# Patient Record
Sex: Male | Born: 1965 | Race: Black or African American | Hispanic: No | Marital: Single | Smoking: Former smoker
Health system: Southern US, Community
[De-identification: ages and names within clinical notes are randomized; demographics above are authoritative.]

## PROBLEM LIST (undated history)

## (undated) DIAGNOSIS — E119 Type 2 diabetes mellitus without complications: Secondary | ICD-10-CM

## (undated) DIAGNOSIS — E785 Hyperlipidemia, unspecified: Secondary | ICD-10-CM

## (undated) DIAGNOSIS — I10 Essential (primary) hypertension: Secondary | ICD-10-CM

## (undated) DIAGNOSIS — F32A Depression, unspecified: Secondary | ICD-10-CM

## (undated) HISTORY — DX: Type 2 diabetes mellitus without complications: E11.9

## (undated) HISTORY — PX: CIRCUMCISION: SUR203

## (undated) HISTORY — DX: Hyperlipidemia, unspecified: E78.5

## (undated) HISTORY — DX: Essential (primary) hypertension: I10

## (undated) HISTORY — DX: Depression, unspecified: F32.A

---

## 2015-01-18 ENCOUNTER — Encounter: Payer: Self-pay | Admitting: Gastroenterology

## 2015-03-08 ENCOUNTER — Ambulatory Visit: Payer: Self-pay | Admitting: *Deleted

## 2015-03-10 ENCOUNTER — Encounter: Payer: Self-pay | Admitting: Emergency Medicine

## 2015-03-22 ENCOUNTER — Encounter: Payer: Self-pay | Admitting: Gastroenterology

## 2016-11-05 ENCOUNTER — Ambulatory Visit (INDEPENDENT_AMBULATORY_CARE_PROVIDER_SITE_OTHER): Payer: BLUE CROSS/BLUE SHIELD | Admitting: Internal Medicine

## 2016-11-05 ENCOUNTER — Encounter: Payer: Self-pay | Admitting: Internal Medicine

## 2016-11-05 VITALS — BP 138/90 | HR 63 | Temp 97.4°F

## 2016-11-05 DIAGNOSIS — E785 Hyperlipidemia, unspecified: Secondary | ICD-10-CM

## 2016-11-05 DIAGNOSIS — Z125 Encounter for screening for malignant neoplasm of prostate: Secondary | ICD-10-CM | POA: Diagnosis not present

## 2016-11-05 DIAGNOSIS — I10 Essential (primary) hypertension: Secondary | ICD-10-CM | POA: Diagnosis not present

## 2016-11-05 DIAGNOSIS — E119 Type 2 diabetes mellitus without complications: Secondary | ICD-10-CM | POA: Diagnosis not present

## 2016-11-05 DIAGNOSIS — Z0000 Encounter for general adult medical examination without abnormal findings: Secondary | ICD-10-CM

## 2016-11-05 LAB — CBC WITH DIFFERENTIAL/PLATELET
BASOS ABS: 0 {cells}/uL (ref 0–200)
Basophils Relative: 0 %
EOS PCT: 2 %
Eosinophils Absolute: 136 cells/uL (ref 15–500)
HCT: 43.4 % (ref 38.5–50.0)
HEMOGLOBIN: 14.3 g/dL (ref 13.2–17.1)
Lymphocytes Relative: 50 %
Lymphs Abs: 3400 cells/uL (ref 850–3900)
MCH: 27.3 pg (ref 27.0–33.0)
MCHC: 32.9 g/dL (ref 32.0–36.0)
MCV: 83 fL (ref 80.0–100.0)
MONOS PCT: 6 %
MPV: 11.5 fL (ref 7.5–12.5)
Monocytes Absolute: 408 cells/uL (ref 200–950)
NEUTROS ABS: 2856 {cells}/uL (ref 1500–7800)
Neutrophils Relative %: 42 %
PLATELETS: 170 10*3/uL (ref 140–400)
RBC: 5.23 MIL/uL (ref 4.20–5.80)
RDW: 14 % (ref 11.0–15.0)
WBC: 6.8 10*3/uL (ref 3.8–10.8)

## 2016-11-05 LAB — HEMOCCULT GUIAC POC 1CARD (OFFICE): Fecal Occult Blood, POC: POSITIVE — AB

## 2016-11-05 LAB — POCT URINALYSIS DIPSTICK
BILIRUBIN UA: NEGATIVE
Blood, UA: NEGATIVE
GLUCOSE UA: NEGATIVE
Ketones, UA: NEGATIVE
Leukocytes, UA: NEGATIVE
NITRITE UA: NEGATIVE
Protein, UA: NEGATIVE
Spec Grav, UA: 1.015 (ref 1.010–1.025)
Urobilinogen, UA: 0.2 E.U./dL
pH, UA: 6 (ref 5.0–8.0)

## 2016-11-05 MED ORDER — TRESIBA FLEXTOUCH 100 UNIT/ML ~~LOC~~ SOPN: 30.0000 [IU] | PEN_INJECTOR | Freq: Every day | SUBCUTANEOUS | 11 refills | Status: DC

## 2016-11-05 MED ORDER — ASPIRIN EC 81 MG PO TBEC: 81.0000 mg | DELAYED_RELEASE_TABLET | Freq: Every day | ORAL | 3 refills | Status: DC

## 2016-11-05 MED ORDER — ATORVASTATIN CALCIUM 80 MG PO TABS: 80.0000 mg | ORAL_TABLET | Freq: Every day | ORAL | 3 refills | Status: DC

## 2016-11-05 MED ORDER — LISINOPRIL-HYDROCHLOROTHIAZIDE 20-25 MG PO TABS
1.0000 | ORAL_TABLET | Freq: Every day | ORAL | 3 refills | Status: DC
Start: 2016-11-05 — End: 2017-06-07

## 2016-11-05 NOTE — Patient Instructions (Signed)
Please keep a record of  home blood pressure readings and bring with you at next visit. Return in 2 weeks. Labs drawn and pending. Please have diabetic eye exam.

## 2016-11-05 NOTE — Progress Notes (Signed)
Subjective:    Patient ID: Troy Gordon, male    DOB: 1965/08/29, 51 y.o.   MRN: [id number]  HPI  First visit for this 51 year old Black Male who moved here about 2 years ago from [place].  He has a history of hyperlipidemia, hypertension and  type 2 diabetes mellitus. Doesn't check Accu-Cheks on a regular basis. Does it about every other day. Talked with him about this. Written prescription for new home glucose monitor and lancets.  Says the last time he had hemoglobin A1c drawn, he thought it was in the 9% range. Doesn't bring any old records with him today. Says blood pressure is much better at home and he takes it several times a week. I've asked him to keep up with this on a daily basis and return in 2 weeks with blood pressure readings.  Medications include Zestoretic 20/25 daily, Lipitor 80 mg daily, Tresiba Flex touch 30 units daily at 10 PM. Also takes 81 mg of aspirin daily.  Social history: He is divorced. Has a high school education. Resides alone. 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 He works 12 hour days 5 days a week from 3 PM to 3 AM. He tries to get exercise with weight training and the [employer]. He does not smoke or consume alcohol nor use illicit drugs. One son who is an adult 61.  Family history: He was raised by someone other than his parents. He doesn't know much about his parents' family history is said they are both living and have history of hypertension. He does not think that either one has diabetes. Maternal grandmother did have diabetes. Has one brother apparently in good health and 6 sisters. One sister may have had cancer but he doesn't know what type. She also has arthritis. Another sister is overweight. Not much else known about his sisters. Son is in good health.  Was diagnosed with diabetes around 2009. No history of hospitalizations or operations. No known drug  allergies.    Review of Systems  Constitutional: Negative.   Eyes: Negative.   Respiratory: Negative.   Cardiovascular: Negative.   Gastrointestinal: Negative.   Genitourinary: Negative.   Musculoskeletal: Positive for back pain.  Neurological: Negative.   Psychiatric/Behavioral: Negative.        Objective:   Physical Exam  Constitutional: He is oriented to person, place, and time. He appears well-developed and well-nourished. No distress.  HENT:  Head: Normocephalic and atraumatic.  Right Ear: External ear normal.  Left Ear: External ear normal.  Mouth/Throat: Oropharynx is clear and moist. No oropharyngeal exudate.  Eyes: Pupils are equal, round, and reactive to light. Conjunctivae and EOM are normal. Right eye exhibits no discharge. Left eye exhibits no discharge. No scleral icterus.  Neck: Neck supple. No JVD present. No thyromegaly present.  Cardiovascular: Normal rate, regular rhythm, normal heart sounds and intact distal pulses.   No murmur heard. Pulmonary/Chest: Breath sounds normal. No respiratory distress. He has no wheezes. He has no rales. He exhibits no tenderness.  Abdominal: Soft. Bowel sounds are normal. He exhibits no distension and no mass. There is no tenderness. There is no rebound and no guarding.  Genitourinary:  Genitourinary Comments: Prostate normal without nodules  Musculoskeletal: He exhibits no edema.  Lymphadenopathy:    He has no cervical adenopathy.  Neurological: He is alert and oriented to person, place, and time. He has normal reflexes. No  cranial nerve deficit. Coordination normal.  Skin: Skin is warm and dry. No rash noted. He is not diaphoretic.  Psychiatric: He has a normal mood and affect. His behavior is normal. Judgment and thought content normal.  Vitals reviewed.         Assessment & Plan:  Type 2 diabetes mellitus on Tresiba- Hgb AIC pending  Essential hypertension-monitor over the next 2 weeks at home and return for  follow-up and bring home readings  Hyperlipidemia-fasting labs drawn and pending along with hemoglobin A1c and urine for microalbuminuria  Plan: He declines flu vaccine. Return in 2 weeks with office visit and bring blood pressure readings from home. Also bring Accu-Chek readings. He may benefit from endocrinology consult. Recommend annual diabetic eye exam.May need adjustment in BP med. Reminded about diabetic eye exam. Ask about prevnar 13 at next visit. Declines flu vaccine.

## 2016-11-06 LAB — COMPLETE METABOLIC PANEL WITH GFR
ALT: 15 U/L (ref 9–46)
AST: 15 U/L (ref 10–35)
Albumin: 3.9 g/dL (ref 3.6–5.1)
Alkaline Phosphatase: 69 U/L (ref 40–115)
BILIRUBIN TOTAL: 0.4 mg/dL (ref 0.2–1.2)
BUN: 24 mg/dL (ref 7–25)
CO2: 27 mmol/L (ref 20–32)
Calcium: 9.2 mg/dL (ref 8.6–10.3)
Chloride: 102 mmol/L (ref 98–110)
Creat: 1.32 mg/dL (ref 0.70–1.33)
GFR, EST NON AFRICAN AMERICAN: 62 mL/min (ref >=60)
GFR, Est African American: 71 mL/min (ref >=60)
GLUCOSE: 120 mg/dL — AB (ref 65–99)
POTASSIUM: 4 mmol/L (ref 3.5–5.3)
SODIUM: 141 mmol/L (ref 135–146)
TOTAL PROTEIN: 6.7 g/dL (ref 6.1–8.1)

## 2016-11-06 LAB — LIPID PANEL
Cholesterol: 237 mg/dL — ABNORMAL HIGH (ref <200)
HDL: 42 mg/dL (ref >40)
LDL CALC: 153 mg/dL — AB (ref <100)
Total CHOL/HDL Ratio: 5.6 Ratio — ABNORMAL HIGH (ref <5.0)
Triglycerides: 210 mg/dL — ABNORMAL HIGH (ref <150)
VLDL: 42 mg/dL — ABNORMAL HIGH (ref <30)

## 2016-11-06 LAB — MICROALBUMIN / CREATININE URINE RATIO
Creatinine, Urine: 169 mg/dL (ref 20–370)
Microalb Creat Ratio: 8 mcg/mg creat (ref <30)
Microalb, Ur: 1.4 mg/dL

## 2016-11-06 LAB — PSA: PSA: 0.4 ng/mL (ref <=4.0)

## 2016-11-06 LAB — HEMOGLOBIN A1C
HEMOGLOBIN A1C: 9.1 % — AB (ref <5.7)
Mean Plasma Glucose: 214 mg/dL

## 2016-11-09 ENCOUNTER — Encounter: Payer: Self-pay | Admitting: Internal Medicine

## 2016-11-20 ENCOUNTER — Ambulatory Visit (INDEPENDENT_AMBULATORY_CARE_PROVIDER_SITE_OTHER): Payer: BLUE CROSS/BLUE SHIELD | Admitting: Internal Medicine

## 2016-11-20 ENCOUNTER — Encounter: Payer: Self-pay | Admitting: Internal Medicine

## 2016-11-20 VITALS — BP 138/96 | HR 68 | Temp 97.5°F | Wt 201.0 lb

## 2016-11-20 DIAGNOSIS — Z794 Long term (current) use of insulin: Secondary | ICD-10-CM

## 2016-11-20 DIAGNOSIS — I10 Essential (primary) hypertension: Secondary | ICD-10-CM

## 2016-11-20 DIAGNOSIS — E119 Type 2 diabetes mellitus without complications: Secondary | ICD-10-CM

## 2016-11-20 DIAGNOSIS — E785 Hyperlipidemia, unspecified: Secondary | ICD-10-CM | POA: Diagnosis not present

## 2016-11-20 DIAGNOSIS — E1165 Type 2 diabetes mellitus with hyperglycemia: Secondary | ICD-10-CM | POA: Insufficient documentation

## 2016-11-20 MED ORDER — AMLODIPINE BESYLATE 5 MG PO TABS: 5.0000 mg | ORAL_TABLET | Freq: Every day | ORAL | 3 refills | Status: DC

## 2016-11-20 NOTE — Patient Instructions (Signed)
Add Norvasc 5 mg daily to Prinzide. Return in 3-4 weeks. Referral made to endocrinologist. Flu vaccine declined.

## 2016-11-20 NOTE — Progress Notes (Signed)
   Subjective:    Patient ID: Troy Gordon, male    DOB: 08/13/1965, 51 y.o.   MRN: [id number]  HPI  51  year old Male seen here as a new patient for the first time on August 27. Hemoglobin A1c was 9.1% at that time. He's been drinking too smoothies a day that he purchases from [employer] and prepare's himself. He does not know how many calories are in the smoothies. We went over his dietary history. He tries to eat fairly well with meat and vegetables. Knows to avoid bread. I'm not sure he realized that too much fruit could worsen his diabetes. He will check on the calories contained in the smoothies. He needs a new home glucose monitor. We gave him a written prescription at last visit but drugstore would not fill that prescription. We are calling to check on that.  He declines flu vaccine  His blood pressure control could be a bit better. It is 138/96 today. He is on Prinzide 20/25 daily.  Review of Systemsseeing above-no new complaints      Objective:   Physical Exam Skin warm and dry. Neck is supple without JVD thyromegaly or bruits. Chest clear. Cardiac exam regular rate and rhythm normal S1 and S2. Extremities without edema        Assessment & Plan:    Type 2 diabetes mellitus-hemoglobin A1c 9.1% could be improved  Essential hypertension-add Norvasc 5 mg daily and follow-up in 3-4 weeks.  Hyperlipidemia-he is on maximum Lipitor therapy  Plan: Referral to endocrinologist. Follow-up on blood pressure here in 3-4 weeks. Declines flu vaccine. Reminded about diabetic eye exam.

## 2016-11-30 LAB — HM DIABETES EYE EXAM

## 2016-12-20 ENCOUNTER — Ambulatory Visit: Payer: BLUE CROSS/BLUE SHIELD | Admitting: Internal Medicine

## 2016-12-28 ENCOUNTER — Encounter: Payer: Self-pay | Admitting: Internal Medicine

## 2016-12-28 ENCOUNTER — Ambulatory Visit (INDEPENDENT_AMBULATORY_CARE_PROVIDER_SITE_OTHER): Payer: BLUE CROSS/BLUE SHIELD | Admitting: Internal Medicine

## 2016-12-28 VITALS — BP 124/80 | HR 70 | Temp 97.4°F | Wt 200.0 lb

## 2016-12-28 DIAGNOSIS — E119 Type 2 diabetes mellitus without complications: Secondary | ICD-10-CM

## 2016-12-28 DIAGNOSIS — I10 Essential (primary) hypertension: Secondary | ICD-10-CM | POA: Diagnosis not present

## 2016-12-28 DIAGNOSIS — E785 Hyperlipidemia, unspecified: Secondary | ICD-10-CM

## 2016-12-28 NOTE — Progress Notes (Signed)
   Subjective:    Patient ID: Troy Gordon, male    DOB: 03/16/1965, 51 y.o.   MRN: [id number]  HPI For follow up on on hyperlipidemia and essential hypertension. Last visit, amlodipine was added to his hypertensive regimen. His blood pressure is much improved.  With regard to his diet, he stopped preparing smoothies. Thinks that they had a lot of carbohydrates. He's changed the way he is eating a bit and trying to get plenty of proteins and less calories.  On his initial lab work total cholesterol was 237 and triglycerides 210 despite maximum dose of statin medication. LDL cholesterol was 153. He has appointment soon to see Dr. Lucianne MussKumar.His Hemoglobin AIC was 9.1% in August.  He had recent eye exam that showed some mild hypertensive retinopathy but no diabetic retinopathy.  He declines flu vaccine    Review of Systems see above     Objective:   Physical Exam Blood pressure is excellent at 124/80 pulse is 70. Chest clear. Cardiac exam regular rate and rhythm. Extremities without edema.       Assessment & Plan:  Essential hypertension  Diabetes mellitus  Hyperlipidemia  Plan: Continue current medications. See Dr. Lucianne MussKumar. Return late February 2019. Asked patient to complete 3 Hemoccult cards at home off aspirin therapy.

## 2016-12-28 NOTE — Patient Instructions (Addendum)
I am pleased with blood pressure results. Continue same medications. See Dr. Lucianne MussKumar for diabetic evaluation and return here in February for six-month recheck. Patient declines flu vaccine. Asked patient to complete 3 Hemoccult cards at home off aspirin therapy.

## 2017-01-10 ENCOUNTER — Ambulatory Visit (INDEPENDENT_AMBULATORY_CARE_PROVIDER_SITE_OTHER): Payer: BLUE CROSS/BLUE SHIELD | Admitting: Internal Medicine

## 2017-01-10 DIAGNOSIS — Z1211 Encounter for screening for malignant neoplasm of colon: Secondary | ICD-10-CM

## 2017-01-10 LAB — HEMOCCULT GUIAC POC 1CARD (OFFICE)
FECAL OCCULT BLD: NEGATIVE
FECAL OCCULT BLD: NEGATIVE
Fecal Occult Blood, POC: NEGATIVE

## 2017-01-10 NOTE — Patient Instructions (Signed)
Negative stool cards

## 2017-01-10 NOTE — Progress Notes (Signed)
Stool cards received in mail today and all were negative.

## 2017-01-15 ENCOUNTER — Ambulatory Visit: Payer: Self-pay | Admitting: Endocrinology

## 2017-05-03 ENCOUNTER — Other Ambulatory Visit: Payer: BLUE CROSS/BLUE SHIELD | Admitting: Internal Medicine

## 2017-05-06 ENCOUNTER — Ambulatory Visit: Payer: BLUE CROSS/BLUE SHIELD | Admitting: Internal Medicine

## 2017-05-28 ENCOUNTER — Other Ambulatory Visit: Payer: Self-pay | Admitting: Internal Medicine

## 2017-05-28 DIAGNOSIS — I10 Essential (primary) hypertension: Secondary | ICD-10-CM

## 2017-05-28 DIAGNOSIS — E119 Type 2 diabetes mellitus without complications: Secondary | ICD-10-CM

## 2017-06-04 ENCOUNTER — Other Ambulatory Visit: Payer: BLUE CROSS/BLUE SHIELD | Admitting: Internal Medicine

## 2017-06-04 DIAGNOSIS — E119 Type 2 diabetes mellitus without complications: Secondary | ICD-10-CM | POA: Diagnosis not present

## 2017-06-04 DIAGNOSIS — I10 Essential (primary) hypertension: Secondary | ICD-10-CM | POA: Diagnosis not present

## 2017-06-05 LAB — MICROALBUMIN / CREATININE URINE RATIO
CREATININE, URINE: 136 mg/dL (ref 20–320)
MICROALB UR: 1.9 mg/dL
MICROALB/CREAT RATIO: 14 ug/mg{creat} (ref <30)

## 2017-06-05 LAB — LIPID PANEL
CHOLESTEROL: 281 mg/dL — AB (ref <200)
HDL: 51 mg/dL (ref >40)
LDL Cholesterol (Calc): 194 mg/dL (calc) — ABNORMAL HIGH
Non-HDL Cholesterol (Calc): 230 mg/dL (calc) — ABNORMAL HIGH (ref <130)
Total CHOL/HDL Ratio: 5.5 (calc) — ABNORMAL HIGH (ref <5.0)
Triglycerides: 186 mg/dL — ABNORMAL HIGH (ref <150)

## 2017-06-05 LAB — HEPATIC FUNCTION PANEL
AG Ratio: 1.4 (calc) (ref 1.0–2.5)
ALKALINE PHOSPHATASE (APISO): 65 U/L (ref 40–115)
ALT: 23 U/L (ref 9–46)
AST: 20 U/L (ref 10–35)
Albumin: 4.2 g/dL (ref 3.6–5.1)
BILIRUBIN INDIRECT: 0.4 mg/dL (ref 0.2–1.2)
Bilirubin, Direct: 0.1 mg/dL (ref 0.0–0.2)
Globulin: 3 g/dL (calc) (ref 1.9–3.7)
TOTAL PROTEIN: 7.2 g/dL (ref 6.1–8.1)
Total Bilirubin: 0.5 mg/dL (ref 0.2–1.2)

## 2017-06-05 LAB — HEMOGLOBIN A1C
HEMOGLOBIN A1C: 7.4 %{Hb} — AB (ref <5.7)
MEAN PLASMA GLUCOSE: 166 (calc)
eAG (mmol/L): 9.2 (calc)

## 2017-06-07 ENCOUNTER — Ambulatory Visit (INDEPENDENT_AMBULATORY_CARE_PROVIDER_SITE_OTHER): Payer: BLUE CROSS/BLUE SHIELD | Admitting: Internal Medicine

## 2017-06-07 ENCOUNTER — Encounter: Payer: Self-pay | Admitting: Internal Medicine

## 2017-06-07 VITALS — BP 110/80 | HR 58 | Ht 68.0 in | Wt 198.0 lb

## 2017-06-07 DIAGNOSIS — E782 Mixed hyperlipidemia: Secondary | ICD-10-CM | POA: Diagnosis not present

## 2017-06-07 DIAGNOSIS — E785 Hyperlipidemia, unspecified: Secondary | ICD-10-CM

## 2017-06-07 DIAGNOSIS — I10 Essential (primary) hypertension: Secondary | ICD-10-CM

## 2017-06-07 DIAGNOSIS — E1169 Type 2 diabetes mellitus with other specified complication: Secondary | ICD-10-CM | POA: Insufficient documentation

## 2017-06-07 DIAGNOSIS — E119 Type 2 diabetes mellitus without complications: Secondary | ICD-10-CM

## 2017-06-07 MED ORDER — LISINOPRIL-HYDROCHLOROTHIAZIDE 20-25 MG PO TABS: 1.0000 | ORAL_TABLET | Freq: Every day | ORAL | 0 refills | Status: DC

## 2017-06-07 MED ORDER — ATORVASTATIN CALCIUM 80 MG PO TABS: 80.0000 mg | ORAL_TABLET | Freq: Every day | ORAL | 1 refills | Status: DC

## 2017-06-07 NOTE — Patient Instructions (Signed)
I am pleased with her hemoglobin A1c results.  Please work on diet and exercise and follow-up with lipid panel in 3 months.  No change in medications.

## 2017-06-07 NOTE — Progress Notes (Signed)
   Subjective:    Patient ID: Troy Gordon, male    DOB: 07/12/1965, 52 y.o.   MRN: [id number]  HPI 52 year old Black Male for hyperlipidemia and in today to follow-up diabetes mellitus.  He is now cooking more for himself rather than eating out.  His hemoglobin A1c has improved from 9.1% to 7.4% which is excellent.  However with regard to hyperlipidemia, he is on Lipitor 80 mg daily.  Total cholesterol has increased from 237-281.  Triglycerides have decreased from 210-186 and LDL cholesterol has increased from 153-194.  We went over his diet and I am not sure other than using coconut oil and eating some fried eggs what is causing this problem.  It was certainly better several months ago but not normal.  He spent furloughed at work and that may continue some throughout the summer.  He is joined a gym and is trying to get more exercise.  For now, we are going to leave him on same medications and he will follow-up in 3 months with lipid panel only.  His physical exam is due in 6 months.  His blood pressure is excellent at 110/80.    Review of Systems see above no new complaints     Objective:   Physical Exam Skin warm and dry.  Nodes none.  Neck is supple.  No JVD thyromegaly or carotid bruits.  Chest clear.  Cardiac exam regular rate and rhythm normal S1 and S2.  Extremities without edema.       Assessment & Plan:  Type 2 diabetes mellitus without complication-improved considerably  Hyperlipidemia on maximum dose of Lipitor is actually a bit worse and it is not clear to me other than some dietary indiscretion what is causing this.  He needs to get more exercise.  His weight is pretty good right now.  BMI is 30.11.  Recheck in 3 months.  Health maintenance-he declines pneumococcal immunizations  Plan: He will return in 3 months for lipid panel only and return in 6 months for his annual physical exam.

## 2017-08-26 ENCOUNTER — Other Ambulatory Visit: Payer: Self-pay | Admitting: Internal Medicine

## 2017-08-26 DIAGNOSIS — E782 Mixed hyperlipidemia: Secondary | ICD-10-CM

## 2017-09-02 ENCOUNTER — Ambulatory Visit (INDEPENDENT_AMBULATORY_CARE_PROVIDER_SITE_OTHER): Payer: BLUE CROSS/BLUE SHIELD | Admitting: Internal Medicine

## 2017-09-02 ENCOUNTER — Encounter: Payer: Self-pay | Admitting: Internal Medicine

## 2017-09-02 VITALS — BP 130/90 | HR 70 | Temp 98.1°F | Ht 68.0 in | Wt 198.0 lb

## 2017-09-02 DIAGNOSIS — I10 Essential (primary) hypertension: Secondary | ICD-10-CM | POA: Diagnosis not present

## 2017-09-02 DIAGNOSIS — E782 Mixed hyperlipidemia: Secondary | ICD-10-CM

## 2017-09-02 DIAGNOSIS — E119 Type 2 diabetes mellitus without complications: Secondary | ICD-10-CM

## 2017-09-02 DIAGNOSIS — E1169 Type 2 diabetes mellitus with other specified complication: Secondary | ICD-10-CM

## 2017-09-02 DIAGNOSIS — E785 Hyperlipidemia, unspecified: Secondary | ICD-10-CM

## 2017-09-02 LAB — LIPID PANEL
Cholesterol: 218 mg/dL — ABNORMAL HIGH (ref <200)
HDL: 44 mg/dL (ref >40)
LDL Cholesterol (Calc): 140 mg/dL (calc) — ABNORMAL HIGH
NON-HDL CHOLESTEROL (CALC): 174 mg/dL — AB (ref <130)
Total CHOL/HDL Ratio: 5 (calc) — ABNORMAL HIGH (ref <5.0)
Triglycerides: 198 mg/dL — ABNORMAL HIGH (ref <150)

## 2017-09-02 MED ORDER — TRESIBA FLEXTOUCH 100 UNIT/ML ~~LOC~~ SOPN
30.0000 [IU] | PEN_INJECTOR | Freq: Every day | SUBCUTANEOUS | 11 refills | Status: DC
Start: 2017-09-02 — End: 2017-10-03

## 2017-09-02 MED ORDER — AMLODIPINE BESYLATE 5 MG PO TABS: 5.0000 mg | ORAL_TABLET | Freq: Every day | ORAL | 0 refills | Status: DC

## 2017-09-02 MED ORDER — ATORVASTATIN CALCIUM 80 MG PO TABS: 80.0000 mg | ORAL_TABLET | Freq: Every day | ORAL | 1 refills | Status: DC

## 2017-09-02 MED ORDER — LISINOPRIL-HYDROCHLOROTHIAZIDE 20-25 MG PO TABS: 1.0000 | ORAL_TABLET | Freq: Every day | ORAL | 0 refills | Status: DC

## 2017-09-02 NOTE — Progress Notes (Signed)
   Subjective:    Patient ID: Troy Gordon, male    DOB: 02/12/1966, 52 y.o.   MRN: [id number]  HPI in today to follow-up on hyperlipidemia.  At last visit in March total cholesterol was 281 with triglycerides of 186 and an LDL cholesterol of 194.  He is on atorvastatin 80 mg daily.  He may have missed some doses around the time his labs were checked in March.  Says is been trying to eat better and taking his medication on a daily basis.  His blood pressure is elevated at 130/ on arrival90.  I have not changed his antihypertensive medication today.  Says his Accu-Cheks are running about 130.  He has follow-up appointment to address all of these issues in September.  He was mainly here today for follow-up on hyperlipidemia.    Review of Systems see above     Objective:   Physical Exam Skin warm and dry.  Chest clear to auscultation.  Cardiac exam regular rate and rhythm.Ext without edema. Blood pressure repeated was 130/88 in each arm.        Assessment & Plan:  Mixed hyperlipidemia  Plan: Lab work drawn and pending with further instructions to follow.  Keep appointment for follow-up in early September for CPE.

## 2017-09-02 NOTE — Patient Instructions (Signed)
Fasting lipid panel drawn with results pending.  Continue same antihypertensive medication.  Follow-up in September.

## 2017-09-05 ENCOUNTER — Telehealth: Payer: Self-pay | Admitting: Internal Medicine

## 2017-09-05 NOTE — Telephone Encounter (Signed)
Suggest he have fasting lipid panel and Hgb AIC week of July 8

## 2017-09-05 NOTE — Telephone Encounter (Signed)
Patient will come for fasting labs for Lipid, A1C on Tuesday, 7/9 @ 9:15 a.m.  Patient confirmed.

## 2017-09-05 NOTE — Telephone Encounter (Signed)
Patient called and states that he got confused on his labs.  He forgot that he had to fast on his lipids instead of his A1C.  He was eating up to 2 or 3 a.m.  He thinks this may have been the reason his labs were really messed up.  He says that he had really been dieting and exercising and feels his labs should've been much better.  Wants to know if you want him to come and repeat his labs?  He really would feel better if this was repeated with him fasting as he should've been.  He's sorry as he just got confused on this.    He will be on OT until after the week of July 4th.  The week of July 8th, he will be off all of that week and could come back in for repeat labs any time that week.    Phone #:  [phone]  Thank you.

## 2017-09-06 ENCOUNTER — Other Ambulatory Visit: Payer: Self-pay | Admitting: Internal Medicine

## 2017-09-06 DIAGNOSIS — E782 Mixed hyperlipidemia: Secondary | ICD-10-CM

## 2017-09-06 DIAGNOSIS — E785 Hyperlipidemia, unspecified: Secondary | ICD-10-CM

## 2017-09-06 DIAGNOSIS — E1169 Type 2 diabetes mellitus with other specified complication: Secondary | ICD-10-CM

## 2017-09-17 ENCOUNTER — Other Ambulatory Visit: Payer: BLUE CROSS/BLUE SHIELD | Admitting: Internal Medicine

## 2017-09-17 DIAGNOSIS — E782 Mixed hyperlipidemia: Secondary | ICD-10-CM | POA: Diagnosis not present

## 2017-09-17 DIAGNOSIS — E785 Hyperlipidemia, unspecified: Secondary | ICD-10-CM | POA: Diagnosis not present

## 2017-09-17 DIAGNOSIS — E1169 Type 2 diabetes mellitus with other specified complication: Secondary | ICD-10-CM

## 2017-09-18 LAB — LIPID PANEL
Cholesterol: 191 mg/dL (ref <200)
HDL: 45 mg/dL (ref >40)
LDL Cholesterol (Calc): 118 mg/dL (calc) — ABNORMAL HIGH
NON-HDL CHOLESTEROL (CALC): 146 mg/dL — AB (ref <130)
Total CHOL/HDL Ratio: 4.2 (calc) (ref <5.0)
Triglycerides: 167 mg/dL — ABNORMAL HIGH (ref <150)

## 2017-09-18 LAB — HEMOGLOBIN A1C
EAG (MMOL/L): 9 (calc)
HEMOGLOBIN A1C: 7.3 %{Hb} — AB (ref <5.7)
MEAN PLASMA GLUCOSE: 163 (calc)

## 2017-09-25 ENCOUNTER — Encounter: Payer: Self-pay | Admitting: Internal Medicine

## 2017-09-25 DIAGNOSIS — E119 Type 2 diabetes mellitus without complications: Secondary | ICD-10-CM | POA: Diagnosis not present

## 2017-09-25 LAB — HM DIABETES EYE EXAM

## 2017-10-03 ENCOUNTER — Other Ambulatory Visit: Payer: Self-pay

## 2017-10-03 MED ORDER — TRESIBA FLEXTOUCH 100 UNIT/ML ~~LOC~~ SOPN: 30.0000 [IU] | PEN_INJECTOR | Freq: Every day | SUBCUTANEOUS | 11 refills | Status: DC

## 2017-10-03 MED ORDER — LISINOPRIL-HYDROCHLOROTHIAZIDE 20-25 MG PO TABS: 1.0000 | ORAL_TABLET | Freq: Every day | ORAL | 3 refills | Status: DC

## 2017-10-03 MED ORDER — ATORVASTATIN CALCIUM 80 MG PO TABS: 80.0000 mg | ORAL_TABLET | Freq: Every day | ORAL | 3 refills | Status: DC

## 2017-11-15 ENCOUNTER — Other Ambulatory Visit: Payer: Self-pay | Admitting: Internal Medicine

## 2017-11-15 DIAGNOSIS — E782 Mixed hyperlipidemia: Secondary | ICD-10-CM

## 2017-11-15 DIAGNOSIS — Z125 Encounter for screening for malignant neoplasm of prostate: Secondary | ICD-10-CM

## 2017-11-15 DIAGNOSIS — E119 Type 2 diabetes mellitus without complications: Secondary | ICD-10-CM

## 2017-11-15 DIAGNOSIS — Z0000 Encounter for general adult medical examination without abnormal findings: Secondary | ICD-10-CM

## 2017-11-15 DIAGNOSIS — I10 Essential (primary) hypertension: Secondary | ICD-10-CM

## 2017-11-18 ENCOUNTER — Other Ambulatory Visit: Payer: BLUE CROSS/BLUE SHIELD | Admitting: Internal Medicine

## 2017-11-18 DIAGNOSIS — I10 Essential (primary) hypertension: Secondary | ICD-10-CM | POA: Diagnosis not present

## 2017-11-18 DIAGNOSIS — E782 Mixed hyperlipidemia: Secondary | ICD-10-CM | POA: Diagnosis not present

## 2017-11-18 DIAGNOSIS — E119 Type 2 diabetes mellitus without complications: Secondary | ICD-10-CM | POA: Diagnosis not present

## 2017-11-18 DIAGNOSIS — Z125 Encounter for screening for malignant neoplasm of prostate: Secondary | ICD-10-CM

## 2017-11-18 DIAGNOSIS — Z0000 Encounter for general adult medical examination without abnormal findings: Secondary | ICD-10-CM

## 2017-11-19 LAB — MICROALBUMIN / CREATININE URINE RATIO
Creatinine, Urine: 224 mg/dL (ref 20–320)
MICROALB/CREAT RATIO: 18 ug/mg{creat} (ref <30)
Microalb, Ur: 4.1 mg/dL

## 2017-11-19 LAB — CBC WITH DIFFERENTIAL/PLATELET
BASOS ABS: 20 {cells}/uL (ref 0–200)
Basophils Relative: 0.3 %
Eosinophils Absolute: 150 cells/uL (ref 15–500)
Eosinophils Relative: 2.2 %
HEMATOCRIT: 43 % (ref 38.5–50.0)
Hemoglobin: 14.1 g/dL (ref 13.2–17.1)
Lymphs Abs: 2618 cells/uL (ref 850–3900)
MCH: 26.6 pg — ABNORMAL LOW (ref 27.0–33.0)
MCHC: 32.8 g/dL (ref 32.0–36.0)
MCV: 81 fL (ref 80.0–100.0)
MPV: 13 fL — ABNORMAL HIGH (ref 7.5–12.5)
Monocytes Relative: 5 %
NEUTROS PCT: 54 %
Neutro Abs: 3672 cells/uL (ref 1500–7800)
PLATELETS: 157 10*3/uL (ref 140–400)
RBC: 5.31 10*6/uL (ref 4.20–5.80)
RDW: 13.2 % (ref 11.0–15.0)
TOTAL LYMPHOCYTE: 38.5 %
WBC mixed population: 340 cells/uL (ref 200–950)
WBC: 6.8 10*3/uL (ref 3.8–10.8)

## 2017-11-19 LAB — COMPLETE METABOLIC PANEL WITH GFR
AG RATIO: 1.4 (calc) (ref 1.0–2.5)
ALBUMIN MSPROF: 4 g/dL (ref 3.6–5.1)
ALKALINE PHOSPHATASE (APISO): 75 U/L (ref 40–115)
ALT: 19 U/L (ref 9–46)
AST: 16 U/L (ref 10–35)
BILIRUBIN TOTAL: 0.4 mg/dL (ref 0.2–1.2)
BUN / CREAT RATIO: 16 (calc) (ref 6–22)
BUN: 22 mg/dL (ref 7–25)
CHLORIDE: 101 mmol/L (ref 98–110)
CO2: 29 mmol/L (ref 20–32)
Calcium: 9.6 mg/dL (ref 8.6–10.3)
Creat: 1.38 mg/dL — ABNORMAL HIGH (ref 0.70–1.33)
GFR, EST AFRICAN AMERICAN: 67 mL/min/{1.73_m2} (ref >=60)
GFR, Est Non African American: 58 mL/min/{1.73_m2} — ABNORMAL LOW (ref >=60)
GLOBULIN: 2.9 g/dL (ref 1.9–3.7)
GLUCOSE: 190 mg/dL — AB (ref 65–99)
POTASSIUM: 4.1 mmol/L (ref 3.5–5.3)
SODIUM: 140 mmol/L (ref 135–146)
TOTAL PROTEIN: 6.9 g/dL (ref 6.1–8.1)

## 2017-11-19 LAB — HEMOGLOBIN A1C
Hgb A1c MFr Bld: 8.8 % of total Hgb — ABNORMAL HIGH (ref <5.7)
Mean Plasma Glucose: 206 (calc)
eAG (mmol/L): 11.4 (calc)

## 2017-11-19 LAB — PSA: PSA: 0.5 ng/mL (ref <=4.0)

## 2017-11-19 LAB — LIPID PANEL
Cholesterol: 186 mg/dL (ref <200)
HDL: 43 mg/dL (ref >40)
LDL CHOLESTEROL (CALC): 112 mg/dL — AB
Non-HDL Cholesterol (Calc): 143 mg/dL (calc) — ABNORMAL HIGH (ref <130)
TRIGLYCERIDES: 194 mg/dL — AB (ref <150)
Total CHOL/HDL Ratio: 4.3 (calc) (ref <5.0)

## 2017-11-22 ENCOUNTER — Encounter: Payer: Self-pay | Admitting: Internal Medicine

## 2017-11-22 ENCOUNTER — Ambulatory Visit (INDEPENDENT_AMBULATORY_CARE_PROVIDER_SITE_OTHER): Payer: BLUE CROSS/BLUE SHIELD | Admitting: Internal Medicine

## 2017-11-22 VITALS — BP 110/82 | HR 84 | Ht 68.0 in | Wt 197.0 lb

## 2017-11-22 DIAGNOSIS — E119 Type 2 diabetes mellitus without complications: Secondary | ICD-10-CM

## 2017-11-22 DIAGNOSIS — I10 Essential (primary) hypertension: Secondary | ICD-10-CM | POA: Diagnosis not present

## 2017-11-22 DIAGNOSIS — Z23 Encounter for immunization: Secondary | ICD-10-CM

## 2017-11-22 DIAGNOSIS — E782 Mixed hyperlipidemia: Secondary | ICD-10-CM

## 2017-11-22 DIAGNOSIS — Z0000 Encounter for general adult medical examination without abnormal findings: Secondary | ICD-10-CM | POA: Diagnosis not present

## 2017-11-22 LAB — POCT URINALYSIS DIPSTICK
APPEARANCE: NORMAL
BILIRUBIN UA: NEGATIVE
Blood, UA: NEGATIVE
Glucose, UA: NEGATIVE
Ketones, UA: NEGATIVE
Leukocytes, UA: NEGATIVE
Nitrite, UA: NEGATIVE
ODOR: NORMAL
Protein, UA: NEGATIVE
Spec Grav, UA: 1.015 (ref 1.010–1.025)
Urobilinogen, UA: 0.2 E.U./dL
pH, UA: 6.5 (ref 5.0–8.0)

## 2017-11-22 MED ORDER — FENOFIBRATE 160 MG PO TABS: 160.0000 mg | ORAL_TABLET | Freq: Every day | ORAL | 3 refills | Status: DC

## 2017-11-22 NOTE — Patient Instructions (Addendum)
Add fenofibrate to statin medication and follow-up in 3 months.  Continue to work on diabetic control.  Prevnar 13 given today.

## 2017-11-22 NOTE — Progress Notes (Signed)
Subjective:    Patient ID: Troy Gordon, male    DOB: 03/23/1965, 52 y.o.   MRN: [id number]  HPI 52 year old Black Male for health maintenance exam and evaluation of medical issues.  First presented to the office August 2018.  First presented to office August 2018.  He moved here about 3 years ago from [place].  History of hyperlipidemia, hypertension and type 2 diabetes mellitus.  Has tried to take better care of diabetes over the past year.  However 2 months ago hemoglobin A1c was 7.3% and is now 8.8%.  Triglycerides are 194 and 2 months ago were 167.  LDL cholesterol is 112 and 2 months ago was 114 with normal total cholesterol each time.  HDL cholesterol in the low 40s.  PSA is normal.  Fasting glucose 190 with creatinine 1.38.  Creatinine 1 year ago was 1.32.  No glucose or protein in urine.  PSA is within normal limits.  CBC and liver function stable.  Social history: He is divorced.  Has a high school education.  Resides alone.  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  Generally works 12-hour days 5 days a week from 3 PM to 3 AM.  Has had some layoffs recently.  Tries to get exercise with weight training and using [employer].  Does not smoke or consume alcohol nor use illicit drugs.  Has an adult son in his early 4830s.  Family history: He was raised by someone other than his parents and does not know much about his parents family history but says they are both living with history of hypertension.  He does not think that either one his diabetes.  Maternal grandmother did have diabetes.  Has 1 brother apparently in good health and 6 sisters.  One sister may have a cancer but he does not know what type.  She also has arthritis.  Another sister is overweight.  Not much else is known about sisters.  Son is in good health.  Was diagnosed with diabetes around 2009.  No history of hospitalizations or  operations.  No known drug allergies    Review of Systems     Objective:   Physical Exam  Constitutional: He is oriented to person, place, and time.  HENT:  Head: Normocephalic and atraumatic.  Right Ear: External ear normal.  Left Ear: External ear normal.  Nose: Nose normal.  Mouth/Throat: Oropharynx is clear and moist. No oropharyngeal exudate.  Eyes: Pupils are equal, round, and reactive to light. Conjunctivae and EOM are normal. Right eye exhibits no discharge. Left eye exhibits no discharge. No scleral icterus.  Neck: Neck supple. No JVD present. No thyromegaly present.  Cardiovascular: Normal heart sounds and intact distal pulses.  No murmur heard. Pulmonary/Chest: Effort normal and breath sounds normal. No stridor. No respiratory distress. He has no wheezes. He has no rales.  Abdominal: Soft. Bowel sounds are normal. He exhibits no distension and no mass. There is no tenderness. There is no rebound and no guarding. No hernia.  Genitourinary: Prostate normal.  Musculoskeletal: He exhibits no edema.  Lymphadenopathy:    He has no cervical adenopathy.  Neurological: He is alert and oriented to person, place, and time. He displays normal reflexes. No cranial nerve deficit or sensory deficit. Coordination normal.  Skin: Skin is warm and dry. No rash noted.  Psychiatric: He has a normal mood and affect. His behavior is normal. Judgment and  thought content normal.  Vitals reviewed.         Assessment & Plan:  Type 2 diabetes mellitus treated with Troy Gordon flex touch 30 units daily at 10 PM  Hyperlipidemia treated with Lipitor-fenofibrate added to Lipitor and follow-up in 3 months   Hypertension treated with Zestoretic 20/25 daily.  We talked about endocrinology referral but he declines at this time.  He is going to continue to work on diet exercise and weight loss and follow-up with me in 3 months.  Reminded about annual diabetic eye exam  Prevnar 13 given today.  We  have no old records from previous providers in [place] or elsewhere.  He will return in 3 months.

## 2018-02-20 ENCOUNTER — Other Ambulatory Visit: Payer: BLUE CROSS/BLUE SHIELD | Admitting: Internal Medicine

## 2018-02-20 ENCOUNTER — Encounter: Payer: Self-pay | Admitting: Internal Medicine

## 2018-02-20 ENCOUNTER — Telehealth: Payer: Self-pay | Admitting: Internal Medicine

## 2018-02-20 NOTE — Telephone Encounter (Signed)
Pt called yesterday to cancel his ppt for labs and did not wish to reschedule

## 2018-02-24 ENCOUNTER — Ambulatory Visit: Payer: BLUE CROSS/BLUE SHIELD | Admitting: Internal Medicine

## 2018-05-12 ENCOUNTER — Other Ambulatory Visit: Payer: BLUE CROSS/BLUE SHIELD | Admitting: Internal Medicine

## 2018-05-12 DIAGNOSIS — E782 Mixed hyperlipidemia: Secondary | ICD-10-CM | POA: Diagnosis not present

## 2018-05-12 DIAGNOSIS — E119 Type 2 diabetes mellitus without complications: Secondary | ICD-10-CM | POA: Diagnosis not present

## 2018-05-13 LAB — LIPID PANEL
CHOL/HDL RATIO: 5.6 (calc) — AB (ref <5.0)
Cholesterol: 213 mg/dL — ABNORMAL HIGH (ref <200)
HDL: 38 mg/dL — ABNORMAL LOW (ref >=40)
LDL Cholesterol (Calc): 140 mg/dL (calc) — ABNORMAL HIGH
NON-HDL CHOLESTEROL (CALC): 175 mg/dL — AB (ref <130)
TRIGLYCERIDES: 208 mg/dL — AB (ref <150)

## 2018-05-13 LAB — HEPATIC FUNCTION PANEL
AG RATIO: 1.5 (calc) (ref 1.0–2.5)
ALT: 27 U/L (ref 9–46)
AST: 18 U/L (ref 10–35)
Albumin: 4.2 g/dL (ref 3.6–5.1)
Alkaline phosphatase (APISO): 51 U/L (ref 35–144)
BILIRUBIN TOTAL: 0.4 mg/dL (ref 0.2–1.2)
Bilirubin, Direct: 0.1 mg/dL (ref 0.0–0.2)
GLOBULIN: 2.8 g/dL (ref 1.9–3.7)
Indirect Bilirubin: 0.3 mg/dL (calc) (ref 0.2–1.2)
Total Protein: 7 g/dL (ref 6.1–8.1)

## 2018-05-13 LAB — HEMOGLOBIN A1C: Hgb A1c MFr Bld: >14 % of total Hgb — ABNORMAL HIGH (ref <5.7)

## 2018-05-16 ENCOUNTER — Ambulatory Visit (INDEPENDENT_AMBULATORY_CARE_PROVIDER_SITE_OTHER): Payer: BLUE CROSS/BLUE SHIELD | Admitting: Internal Medicine

## 2018-05-16 ENCOUNTER — Encounter: Payer: Self-pay | Admitting: Internal Medicine

## 2018-05-16 VITALS — BP 120/80 | HR 84 | Ht 68.0 in | Wt 195.0 lb

## 2018-05-16 DIAGNOSIS — I10 Essential (primary) hypertension: Secondary | ICD-10-CM

## 2018-05-16 DIAGNOSIS — E782 Mixed hyperlipidemia: Secondary | ICD-10-CM

## 2018-05-16 DIAGNOSIS — R7309 Other abnormal glucose: Secondary | ICD-10-CM

## 2018-05-16 DIAGNOSIS — E119 Type 2 diabetes mellitus without complications: Secondary | ICD-10-CM | POA: Diagnosis not present

## 2018-05-17 LAB — BASIC METABOLIC PANEL
BUN / CREAT RATIO: 13 (calc) (ref 6–22)
BUN: 21 mg/dL (ref 7–25)
CHLORIDE: 99 mmol/L (ref 98–110)
CO2: 30 mmol/L (ref 20–32)
CREATININE: 1.59 mg/dL — AB (ref 0.70–1.33)
Calcium: 9.8 mg/dL (ref 8.6–10.3)
Glucose, Bld: 288 mg/dL — ABNORMAL HIGH (ref 65–99)
Potassium: 4.1 mmol/L (ref 3.5–5.3)
SODIUM: 137 mmol/L (ref 135–146)

## 2018-05-20 ENCOUNTER — Ambulatory Visit (INDEPENDENT_AMBULATORY_CARE_PROVIDER_SITE_OTHER): Payer: BLUE CROSS/BLUE SHIELD | Admitting: Internal Medicine

## 2018-05-20 ENCOUNTER — Encounter: Payer: Self-pay | Admitting: Internal Medicine

## 2018-05-20 VITALS — BP 110/80 | HR 78 | Ht 68.0 in | Wt 193.0 lb

## 2018-05-20 DIAGNOSIS — R7301 Impaired fasting glucose: Secondary | ICD-10-CM | POA: Diagnosis not present

## 2018-05-20 DIAGNOSIS — E1165 Type 2 diabetes mellitus with hyperglycemia: Secondary | ICD-10-CM | POA: Diagnosis not present

## 2018-05-20 LAB — POCT GLUCOSE (DEVICE FOR HOME USE): POC GLUCOSE: 284 mg/dL — AB (ref 70–99)

## 2018-05-28 ENCOUNTER — Ambulatory Visit: Payer: Self-pay | Admitting: Internal Medicine

## 2018-05-28 NOTE — Progress Notes (Deleted)
Name: Troy Gordon  MRN/ DOB: [id number], 1965/04/06   Age/ Sex: 53 y.o., male    PCP: Margaree Mackintosh, MD   Reason for Endocrinology Evaluation: Type {NUMBERS 1 OR 2:522190} Diabetes Mellitus     Date of Initial Endocrinology Visit: 05/28/2018     PATIENT IDENTIFIER: Troy Gordon is a 53 y.o. male with a past medical history of ***. The patient presented for initial endocrinology clinic visit on 05/28/2018 for consultative assistance with his diabetes management.    HPI: Troy Gordon was    Diagnosed with T2DM in 2009 Prior Medications tried/Intolerance: *** Currently checking blood sugars *** x / day,  before breakfast and ***.  Hypoglycemia episodes : ***               Symptoms: ***                 Frequency: ***/  Hemoglobin A1c has ranged from *** in ***, peaking at *** in***. Patient required assistance for hypoglycemia:  Patient has required hospitalization within the last 1 year from hyper or hypoglycemia:   In terms of diet, the patient ***   HOME DIABETES REGIMEN: Evaristo [facility]    Statin: yes ACE-I/ARB: yes Prior Diabetic Education: {Yes/No:11203}   METER DOWNLOAD SUMMARY: Date range evaluated: *** Fingerstick Blood Glucose Tests = *** Average Number Tests/Day = *** Overall Mean FS Glucose = *** Standard Deviation = ***  BG Ranges: Low = *** High = ***   Hypoglycemic Events/30 Days: BG < 50 = *** Episodes of symptomatic severe hypoglycemia = ***   DIABETIC COMPLICATIONS: Microvascular complications:   ***  Denies: ***  Last eye exam: Completed   Macrovascular complications:   ***  Denies: CAD, PVD, CVA   PAST HISTORY: Past Medical History:  Past Medical History:  Diagnosis Date  . Hypertension      Past Surgical History: No past surgical history on file.   Social History:  reports that he has never smoked. He has never used smokeless tobacco. No history on file for alcohol and drug.  Family History: No family history on file.    HOME MEDICATIONS: Allergies as of 05/28/2018   No Known Allergies     Medication List       Accurate as of May 28, 2018  8:44 AM. Always use your most recent med list.        atorvastatin 80 MG tablet Commonly known as:  LIPITOR Take 1 tablet (80 mg total) by mouth daily at 6 PM.   fenofibrate 160 MG tablet Take 1 tablet (160 mg total) by mouth daily.   lisinopril-hydrochlorothiazide 20-25 MG tablet Commonly known as:  PRINZIDE,ZESTORETIC Take 1 tablet by mouth daily.   Evaristo Bury FlexTouch 100 UNIT/ML Sopn FlexTouch Pen Generic drug:  insulin degludec Inject 0.3 mLs (30 Units total) into the skin daily at 10 pm.        ALLERGIES: No Known Allergies   REVIEW OF SYSTEMS: A comprehensive ROS was conducted with the patient and is negative except as per HPI and below:  ROS    OBJECTIVE:   VITAL SIGNS: There were no vitals taken for this visit.   PHYSICAL EXAM:  General: Pt appears well and is in NAD  Hydration: Well-hydrated with moist mucous membranes and good skin turgor  HEENT: Head: Unremarkable with good dentition. Oropharynx clear without exudate.  Eyes: External eye exam normal without stare, lid lag or exophthalmos.  EOM intact.  PERRL.  Neck: General: Supple without  adenopathy or carotid bruits. Thyroid: Thyroid size normal.  No goiter or nodules appreciated. No thyroid bruit.  Lungs: Clear with good BS bilat with no rales, rhonchi, or wheezes  Heart: RRR with normal S1 and S2 and no gallops; no murmurs; no rub  Abdomen: Normoactive bowel sounds, soft, nontender, without masses or organomegaly palpable  Extremities:  Lower extremities - No pretibial edema. No lesions.  Skin: Normal texture and temperature to palpation. No rash noted. No Acanthosis nigricans/skin tags. No lipohypertrophy.  Neuro: MS is good with appropriate affect, pt is alert and Ox3    DM foot exam:    DATA REVIEWED:  Lab Results  Component Value Date   HGBA1C >14.0 (H)  05/12/2018   HGBA1C 8.8 (H) 11/18/2017   HGBA1C 7.3 (H) 09/17/2017   Lab Results  Component Value Date   MICROALBUR 4.1 11/18/2017   LDLCALC 140 (H) 05/12/2018   CREATININE 1.59 (H) 05/16/2018   Lab Results  Component Value Date   MICRALBCREAT 18 11/18/2017    Lab Results  Component Value Date   CHOL 213 (H) 05/12/2018   HDL 38 (L) 05/12/2018   LDLCALC 140 (H) 05/12/2018   TRIG 208 (H) 05/12/2018   CHOLHDL 5.6 (H) 05/12/2018        ASSESSMENT / PLAN / RECOMMENDATIONS:   1) Type 2 Diabetes Mellitus, ***controlled, With*** complications - Most recent A1c of *** %. Goal A1c < *** %.  ***  Plan: GENERAL:  ***  MEDICATIONS:  ***  EDUCATION / INSTRUCTIONS:  BG monitoring instructions: Patient is instructed to check his blood sugars *** times a day, ***.  Call [facility] if: BG persistently < 70 or > 300. . I reviewed the Rule of 15 for the treatment of hypoglycemia in detail with the patient. Literature supplied.   2) Diabetic complications:   Eye: Does *** have known diabetic retinopathy.   Neuro/ Feet: Does *** have known diabetic peripheral neuropathy.  Renal: Patient does *** have known baseline CKD. He is *** on an ACEI/ARB at present.   3) Lipids: Patient is *** on a statin.    4) Hypertension: ***  at goal of < 140/90 mmHg.       Signed electronically by: Lyndle Herrlich, MD  [facility]  [facility] [address] [place], [place] [address] Phone: [phone] FAX: [phone]   CC: Margaree Mackintosh, MD [address] [place] [address] Phone: [phone]  Fax: [phone]    Return to Endocrinology clinic as below: Future Appointments  Date Time Provider Department Center  05/28/2018  9:30 AM Deanna Wiater, Konrad Dolores, MD [id number] None

## 2018-06-03 ENCOUNTER — Telehealth: Payer: Self-pay

## 2018-06-03 NOTE — Telephone Encounter (Signed)
Patient called states he is concerned about being infected with COVID-19 "since he is a diabetic" bc his job is requiring him to keep working they will not close and he if he is at high risk he is requesting a letter to be off. Per Dr. Lenord Fellers, patient is to discuss risks with Endo and they can write a letter for him depending on examination. Patient was notified.

## 2018-06-06 ENCOUNTER — Other Ambulatory Visit: Payer: Self-pay

## 2018-06-08 NOTE — Progress Notes (Signed)
   Subjective:    Patient ID: Troy Gordon, male    DOB: [birth year], 53 y.o.   MRN: [id number]  HPI 53 year old Black Male first presented to the office in August 2018.  At that time he had moved to [place] for about 2 years previously from [place].  History of hyperlipidemia, hypertension and type 2 diabetes mellitus.  Was not checking Accu-Cheks on a regular basis.  At the time he was on Zestoretic 20/25 daily, Lipitor 80 mg daily, Tresiba flex touch 30 units daily at 10 PM.  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  He was working 12-hour days 5 days a week from 3 PM to 3 AM.  He does not smoke consume alcohol or use illicit drugs.  He resides alone.  Has 1 adult son.  Maternal grandmother had diabetes.  One brother in good health and 6 sisters.  Not much known about his sisters' health.  Son is in good health.  Was diagnosed with diabetes around 2009.  No history of hospitalizations or operations and no known drug allergies.  He is here today for 7-month recheck.  Hemoglobin A1c is greater than 14%. Liver functions are normal.  Total cholesterol is 213, triglycerides 208, HDL 38 and LDL 140.  Serum glucose was 288 when labs were drawn and is 284 today on Accu-Chek.  Creatinine is elevated at 1.59.  Sodium is 137, potassium 4.1, bicarbonate 30.   Review of Systems urinary frequency and nocturia     Objective:   Physical Exam Neck is supple.  Chest clear.  No carotid bruits.  Cardiac exam regular rate and rhythm normal S1 and S2.  He is in no acute distress.  Extremities without edema.       Assessment & Plan:  Poorly controlled diabetes mellitus-hemoglobin A1c is greater than 14% 5 months ago was 8.8%.  8 months ago was 7.3%  Hyperlipidemia-mixed-- treated with statin  Elevated serum creatinine-likely due to volume depletion  Plan: This is concerning.  Had discussion with him  about this today.  He denies any significant change in his diet.  I am increasing Tresiba 3 units and he will follow-up in a few days.  He will need to be seen by endocrinologist.  I think he is concerned about finances and his work schedule.  However his health needs to take precedence at this time.  Stay well-hydrated.  Call if any concerns or develop other symptoms.  Take Accu-Cheks regularly.

## 2018-06-08 NOTE — Patient Instructions (Signed)
He is to monitor his Accu-Cheks daily 2-3 times daily and keep a record.  I want him to increase Tresiba by another 3 units.

## 2018-06-08 NOTE — Progress Notes (Signed)
   Subjective:    Patient ID: Troy Gordon, male    DOB: [birth year], 53 y.o.   MRN: [id number]  HPI 53 year old Male with poorly controlled diabetes mellitus in today for follow-up.  Last visit I had increased his dose of Tresiba 3 units.  However he still is having significant hyperglycemia.  His recent A1c was greater than 14%.  I have talked with him about a home glucose monitor but I am not sure if he is truly using it.  Accu-Chek today is 284.  I would like for him to increase Troy Gordon again in but he is going to need to see endocrinologist for evaluation and management of his diabetes.  He needs a subspecialist to help manage his diabetes which has not been this poor control since I been seeing him.    Review of Systems see above-he has no nausea vomiting headache or evidence of infection.     Objective:   Physical Exam  Not examined but spent 10 minutes speaking with him today about these issues and he is agreeable to seeing endocrinologist.      Assessment & Plan:  Poorly controlled diabetes mellitus  Plan: Monitor Accu-Cheks carefully.  Call if symptoms develop such as infection, fever, nausea vomiting headache.  Increase Tresiba 3 additional units.  Appointment will be made to see Endocrinologist.

## 2018-06-08 NOTE — Patient Instructions (Signed)
Increase Tresiba 3 units and follow-up.  Watch diet and take Accu-Cheks regularly.  Call if develop nausea vomiting headache fever or chills.

## 2018-06-09 ENCOUNTER — Other Ambulatory Visit: Payer: Self-pay

## 2018-06-09 ENCOUNTER — Telehealth: Payer: Self-pay | Admitting: Internal Medicine

## 2018-06-09 ENCOUNTER — Ambulatory Visit (INDEPENDENT_AMBULATORY_CARE_PROVIDER_SITE_OTHER): Payer: BLUE CROSS/BLUE SHIELD | Admitting: Internal Medicine

## 2018-06-09 ENCOUNTER — Encounter: Payer: Self-pay | Admitting: Internal Medicine

## 2018-06-09 VITALS — BP 138/80 | HR 77 | Temp 98.5°F | Ht 68.0 in | Wt 197.0 lb

## 2018-06-09 DIAGNOSIS — E1169 Type 2 diabetes mellitus with other specified complication: Secondary | ICD-10-CM | POA: Diagnosis not present

## 2018-06-09 DIAGNOSIS — E1165 Type 2 diabetes mellitus with hyperglycemia: Secondary | ICD-10-CM

## 2018-06-09 DIAGNOSIS — Z794 Long term (current) use of insulin: Secondary | ICD-10-CM

## 2018-06-09 DIAGNOSIS — E785 Hyperlipidemia, unspecified: Secondary | ICD-10-CM

## 2018-06-09 DIAGNOSIS — I10 Essential (primary) hypertension: Secondary | ICD-10-CM

## 2018-06-09 MED ORDER — INSULIN PEN NEEDLE 32G X 4 MM MISC: 11 refills | Status: DC

## 2018-06-09 MED ORDER — TRESIBA FLEXTOUCH 100 UNIT/ML ~~LOC~~ SOPN: 30.0000 [IU] | PEN_INJECTOR | Freq: Every day | SUBCUTANEOUS | 3 refills | Status: DC

## 2018-06-09 MED ORDER — METFORMIN HCL ER 500 MG PO TB24: 1000.0000 mg | ORAL_TABLET | Freq: Two times a day (BID) | ORAL | 3 refills | Status: DC

## 2018-06-09 NOTE — Telephone Encounter (Signed)
Patient called PCP and they stated his enocrinologist will need to do the letter stating he is high risk and he shouldn't be working.  Please Advise, Thanks

## 2018-06-09 NOTE — Progress Notes (Signed)
Name: Troy Gordon  MRN/ DOB: [id number], 10-10-1965   Age/ Sex: 53 y.o., male    PCP: Margaree Mackintosh, MD   Reason for Endocrinology Evaluation: Type 2 Diabetes Mellitus     Date of Initial Endocrinology Visit: 06/09/2018     PATIENT IDENTIFIER: [name] is a 53 y.o. male with a past medical history of T2DM, HTN and Dyslipidemia. The patient presented for initial endocrinology clinic visit on 06/09/2018 for consultative assistance with his diabetes management.    HPI: [name] was    Diagnosed with DM in 2009 Prior Medications tried/Intolerance: Metformin, glipizide, januvia, no side effects. Has been on insulin since his diagnosis. Currently checking blood sugars 1 x / day,  Postprandial - has not checks it in a week  Hypoglycemia episodes : No Hemoglobin A1c has ranged from 7.3% in 2019, peaking at >14.0% in 2020. Patient required assistance for hypoglycemia:  Patient has required hospitalization within the last 1 year from hyper or hypoglycemia: no  In terms of diet, the patient drinks occasional sugar-sweetened beverages. Eats 3 meals a day, does not snack.     HOME DIABETES REGIMEN: Tresiba 38 units daily    Statin: yes ACE-I/ARB: yes Prior Diabetic Education:yes   METER DOWNLOAD SUMMARY: Did not bring    DIABETIC COMPLICATIONS: Microvascular complications:    Denies: retinopathy, CKD, neuropathy   Last eye exam: Completed 09/2017  Macrovascular complications:    Denies: CAD, PVD, CVA   PAST HISTORY: Past Medical History:  Past Medical History:  Diagnosis Date   Diabetes mellitus (HCC)    Dyslipidemia    Hypertension     Past Surgical History: History reviewed. No pertinent surgical history.   Social History:  reports that he has never smoked. He has never used smokeless tobacco. No history on file for alcohol and drug.  Family History: History reviewed. No pertinent family history.   HOME MEDICATIONS: Allergies as of  06/09/2018   No Known Allergies     Medication List       Accurate as of June 09, 2018  9:35 AM. Always use your most recent med list.        atorvastatin 80 MG tablet Commonly known as:  LIPITOR Take 1 tablet (80 mg total) by mouth daily at 6 PM.   fenofibrate 160 MG tablet Take 1 tablet (160 mg total) by mouth daily.   lisinopril-hydrochlorothiazide 20-25 MG tablet Commonly known as:  PRINZIDE,ZESTORETIC Take 1 tablet by mouth daily.   Evaristo Bury FlexTouch 100 UNIT/ML Sopn FlexTouch Pen Generic drug:  insulin degludec Inject 0.3 mLs (30 Units total) into the skin daily at 10 pm.        ALLERGIES: No Known Allergies   REVIEW OF SYSTEMS: A comprehensive ROS was conducted with the patient and is negative except as per HPI and below:  Review of Systems  Constitutional: Positive for weight loss. Negative for fever.  HENT: Negative for congestion and sore throat.   Eyes: Negative for blurred vision and photophobia.  Respiratory: Negative for cough and shortness of breath.   Cardiovascular: Negative for chest pain and palpitations.  Gastrointestinal: Negative for diarrhea and nausea.  Genitourinary: Negative for frequency.  Endo/Heme/Allergies: Negative for polydipsia.      OBJECTIVE:   VITAL SIGNS: BP 138/80    Pulse 77    Temp 98.5 F (36.9 C)    Ht 5\' 8"  (1.727 m)    Wt 197 lb (89.4 kg)    SpO2 97%  BMI 29.95 kg/m    PHYSICAL EXAM:  General: Pt appears well and is in NAD  Hydration: Well-hydrated with moist mucous membranes and good skin turgor  HEENT: Head: Unremarkable with good dentition. Oropharynx clear without exudate.  Eyes: External eye exam normal without stare, lid lag or exophthalmos.  EOM intact.  PERRL.  Neck: General: Supple without adenopathy or carotid bruits. Thyroid: Thyroid size normal.  No goiter or nodules appreciated. No thyroid bruit.  Lungs: Clear with good BS bilat with no rales, rhonchi, or wheezes  Heart: RRR with normal S1 and S2  and no gallops; no murmurs; no rub  Abdomen: Normoactive bowel sounds, soft, nontender, without masses or organomegaly palpable  Extremities:  Lower extremities - No pretibial edema. No lesions.  Skin: Normal texture and temperature to palpation. No rash noted. No Acanthosis nigricans/skin tags. No lipohypertrophy.  Neuro: MS is good with appropriate affect, pt is alert and Ox3    DM foot exam:  The skin of the feet is intact without sores or ulcerations. Plantar callous formation noted b/l. The pedal pulses are 1+ on right and 1+ on left. The sensation is intact to a screening 5.07, 10 gram monofilament bilaterally   DATA REVIEWED:  Lab Results  Component Value Date   HGBA1C >14.0 (H) 05/12/2018   HGBA1C 8.8 (H) 11/18/2017   HGBA1C 7.3 (H) 09/17/2017   Lab Results  Component Value Date   MICROALBUR 4.1 11/18/2017   LDLCALC 140 (H) 05/12/2018   CREATININE 1.59 (H) 05/16/2018   Lab Results  Component Value Date   MICRALBCREAT 18 11/18/2017    Lab Results  Component Value Date   CHOL 213 (H) 05/12/2018   HDL 38 (L) 05/12/2018   LDLCALC 140 (H) 05/12/2018   TRIG 208 (H) 05/12/2018   CHOLHDL 5.6 (H) 05/12/2018        ASSESSMENT / PLAN / RECOMMENDATIONS:   1) Type 2 Diabetes Mellitus, Poorly controlled, Without complications - Most recent A1c of > 14.0 %. Goal A1c < 7.0 %.   Plan: GENERAL:  Pt is in the high risk category for COVID-19 given his diagnosis of chronic medical conditions.  I have discussed with the patient the pathophysiology of diabetes. We went over the natural progression of the disease. We talked about both insulin resistance and insulin deficiency. We stressed the importance of lifestyle changes including diet and exercise. I explained the complications associated with diabetes including retinopathy, nephropathy, neuropathy as well as increased risk of cardiovascular disease. We went over the benefit seen with glycemic control.   I explained to the  patient that diabetic patients are at higher than normal risk for amputations. The patient was informed that diabetes is the number one cause of non-traumatic amputations in [place].   Advised pt to avoid sugar-sweetened beverages, he was offered a referral to our CDE if needed in the future.   He has been dong well with CHO diet.   Discussed the importance of glucose checks and the importance of the availability of this data to making informed decisions about diabetes management.     MEDICATIONS:  Decrease Tresiba to 30 units daily  Start Metformin 500 mg XR with titration to a goal of 2 tabs BID with meals  EDUCATION / INSTRUCTIONS:  BG monitoring instructions: Patient is instructed to check his blood sugars 2 times a day, fasting and bedtime.  Call [facility] if: BG persistently < 70 or > 300.  I reviewed the Rule of 15 for  the treatment of hypoglycemia in detail with the patient. Literature supplied.   2) Diabetic complications:   Eye: Does not have known diabetic retinopathy.   Neuro/ Feet: Does not have known diabetic peripheral neuropathy.  Renal: Patient does not have known baseline CKD. He is on an ACEI/ARB at present.   3) Lipids: Patient is on a statin. LDL above goal .Pt confirms compliance, if LDL continues to be > 100 mg/dL would consider adding zetia.    4) Hypertension: He is  at goal of < 140/90 mmHg.    F/u in 8 weeks    Signed electronically by: Lyndle Herrlich, MD  [facility]  [facility] [address] [place], [place] [address] Phone: [phone] FAX: [phone]   CC: Margaree Mackintosh, MD [address] [place] [address] Phone: [phone]  Fax: [phone]    Return to Endocrinology clinic as below: No future appointments.

## 2018-06-09 NOTE — Patient Instructions (Signed)
-   Start Metformin 1 pill with Breakfast  x 1 week, then increase to 1 pill twice a day (breakfast and supper) x 1 week, then increase to 2 pills with Breakfast  and 1 pill with supper, finally 2 pills twice a day with meals.   - Decrease Tresiba to 30 units daily  - Check sugar twice a day (fasting and bedtime )  - Please bring your meter on next visit     - HOW TO TREAT LOW BLOOD SUGARS (Blood sugar LESS THAN 70 MG/DL)  Please follow the RULE OF 15 for the treatment of hypoglycemia treatment (when your (blood sugars are less than 70 mg/dL)    STEP 1: Take 15 grams of carbohydrates when your blood sugar is low, which includes:   3-4 GLUCOSE TABS  OR  3-4 OZ OF JUICE OR REGULAR SODA OR  ONE TUBE OF GLUCOSE GEL     STEP 2: RECHECK blood sugar in 15 MINUTES STEP 3: If your blood sugar is still low at the 15 minute recheck --> then, go back to STEP 1 and treat AGAIN with another 15 grams of carbohydrates.

## 2018-06-10 NOTE — Telephone Encounter (Signed)
Please advise 

## 2018-06-10 NOTE — Telephone Encounter (Signed)
Pt made aware

## 2018-06-17 ENCOUNTER — Other Ambulatory Visit: Payer: Self-pay

## 2018-06-17 ENCOUNTER — Telehealth: Payer: Self-pay | Admitting: Internal Medicine

## 2018-06-17 ENCOUNTER — Ambulatory Visit (INDEPENDENT_AMBULATORY_CARE_PROVIDER_SITE_OTHER): Payer: BLUE CROSS/BLUE SHIELD | Admitting: Internal Medicine

## 2018-06-17 DIAGNOSIS — E1165 Type 2 diabetes mellitus with hyperglycemia: Secondary | ICD-10-CM

## 2018-06-17 DIAGNOSIS — I10 Essential (primary) hypertension: Secondary | ICD-10-CM | POA: Diagnosis not present

## 2018-06-17 DIAGNOSIS — E782 Mixed hyperlipidemia: Secondary | ICD-10-CM

## 2018-06-17 NOTE — Progress Notes (Signed)
   Subjective:    Patient ID: Troy Gordon, male    DOB: 10/19/1965, 53 y.o.   MRN: [id number]  HPI 53 year old Black Male recently referred to Endocrinologist for poorly controlled diabetes mellitus.  He has hypertension and hyperlipidemia.  A form came from his employer regarding request for short-term disability during the COVID-19 outbreak.  Patient said that Endocrinologist told him he was at high risk for coronavirus.  He has option to be out of work a few weeks and he would like to exercise that option.  Hemoglobin A1c early March was greater than 14 and had been 8.8% in September.  Dr. Lonzo Cloud for saw him March 30.  He was told to take 30 units of Tresiba flex touch daily at 10 PM.  He was started on Metformin XR 500 mg daily with goal of 2 tabs twice daily with meals.  Patient was to check blood sugar twice daily fasting and at bedtime.      Review of Systems see above     Objective:   Physical Exam Interactive audio and video telecommunications were attempted however failed due to patient having technical difficulties.  We continued with audio only.  He agreed to visit in this format.  He needs form completed to be out of work due to his risk of COVID-19 with history of diabetes, hypertension and hyperlipidemia.  Short-term disability form will be completed for him.  Identified as Troy Gordon holding the patient in this practice by 2 identifiers.     Assessment & Plan:  Poorly controlled diabetes mellitus  Hypertension  Hyperlipidemia  Plan: He will be following up with endocrinologist in the near future having had metformin added to his regimen.  He is to watch diet and get some exercise while he is at home.

## 2018-06-17 NOTE — Telephone Encounter (Signed)
Called Troy Gordon to let him know form is ready for pick up. He said he would be by this afternoon between 4-5 to pick up.

## 2018-07-06 ENCOUNTER — Encounter: Payer: Self-pay | Admitting: Internal Medicine

## 2018-07-06 NOTE — Patient Instructions (Signed)
Recently started on Metformin per endocrinologist and will be following up with endocrinologist.  Form will be completed for him to be out of work/short-term disability as requested.  Patient is to watch diet and try to get some exercise while he is out of work.  He is to check Accu-Cheks at least twice daily.

## 2018-07-07 ENCOUNTER — Telehealth: Payer: Self-pay | Admitting: Internal Medicine

## 2018-07-07 NOTE — Telephone Encounter (Signed)
Yes but I need more info on what is needed from company. It is best if company calls and speaks with Burna Mortimer- and how long does he want to be out.

## 2018-07-07 NOTE — Telephone Encounter (Signed)
Scheduled appt.

## 2018-07-07 NOTE — Telephone Encounter (Signed)
Troy Gordon called to say he had spoken with the company handling him being out of work due to COVID-19 and they need an update on his condition. I ask him to find out if they need letter or the form updated. He is going to check with them and let us know. Would you like to do virtual visit to update form or letter?

## 2018-07-08 ENCOUNTER — Encounter: Payer: Self-pay | Admitting: Internal Medicine

## 2018-07-08 ENCOUNTER — Ambulatory Visit (INDEPENDENT_AMBULATORY_CARE_PROVIDER_SITE_OTHER): Payer: BLUE CROSS/BLUE SHIELD | Admitting: Internal Medicine

## 2018-07-08 ENCOUNTER — Other Ambulatory Visit: Payer: Self-pay

## 2018-07-08 VITALS — BP 90/80 | Ht 68.0 in | Wt 197.0 lb

## 2018-07-08 DIAGNOSIS — E782 Mixed hyperlipidemia: Secondary | ICD-10-CM | POA: Diagnosis not present

## 2018-07-08 DIAGNOSIS — I10 Essential (primary) hypertension: Secondary | ICD-10-CM

## 2018-07-08 DIAGNOSIS — E1165 Type 2 diabetes mellitus with hyperglycemia: Secondary | ICD-10-CM

## 2018-07-08 DIAGNOSIS — K521 Toxic gastroenteritis and colitis: Secondary | ICD-10-CM

## 2018-07-08 NOTE — Progress Notes (Signed)
   Subjective:    Patient ID: Troy Gordon, male    DOB: 17-May-1965, 53 y.o.   MRN: [id number]  HPI 53 year old Black Male with history of diabetes mellitus works for [employer] has been out of work recently as his Actor indicated to him he was high risk for acquiring COVID-19 due to his diabetes.  In early March his hemoglobin A1c was greater than 14%.  He was sent to [facility].  He is now on Tresiba 30 units daily and metformin 500 mg XR 2 tabs twice daily with meals.  Feels that Accu-Cheks have improved with addition of metformin.  He has follow-up appoint with endocrinology in May.  Is not snacking.  Feels he is eating fairly healthy.  Could perhaps get a bit more exercise.  He would prefer not to return to work for a few more weeks given his risk factors.  We have filled out a form for him previously to be excused from work.  He now has more paperwork to be completed for apparent short-term disability benefits He says his work is been cooperative about his leave from work.  He has a follow-up appointment with endocrinologist in late May.  He has been having some issues with diarrhea from metformin but Accu-Cheks have been under very good control in the 144 range.  Interactive audio and video telecommunications were attempted between this provider and patient however failed due to patient having technical difficulties.  We continued and completed visit with audio only.  He could not seem to get his camera turned on.  Patient gives consent for visit in this format.  He is identified as Troy Gordon: A 53 year old black male by 2 identifiers who is a patient in this practice.      Review of Systems no new complaints other than diarrhea from metformin.  Advised patient to take metformin with meals     Objective:   Physical Exam  Appears to be in no acute distress.  Alert cooperative and able to give a clear concise history.      Assessment & Plan:   Poorly controlled diabetes now on Tresiba and metformin with follow-up scheduled with endocrinologist in May.  Diarrhea from metformin  Plan: Short-term disability paperwork will be completed for him.  He needs to get some input from Endocrinology regarding his return to work date.  Needs to make sure his diabetes is under adequate control and that he is getting adequate exercise.  Anticipate he would likely return to work in approximately 4 weeks.  He is comfortable with that assessment.

## 2018-07-08 NOTE — Patient Instructions (Signed)
He will continue to monitor his Accu-Cheks which have improved with the addition of metformin with [place].  He has follow-up with endocrinologist in May.  Short-term disability application will be completed for him.  However he was told we cannot guarantee him that this paperwork would be accepted for this reason.  He is comfortable being out of work an additional 4 weeks and says the company has been flexible with him regarding his work status.  Take metformin with meals.

## 2018-07-11 ENCOUNTER — Telehealth: Payer: Self-pay | Admitting: Internal Medicine

## 2018-07-11 DIAGNOSIS — E1165 Type 2 diabetes mellitus with hyperglycemia: Secondary | ICD-10-CM

## 2018-07-11 NOTE — Telephone Encounter (Signed)
Faxed FMLA Forms and 28 pages of Medical Records to [employer]

## 2018-07-14 ENCOUNTER — Telehealth: Payer: Self-pay | Admitting: Internal Medicine

## 2018-07-14 ENCOUNTER — Encounter: Payer: Self-pay | Admitting: Internal Medicine

## 2018-07-14 DIAGNOSIS — E1165 Type 2 diabetes mellitus with hyperglycemia: Secondary | ICD-10-CM

## 2018-07-14 DIAGNOSIS — I10 Essential (primary) hypertension: Secondary | ICD-10-CM

## 2018-07-14 DIAGNOSIS — E1169 Type 2 diabetes mellitus with other specified complication: Secondary | ICD-10-CM

## 2018-07-14 NOTE — Telephone Encounter (Signed)
Disability specialist Megan,  Calling to verify clinical information for Troy Gordon

## 2018-07-14 NOTE — Telephone Encounter (Signed)
Spoke to [place] and informed her that pt was instructed to follow up with PCP and HR to have disability paperwork completed.

## 2018-07-14 NOTE — Telephone Encounter (Signed)
Phone call from [facility] who has also called Endocrinology earlier today.  Coordinator is requesting more information on patient and the statement that he is high risk for COVID-19 with poor diabetic control.  He has follow-up appointment with Endocrinologist May 26.  Explained to her that it was documented in Dr. Ferman Hamming  note that he was at high risk for COVID-19.  Coordinator does not have access to that note.  She is asking about my documentation.  A lengthy form was completed last week for the patient to [facility]. Explained to her that this gentleman has poorly controlled diabetes with recent hemoglobin A1c greater than 14% and it would not be a good idea for him to enter a work situation with multiple coworkers with poorly controlled diabetes and a COVID-19 outbreak.  She agrees with this.  Patient is comfortable staying at home even if his disability benefits are not approved.  He does not feel like he should be working at this point in time.  Endocrinologist referred patient and coordinator to [place].  Therefore, the patient should not be at work at this time until his diabetes is under better control.  Patient has been told to get his diabetes under better control.

## 2018-07-22 ENCOUNTER — Telehealth: Payer: Self-pay | Admitting: Internal Medicine

## 2018-07-22 NOTE — Telephone Encounter (Signed)
LVM that letter was ready for pick up

## 2018-07-22 NOTE — Telephone Encounter (Signed)
Troy Gordon [phone]  [place] called to see if he could get letter to go back to work, his claim for disability was denied.

## 2018-07-22 NOTE — Telephone Encounter (Signed)
OK to prepare letter.

## 2018-07-22 NOTE — Telephone Encounter (Signed)
Pt CB will pick up this afternoon.

## 2018-07-24 NOTE — Telephone Encounter (Signed)
Sandler came by and pick up note on 07/22/18,, we received more paperwork from [place] for him to continue to be out on FMLA. I called and spoke with Kazumi and he let me know that he had returned to work on 07/23/18 and to not fill out more paperwork. So I faxed to [employer] the return to work note.

## 2018-07-28 ENCOUNTER — Telehealth: Payer: Self-pay | Admitting: Internal Medicine

## 2018-07-28 ENCOUNTER — Encounter: Payer: Self-pay | Admitting: Internal Medicine

## 2018-07-28 NOTE — Telephone Encounter (Signed)
Troy Gordon [phone]  Troy Gordon called to say that Troy Gordon is going to call and they need verification of dates he was out of work. First day out was 06/10/18 and last day out was 07/22/18. So the first day he went back to work was 07/23/18

## 2018-07-28 NOTE — Telephone Encounter (Signed)
Faxed updated work note to [place] with dates out of work.

## 2018-08-05 ENCOUNTER — Ambulatory Visit: Payer: BLUE CROSS/BLUE SHIELD | Admitting: Internal Medicine

## 2018-08-05 NOTE — Progress Notes (Deleted)
Name: Troy Deutscherimothy Bouchie  Age/ Sex: 53 y.o., male   MRN/ DOB: [id number], 03/26/1965     PCP: Margaree MackintoshBaxley, Mary J, MD   Reason for Endocrinology Evaluation: Type 2 Diabetes Mellitus  Initial Endocrine Consultative Visit: 06/09/2018    PATIENT IDENTIFIER: Troy Gordon is a 53 y.o. male with a past medical history of T2DM, HTN and Dyslipidemia. The patient has followed with Endocrinology clinic since 06/09/2018 for consultative assistance with management of his diabetes.  DIABETIC HISTORY:  Troy Gordon was diagnosed with T2DM in 2009. On his initial visit to our clinic his A1c was > 14.0%. He was only on tresiba. H ehas been on Metformin, Glipizide, Januvia in the past with no reported side effects. His hemoglobin A1c has ranged from 7.3% in 2019, peaking at >14.0% in 2020.  SUBJECTIVE:   During the last visit (06/09/2018): A1c > 14.0 %. Started Metformin and reduced tresiba dose.   Today (08/05/2018): Troy Gordon  He checks his blood sugars *** times daily, preprandial to breakfast and ***. The patient has *** had hypoglycemic episodes since the last clinic visit, which typically occur *** x / - most often occuring ***. The patient is *** symptomatic with these episodes, with symptoms of {symptoms; hypoglycemia:9084048}. Otherwise, the patient {HAS/HAS NOT:522402} required any recent emergency interventions for hypoglycemia and {HAS/HAS NOT:522402} had recent hospitalizations secondary to hyper or hypoglycemic episodes.    ROS: As per HPI and as detailed below: ROS    HOME DIABETES REGIMEN:       METER DOWNLOAD SUMMARY: Date range evaluated: *** Fingerstick Blood Glucose Tests = *** Average Number Tests/Day = *** Overall Mean FS Glucose = *** Standard Deviation = ***  BG Ranges: Low = *** High = ***   Hypoglycemic Events/30 Days: BG < 50 = *** Episodes of symptomatic severe hypoglycemia = ***     DIABETIC COMPLICATIONS: Microvascular complications:    Denies: retinopathy, CKD, neuropathy  Last Eye Exam: Completed   Macrovascular complications:    Denies: CAD, CVA, PVD   HISTORY:  Past Medical History:  Past Medical History:  Diagnosis Date  . Diabetes mellitus (HCC)   . Dyslipidemia   . Hypertension      Past Surgical History: No past surgical history on file.  Social History:  reports that he has never smoked. He has never used smokeless tobacco. No history on file for alcohol and drug. Family History:  Family History  Problem Relation Age of Onset  . Diabetes Maternal Grandmother       HOME MEDICATIONS: Allergies as of 08/05/2018   No Known Allergies     Medication List       Accurate as of Aug 05, 2018  8:21 AM. If you have any questions, ask your nurse or doctor.        atorvastatin 80 MG tablet Commonly known as:  LIPITOR Take 1 tablet (80 mg total) by mouth daily at 6 PM.   fenofibrate 160 MG tablet Take 1 tablet (160 mg total) by mouth daily.   Insulin Pen Needle 32G X 4 MM Misc Commonly known as:  BD Pen Needle Nano 2nd Gen Once daily   lisinopril-hydrochlorothiazide 20-25 MG tablet Commonly known as:  ZESTORETIC Take 1 tablet by mouth daily.   metFORMIN 500 MG 24 hr tablet Commonly known as:  Glucophage XR Take 2 tablets (1,000 mg total) by mouth 2 (two) times daily with a meal.   Evaristo Buryresiba FlexTouch 100 UNIT/ML Sopn FlexTouch Pen Generic drug:  insulin  degludec Inject 0.3 mLs (30 Units total) into the skin daily at 10 pm.        OBJECTIVE:   Vital Signs: There were no vitals taken for this visit.  Wt Readings from Last 3 Encounters:  07/08/18 197 lb (89.4 kg)  06/09/18 197 lb (89.4 kg)  05/20/18 193 lb (87.5 kg)     Exam: General: Pt appears well and is in NAD  Hydration: Well-hydrated with moist mucous membranes and good skin turgor  HEENT: Head: Unremarkable with good dentition. Oropharynx clear without exudate.   Eyes: External eye exam normal without stare, lid lag or exophthalmos.  EOM intact.  PERRL.  Neck: General: Supple without adenopathy. Thyroid: Thyroid size normal.  No goiter or nodules appreciated. No thyroid bruit.  Lungs: Clear with good BS bilat with no rales, rhonchi, or wheezes  Heart: RRR with normal S1 and S2 and no gallops; no murmurs; no rub  Abdomen: Normoactive bowel sounds, soft, nontender, without masses or organomegaly palpable  Extremities: No pretibial edema. No tremor. Normal strength and motion throughout. See detailed diabetic foot exam below.  Skin: Normal texture and temperature to palpation. No rash noted.   Neuro: MS is good with appropriate affect, pt is alert and Ox3       DM foot exam: 06/09/2018 The skin of the feet is intact without sores or ulcerations. Plantar callous formation noted b/l. The pedal pulses are 1+ on right and 1+ on left. The sensation is intact to a screening 5.07, 10 gram monofilament bilaterally          DATA REVIEWED:  Lab Results  Component Value Date   HGBA1C >14.0 (H) 05/12/2018   HGBA1C 8.8 (H) 11/18/2017   HGBA1C 7.3 (H) 09/17/2017   Lab Results  Component Value Date   MICROALBUR 4.1 11/18/2017   LDLCALC 140 (H) 05/12/2018   CREATININE 1.59 (H) 05/16/2018   Lab Results  Component Value Date   MICRALBCREAT 18 11/18/2017     Lab Results  Component Value Date   CHOL 213 (H) 05/12/2018   HDL 38 (L) 05/12/2018   LDLCALC 140 (H) 05/12/2018   TRIG 208 (H) 05/12/2018   CHOLHDL 5.6 (H) 05/12/2018         ASSESSMENT / PLAN / RECOMMENDATIONS:   1) Type {NUMBERS 1 OR 2:522190} Diabetes Mellitus, ***controlled, With *** complications - Most recent A1c of *** %. Goal A1c < *** %.  ***  Plan: MEDICATIONS:  ***  EDUCATION / INSTRUCTIONS:  BG monitoring instructions: Patient is instructed to check his blood sugars *** times a day, ***.  Call [facility] if: BG persistently < 70 or > 300. . I  reviewed the Rule of 15 for the treatment of hypoglycemia in detail with the patient. Literature supplied.  REFERRALS:  ***.   2) Diabetic complications:   Eye: Does *** have known diabetic retinopathy.   Neuro/ Feet: Does *** have known diabetic peripheral neuropathy .   Renal: Patient does *** have known baseline CKD. He   is *** on an ACEI/ARB at present. Check urine albumin/creatinine ratio yearly starting at time of diagnosis. If albuminuria is positive, treatment is geared toward better glucose, blood pressure control and use of ACE inhibitors or ARBs. Monitor electrolytes and creatinine once to twice yearly.   3) Lipids: Patient is *** on a statin.  4) Hypertension: *** at goal of < 140/90 mmHg.    F/U in ***    Signed electronically by: Lyndle Herrlich,  MD  [facility]  [facility] [address] [place], [place] [address] Phone: [phone] FAX: [phone]   CC: Margaree Mackintosh, MD [address] [place] [address] Phone: [phone]  Fax: [phone]  Return to Endocrinology clinic as below: Future Appointments  Date Time Provider Department Center  08/05/2018  9:30 AM Aman Bonet, Konrad Dolores, MD [facility] None

## 2018-10-30 ENCOUNTER — Other Ambulatory Visit: Payer: Self-pay | Admitting: Internal Medicine

## 2018-12-10 ENCOUNTER — Other Ambulatory Visit: Payer: Self-pay | Admitting: Internal Medicine

## 2018-12-10 NOTE — Telephone Encounter (Signed)
Refill x 90 days. Has appt in December

## 2018-12-15 DIAGNOSIS — H6122 Impacted cerumen, left ear: Secondary | ICD-10-CM | POA: Diagnosis not present

## 2018-12-21 ENCOUNTER — Emergency Department (HOSPITAL_COMMUNITY)
Admission: EM | Admit: 2018-12-21 | Discharge: 2018-12-21 | Disposition: A | Discharge status: Home / Self Care | Payer: BLUE CROSS/BLUE SHIELD | Attending: Emergency Medicine | Admitting: Emergency Medicine

## 2018-12-21 ENCOUNTER — Other Ambulatory Visit: Payer: Self-pay

## 2018-12-21 ENCOUNTER — Encounter (HOSPITAL_COMMUNITY): Payer: Self-pay

## 2018-12-21 DIAGNOSIS — X58XXXA Exposure to other specified factors, initial encounter: Secondary | ICD-10-CM | POA: Diagnosis not present

## 2018-12-21 DIAGNOSIS — Y929 Unspecified place or not applicable: Secondary | ICD-10-CM | POA: Insufficient documentation

## 2018-12-21 DIAGNOSIS — T162XXA Foreign body in left ear, initial encounter: Secondary | ICD-10-CM

## 2018-12-21 DIAGNOSIS — Y999 Unspecified external cause status: Secondary | ICD-10-CM | POA: Diagnosis not present

## 2018-12-21 DIAGNOSIS — Y939 Activity, unspecified: Secondary | ICD-10-CM | POA: Insufficient documentation

## 2018-12-21 DIAGNOSIS — H9202 Otalgia, left ear: Secondary | ICD-10-CM | POA: Diagnosis not present

## 2018-12-21 MED ORDER — OFLOXACIN 0.3 % OT SOLN: 10.0000 [drp] | Freq: Two times a day (BID) | OTIC | 0 refills | Status: DC

## 2018-12-21 NOTE — Discharge Instructions (Addendum)
Use the drops in your left ear as directed.  Follow-up with your primary doctor for recheck.

## 2018-12-21 NOTE — ED Provider Notes (Signed)
[facility] EMERGENCY DEPARTMENT Provider Note   CSN: [id number] Arrival date & time: 12/21/18  8469     History   Chief Complaint Chief Complaint  Patient presents with  . Otalgia    HPI Troy Gordon is a 53 y.o. male.     HPI   Troy Gordon is a 53 y.o. male who presents to the Emergency Department complaining of pain and foreign body sensation of the left ear.  He states that a portion of a Q-tip came off in his ear 1 week ago.  He was seen at a urgent care facility 3 days ago and states that they removed a portion of the Q-tip, but was unable to remove the remaining part.  He comes emergency department today with complaints of fullness of his left ear and pain.  He denies headache, fever, chills, dizziness, neck pain or drainage from his ear.  Nothing makes his symptoms better or worse.   Past Medical History:  Diagnosis Date  . Diabetes mellitus ([place])   . Dyslipidemia   . Hypertension     Patient Active Problem List   Diagnosis Date Noted  . Hyperlipidemia associated with type 2 diabetes mellitus ([place]) 06/07/2017  . Benign essential HTN 11/20/2016  . Type 2 diabetes mellitus with hyperglycemia, with long-term current use of insulin ([place]) 11/20/2016    History reviewed. No pertinent surgical history.      Home Medications    Prior to Admission medications   Medication Sig Start Date End Date Taking? Authorizing Provider  atorvastatin (LIPITOR) 80 MG tablet TAKE 1 TABLET DAILY 12/10/18   Elby Showers, MD  fenofibrate 160 MG tablet TAKE 1 TABLET DAILY 10/30/18   Elby Showers, MD  Insulin Pen Needle (BD PEN NEEDLE NANO 2ND GEN) 32G X 4 MM MISC Once daily 06/09/18   Shamleffer, Melanie Crazier, MD  lisinopril-hydrochlorothiazide (ZESTORETIC) 20-25 MG tablet TAKE 1 TABLET DAILY 12/10/18   Elby Showers, MD  metFORMIN (GLUCOPHAGE XR) 500 MG 24 hr tablet Take 2 tablets (1,000 mg total) by mouth 2 (two) times daily with a meal. 06/09/18   Shamleffer, Melanie Crazier, MD  ofloxacin (FLOXIN) 0.3 % OTIC solution Place 10 drops into the left ear 2 (two) times daily. 12/21/18   Tykera Skates, PA-C  TRESIBA FLEXTOUCH 100 UNIT/ML SOPN FlexTouch Pen Inject 0.3 mLs (30 Units total) into the skin daily at 10 pm. 06/09/18   Shamleffer, Melanie Crazier, MD    Family History Family History  Problem Relation Age of Onset  . Diabetes Maternal Grandmother     Social History Social History   Tobacco Use  . Smoking status: Never Smoker  . Smokeless tobacco: Never Used  Substance Use Topics  . Alcohol use: Never    Frequency: Never  . Drug use: Never     Allergies   Patient has no known allergies.   Review of Systems Review of Systems  Constitutional: Negative for chills, fatigue and fever.  HENT: Positive for ear pain. Negative for congestion, ear discharge, facial swelling, hearing loss, sore throat and trouble swallowing.   Eyes: Negative for visual disturbance.  Respiratory: Negative for cough and shortness of breath.   Cardiovascular: Negative for chest pain.  Musculoskeletal: Negative for arthralgias, neck pain and neck stiffness.  Skin: Negative for rash.  Neurological: Negative for dizziness, facial asymmetry, weakness, numbness and headaches.     Physical Exam Updated Vital Signs BP (!) 137/103 (BP Location: Right Arm)   Pulse 66  Temp 98.5 F (36.9 C) (Oral)   Resp 18   Ht 5\' 8"  (1.727 m)   Wt 87.5 kg   SpO2 100%   BMI 29.35 kg/m   Physical Exam Vitals signs and nursing note reviewed.  Constitutional:      Appearance: Normal appearance. He is not ill-appearing or toxic-appearing.  HENT:     Head:     Comments: Foreign body to the left ear canal.  Unable to visualize TM.  No edema or drainage of the canal. No mastoid tenderness.      Right Ear: Hearing and external ear normal.     Left Ear: Hearing and external ear normal. A foreign body is present.     Mouth/Throat:     Mouth: Mucous membranes are moist.  Neck:      Musculoskeletal: Normal range of motion.  Cardiovascular:     Rate and Rhythm: Normal rate and regular rhythm.     Pulses: Normal pulses.  Pulmonary:     Effort: Pulmonary effort is normal. No respiratory distress.  Lymphadenopathy:     Cervical: No cervical adenopathy.  Neurological:     General: No focal deficit present.     Mental Status: He is alert.     Sensory: Sensation is intact. No sensory deficit.     Motor: No weakness.     Coordination: Coordination is intact.     Comments: CN II-XII grossly intact      ED Treatments / Results  Labs (all labs ordered are listed, but only abnormal results are displayed) Labs Reviewed - No data to display  EKG None  Radiology No results found.  Procedures Procedures (including critical care time)  Medications Ordered in ED Medications - No data to display   Initial Impression / Assessment and Plan / ED Course  I have reviewed the triage vital signs and the nursing notes.  Pertinent labs & imaging results that were available during my care of the patient were reviewed by me and considered in my medical decision making (see chart for details).        Patient with foreign body to the left ear canal that has been present for 1 week.  No neurological deficits on exam.  Left ear canal was irrigated by nursing staff using saline.  Foreign body was successfully removed without complication on repeat exam of the left ear, TM now visualized and appears intact.  TM appears erythematous.  Will start patient on ofloxacin otic drops.  He agrees to arrange follow-up with his PCP.  Return precautions were discussed.  Final Clinical Impressions(s) / ED Diagnoses   Final diagnoses:  Foreign body of left ear, initial encounter    ED Discharge Orders         Ordered    ofloxacin (FLOXIN) 0.3 % OTIC solution  2 times daily     12/21/18 0754           02/20/19, PA-C 12/21/18 02/20/19    6384, MD 12/25/18 1531

## 2018-12-21 NOTE — ED Triage Notes (Signed)
Pt reports had a q tip break off in ear last week.  Reports went to an Urgent Care Thursday and had a piece of it removed but says he was told something was still in ear.  Pt says he still feels something in ear.

## 2018-12-29 ENCOUNTER — Telehealth: Payer: Self-pay | Admitting: Internal Medicine

## 2018-12-29 NOTE — Telephone Encounter (Signed)
Scheduled appointment, still has puss coming from ear

## 2018-12-29 NOTE — Telephone Encounter (Signed)
Troy Gordon [phone]  [name] called to say he went to the ED  At [facility] back on 12/21/18 for ear pain and felt like something was in his ear. There was a piece of a Qtip in there and it was infected, they gave him antibiotic solution and said to follow up with PCP.

## 2018-12-29 NOTE — Telephone Encounter (Signed)
Does he want me to recheck it? If asymptomatic may be OK now since foreign body was removed. I saw note from ED

## 2018-12-30 ENCOUNTER — Other Ambulatory Visit: Payer: Self-pay

## 2018-12-30 ENCOUNTER — Encounter: Payer: Self-pay | Admitting: Internal Medicine

## 2018-12-30 ENCOUNTER — Ambulatory Visit (INDEPENDENT_AMBULATORY_CARE_PROVIDER_SITE_OTHER): Payer: BC Managed Care – PPO | Admitting: Internal Medicine

## 2018-12-30 VITALS — BP 140/80 | HR 93 | Temp 97.1°F | Ht 68.0 in | Wt 195.0 lb

## 2018-12-30 DIAGNOSIS — E1169 Type 2 diabetes mellitus with other specified complication: Secondary | ICD-10-CM

## 2018-12-30 DIAGNOSIS — E785 Hyperlipidemia, unspecified: Secondary | ICD-10-CM

## 2018-12-30 DIAGNOSIS — H60502 Unspecified acute noninfective otitis externa, left ear: Secondary | ICD-10-CM | POA: Diagnosis not present

## 2018-12-30 MED ORDER — DOXYCYCLINE HYCLATE 100 MG PO TABS: 100.0000 mg | ORAL_TABLET | Freq: Two times a day (BID) | ORAL | 0 refills | Status: DC

## 2018-12-30 NOTE — Patient Instructions (Addendum)
Doxycycline 100 mg twice daily for 10 days.  Generic Cortisporin otic suspension 4 drops in left external ear canal 4 times a day.  Follow-up later this week for recheck.  May need to see ENT physician.  Ask employer to let him use earmuffs instead of earplugs and left external ear canal.  Note given to be out of work if he cannot find such a device for ear protection.

## 2018-12-30 NOTE — Progress Notes (Signed)
   Subjective:    Patient ID: Troy Gordon, male    DOB: 1965/11/24, 53 y.o.   MRN: [id number]  HPI He has recently moved to [place]. He was seen in ED at [facility]  October 11. Apparently was using an ear wax removal tool that broke off in his ear about a week prior to ED visit. He has to wear earplugs at work due to noise concerns.  In ED he was afebrile. TM was not visualized. Foreign body seen in ear. Left ear was irrigated by Nursing staff and note says foreign body removed. TM reported as erythematous but intact. Patient was prescribed ofloxacin drops but pharmacy told him these were not on his plan and he was able to another preparation cheaper but does not know name of that otic solution.  Review of Systems still has left ear pain. No drainage. Hx of DM     Objective:   Physical Exam Blood pressure 140/80, pulse 93, he is afebrile, weight 195 pounds.  He is in no acute distress.  Using a curette, debris was cleaned out of his left external ear canal including some dark material which he thinks is grease from his work.  Sometimes he sticks his finger in his ear which he says finger may have some grease on it.  There is no apparent foreign body in the external ear canal.  The TM is not well seen but canal is erythematous.       Assessment & Plan:  Otitis externa and perhaps otitis media left ear  Plan: Doxycycline 100 mg twice daily for 10 days.  He says he may not be able to work on Wednesday can wear a left ear plug which I do not think is a good idea at this time due to his inflamed external ear canal.  I have given him Cortisporin otic suspension generic drops to use 4 drops in left external ear canal 4 times a day.  He will follow-up later this week.  He may need to see ENT physician.  He was given a note to excuse him from work if he cannot find some earlmuff type of protective hearing device to wear instead of using earplugs.

## 2019-01-02 ENCOUNTER — Other Ambulatory Visit: Payer: Self-pay

## 2019-01-02 ENCOUNTER — Ambulatory Visit (INDEPENDENT_AMBULATORY_CARE_PROVIDER_SITE_OTHER): Payer: BC Managed Care – PPO | Admitting: Internal Medicine

## 2019-01-02 ENCOUNTER — Encounter: Payer: Self-pay | Admitting: Internal Medicine

## 2019-01-02 VITALS — BP 120/80 | HR 80 | Temp 98.3°F | Ht 68.0 in | Wt 195.0 lb

## 2019-01-02 DIAGNOSIS — H60312 Diffuse otitis externa, left ear: Secondary | ICD-10-CM

## 2019-01-02 NOTE — Patient Instructions (Signed)
Continue doxycycline as previously prescribed twice daily orally.  Continue Cortisporin otic drops 4 times a day.  Follow-up in 1 week.

## 2019-01-02 NOTE — Progress Notes (Signed)
   Subjective:    Patient ID: Troy Gordon, male    DOB: 07-02-1965, 53 y.o.   MRN: [id number]  HPI In today to follow-up on left otitis externa.  Has been taking doxycycline orally and using Cortisporin otic drops 4 times a day.  He was able to purchase some earmuffs for work to protect his hearing in noisy environment rather than use earplugs as he was previously doing.  Ear is slowly getting better.  He says he can hear.    Review of Systems no fever and no new symptoms.     Objective:   Physical Exam  There is debris in the left external ear canal which was removed today.  The drum is slightly red and scarred but appears to be intact      Assessment & Plan:  Severe left otitis externa-mildly improving  Plan: Continue ear drops and finish course of antibiotics as previously prescribed.  Follow-up in 1 week.  Patient not charged for visit

## 2019-01-09 ENCOUNTER — Encounter: Payer: Self-pay | Admitting: Internal Medicine

## 2019-01-09 ENCOUNTER — Other Ambulatory Visit: Payer: Self-pay

## 2019-01-09 ENCOUNTER — Ambulatory Visit (INDEPENDENT_AMBULATORY_CARE_PROVIDER_SITE_OTHER): Payer: BC Managed Care – PPO | Admitting: Internal Medicine

## 2019-01-09 VITALS — BP 102/60 | HR 81 | Ht 68.0 in | Wt 192.0 lb

## 2019-01-09 DIAGNOSIS — H60312 Diffuse otitis externa, left ear: Secondary | ICD-10-CM

## 2019-01-09 NOTE — Progress Notes (Addendum)
   Subjective:    Patient ID: Troy Gordon, male    DOB: 1965-04-12, 53 y.o.   MRN: [id number]  HPI For brief recheck left otitis externa.  He has just run out of ear drops and has 1 more capsule of doxycycline to take.  He is now wearing approved earmuffs instead of earplugs for noise protection at work.  Ear is feeling better.    Review of Systems reports no drainage or pain from left ear.     Objective:   Physical Exam  Left TM appears to be chronically scarred but external canal is clean and free of debris.  Hearing is normal in left ear at 20 dB.    Assessment & Plan:  Resolving left left otitis externa.  Finish 1 remaining capsule with doxycycline.  He has physical exam appointment here in December.  No charge for visit today.

## 2019-01-09 NOTE — Patient Instructions (Addendum)
Hearing is normal at 20 dB.  Finish remaining capsule of doxycycline.  Keep CPE appointment for December.

## 2019-02-16 ENCOUNTER — Other Ambulatory Visit: Payer: BC Managed Care – PPO | Admitting: Internal Medicine

## 2019-02-16 ENCOUNTER — Other Ambulatory Visit: Payer: Self-pay

## 2019-02-16 DIAGNOSIS — E119 Type 2 diabetes mellitus without complications: Secondary | ICD-10-CM

## 2019-02-16 DIAGNOSIS — Z0000 Encounter for general adult medical examination without abnormal findings: Secondary | ICD-10-CM

## 2019-02-16 DIAGNOSIS — E782 Mixed hyperlipidemia: Secondary | ICD-10-CM

## 2019-02-16 DIAGNOSIS — Z125 Encounter for screening for malignant neoplasm of prostate: Secondary | ICD-10-CM

## 2019-02-16 DIAGNOSIS — E781 Pure hyperglyceridemia: Secondary | ICD-10-CM | POA: Diagnosis not present

## 2019-02-16 DIAGNOSIS — E785 Hyperlipidemia, unspecified: Secondary | ICD-10-CM | POA: Diagnosis not present

## 2019-02-16 DIAGNOSIS — E1169 Type 2 diabetes mellitus with other specified complication: Secondary | ICD-10-CM | POA: Diagnosis not present

## 2019-02-17 LAB — COMPLETE METABOLIC PANEL WITH GFR
AG Ratio: 1.5 (calc) (ref 1.0–2.5)
ALT: 21 U/L (ref 9–46)
AST: 21 U/L (ref 10–35)
Albumin: 4.3 g/dL (ref 3.6–5.1)
Alkaline phosphatase (APISO): 46 U/L (ref 35–144)
BUN/Creatinine Ratio: 17 (calc) (ref 6–22)
BUN: 24 mg/dL (ref 7–25)
CO2: 32 mmol/L (ref 20–32)
Calcium: 9.8 mg/dL (ref 8.6–10.3)
Chloride: 105 mmol/L (ref 98–110)
Creat: 1.45 mg/dL — ABNORMAL HIGH (ref 0.70–1.33)
GFR, Est African American: 63 mL/min/{1.73_m2} (ref >=60)
GFR, Est Non African American: 54 mL/min/{1.73_m2} — ABNORMAL LOW (ref >=60)
Globulin: 2.9 g/dL (calc) (ref 1.9–3.7)
Glucose, Bld: 119 mg/dL — ABNORMAL HIGH (ref 65–99)
Potassium: 4 mmol/L (ref 3.5–5.3)
Sodium: 143 mmol/L (ref 135–146)
Total Bilirubin: 0.6 mg/dL (ref 0.2–1.2)
Total Protein: 7.2 g/dL (ref 6.1–8.1)

## 2019-02-17 LAB — CBC WITH DIFFERENTIAL/PLATELET
Absolute Monocytes: 396 cells/uL (ref 200–950)
Basophils Absolute: 30 cells/uL (ref 0–200)
Basophils Relative: 0.5 %
Eosinophils Absolute: 102 cells/uL (ref 15–500)
Eosinophils Relative: 1.7 %
HCT: 47.2 % (ref 38.5–50.0)
Hemoglobin: 15.4 g/dL (ref 13.2–17.1)
Lymphs Abs: 2580 cells/uL (ref 850–3900)
MCH: 27.2 pg (ref 27.0–33.0)
MCHC: 32.6 g/dL (ref 32.0–36.0)
MCV: 83.2 fL (ref 80.0–100.0)
MPV: 12.8 fL — ABNORMAL HIGH (ref 7.5–12.5)
Monocytes Relative: 6.6 %
Neutro Abs: 2892 cells/uL (ref 1500–7800)
Neutrophils Relative %: 48.2 %
Platelets: 169 10*3/uL (ref 140–400)
RBC: 5.67 10*6/uL (ref 4.20–5.80)
RDW: 13.5 % (ref 11.0–15.0)
Total Lymphocyte: 43 %
WBC: 6 10*3/uL (ref 3.8–10.8)

## 2019-02-17 LAB — LIPID PANEL
Cholesterol: 161 mg/dL (ref <200)
HDL: 47 mg/dL (ref >=40)
LDL Cholesterol (Calc): 95 mg/dL (calc)
Non-HDL Cholesterol (Calc): 114 mg/dL (calc) (ref <130)
Total CHOL/HDL Ratio: 3.4 (calc) (ref <5.0)
Triglycerides: 98 mg/dL (ref <150)

## 2019-02-17 LAB — HEMOGLOBIN A1C
Hgb A1c MFr Bld: 9.4 % of total Hgb — ABNORMAL HIGH (ref <5.7)
Mean Plasma Glucose: 223 (calc)
eAG (mmol/L): 12.4 (calc)

## 2019-02-17 LAB — PSA: PSA: 0.4 ng/mL (ref <=4.0)

## 2019-02-19 ENCOUNTER — Ambulatory Visit (INDEPENDENT_AMBULATORY_CARE_PROVIDER_SITE_OTHER): Payer: BC Managed Care – PPO | Admitting: Internal Medicine

## 2019-02-19 ENCOUNTER — Encounter: Payer: Self-pay | Admitting: Internal Medicine

## 2019-02-19 ENCOUNTER — Other Ambulatory Visit: Payer: Self-pay

## 2019-02-19 VITALS — BP 110/80 | HR 78 | Temp 98.0°F | Ht 68.0 in | Wt 190.0 lb

## 2019-02-19 DIAGNOSIS — E1169 Type 2 diabetes mellitus with other specified complication: Secondary | ICD-10-CM

## 2019-02-19 DIAGNOSIS — N1831 Chronic kidney disease, stage 3a: Secondary | ICD-10-CM

## 2019-02-19 DIAGNOSIS — I10 Essential (primary) hypertension: Secondary | ICD-10-CM | POA: Diagnosis not present

## 2019-02-19 DIAGNOSIS — E785 Hyperlipidemia, unspecified: Secondary | ICD-10-CM

## 2019-02-19 DIAGNOSIS — Z125 Encounter for screening for malignant neoplasm of prostate: Secondary | ICD-10-CM

## 2019-02-19 DIAGNOSIS — Z0000 Encounter for general adult medical examination without abnormal findings: Secondary | ICD-10-CM | POA: Diagnosis not present

## 2019-02-19 DIAGNOSIS — E782 Mixed hyperlipidemia: Secondary | ICD-10-CM | POA: Diagnosis not present

## 2019-02-19 LAB — POCT URINALYSIS DIPSTICK
Appearance: NEGATIVE
Bilirubin, UA: NEGATIVE
Blood, UA: NEGATIVE
Glucose, UA: NEGATIVE
Ketones, UA: NEGATIVE
Leukocytes, UA: NEGATIVE
Nitrite, UA: NEGATIVE
Odor: NEGATIVE
Protein, UA: NEGATIVE
Spec Grav, UA: 1.01 (ref 1.010–1.025)
Urobilinogen, UA: 0.2 E.U./dL
pH, UA: 6 (ref 5.0–8.0)

## 2019-02-19 MED ORDER — SITAGLIPTIN PHOSPHATE 50 MG PO TABS: 50.0000 mg | ORAL_TABLET | Freq: Every day | ORAL | 0 refills | Status: DC

## 2019-02-19 MED ORDER — SILDENAFIL CITRATE 100 MG PO TABS: ORAL_TABLET | ORAL | 3 refills | Status: DC

## 2019-02-19 NOTE — Patient Instructions (Addendum)
Add Januvia to [place]. Refill Viagra. Follow up early February. Flu vaccine declined.

## 2019-02-19 NOTE — Progress Notes (Signed)
   Subjective:    Patient ID: Troy Gordon, male    DOB: [birth year], 53 y.o.   MRN: [id number]  HPI Pleasant 53 year old Male presents for health maintenance exam and evaluation of medical issues.  He first presented to the office in August 2018 having moved here from [place].  History of hyperlipidemia, hypertension and type 2 diabetes mellitus.  History of mixed hypertriglyceridemia.  History of elevated serum creatinine which is being followed.  Social history: He is divorced.  Has a high school education.  Resides alone.  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  Family works 12-hour days 5 days a week.  Tries to get exercise with weight training and other light exercise.  Does not smoke or consume alcohol or use illicit drugs.  Has an adult son in his 53s.  Family history: He was raised from someone other than his parents and does not know much about his parents family history says that they are both living with history of hypertension.  He does not think any of one of them has diabetes.  Maternal grandmother did have diabetes.  1 brother apparently in good health and 6 sisters.  One sister might have cancer but he does not know what type.  She also has arthritis.  Another sister is overweight.  Not much else is that his sisters.  His son is in good health.  No known drug allergies  No hospitalizations or operations  Was diagnosed with diabetes around 2009.  Review of his labs shows fasting glucose of 119, lipid panel normal, PSA normal, dipstick UA normal.  His creatinine is 1.45 and improved from last check at 1.59 months ago.  Takes aspirin for hypertension.  We will continue to monitor creatinine every 6 months.  Currently taking 36 units Tresiba daily  His hemoglobin A1c unfortunately is 9.4% but was greater than 14% in March.  He does not want to go back to endocrinologist at this point in time.  I have researched this and  have decided to add Januvia to [place].  He requested Viagra be refilled.  He will follow-up in early February.  He declined flu vaccine.  Review of Systems     Objective:   Physical Exam Blood pressure 110/80 pulse 78 temperature 98 degrees weight 190 pounds height 5 feet 8 inches BMI 28.89 skin warm and dry.  Nodes none.  TMs and pharynx are clear.  Neck is supple without JVD thyromegaly or carotid bruits.  Abdomen is benign without hepatosplenomegaly masses or tenderness.  Chest is clear to auscultation without rales or wheezing.  No edema of the lower extremities and diabetic foot exam shows no evidence of ulcers and pulses are normal.       Assessment & Plan:  Type 2 diabetes mellitus treated with Tyler Aas.  Level is 9.4% and could be improved I am adding Januvia.  I would like for him to return to see endocrinologist but he does not want to go at this point in time so we will try this and follow-up in February.  Erectile dysfunction refill Viagra  Hypertension stable on current regimen  Hyperlipidemia-stable on statin medication.  Liver functions are normal.  Plan: Has appointment for follow-up early February.

## 2019-03-02 ENCOUNTER — Telehealth: Payer: Self-pay | Admitting: Internal Medicine

## 2019-03-02 MED ORDER — TRESIBA FLEXTOUCH 100 UNIT/ML ~~LOC~~ SOPN: 30.0000 [IU] | PEN_INJECTOR | Freq: Every day | SUBCUTANEOUS | 3 refills | Status: DC

## 2019-03-02 MED ORDER — TRESIBA FLEXTOUCH 100 UNIT/ML ~~LOC~~ SOPN: 30.0000 [IU] | PEN_INJECTOR | Freq: Every day | SUBCUTANEOUS | 5 refills | Status: DC

## 2019-03-02 NOTE — Telephone Encounter (Signed)
Troy Gordon [phone]  [name] called to say he is down to one pen, and needs refills, he normally gets it from [name], but not sure if it will get here before he runs out. Can we send refill to [facility] to get him thru until mail order gets here, however he will need RX sent to [facility] also.  [name], [name] Phone:  [phone]  Fax:  [name] [address] [place] [address] Phone:  [phone]  Fax:  [phone]      TRESIBA FLEXTOUCH 100 UNIT/ML SOPN FlexTouch Pen

## 2019-03-02 NOTE — Telephone Encounter (Signed)
[facility]ill to both [facility] and [facility]

## 2019-03-10 ENCOUNTER — Other Ambulatory Visit: Payer: Self-pay | Admitting: Internal Medicine

## 2019-04-13 ENCOUNTER — Other Ambulatory Visit: Payer: BC Managed Care – PPO | Admitting: Internal Medicine

## 2019-04-17 ENCOUNTER — Ambulatory Visit: Payer: BC Managed Care – PPO | Admitting: Internal Medicine

## 2019-04-20 ENCOUNTER — Encounter: Payer: Self-pay | Admitting: Internal Medicine

## 2019-04-20 ENCOUNTER — Telehealth: Payer: Self-pay | Admitting: Internal Medicine

## 2019-04-20 NOTE — Telephone Encounter (Signed)
Attempted to call patient to see why he missed his lab and 2 month recheck appointments, unable to reach phone is no longer available.

## 2019-04-20 NOTE — Telephone Encounter (Signed)
Mailed letter about No show and sent in MyChart.

## 2019-05-13 ENCOUNTER — Other Ambulatory Visit: Payer: Self-pay | Admitting: Internal Medicine

## 2019-05-18 ENCOUNTER — Telehealth: Payer: Self-pay

## 2019-05-18 NOTE — Telephone Encounter (Signed)
Patient called he is feeling anxious and depressed he said he is at home alone and has no one talk to. He called to get a referral somewhere were he can get help. I advised patient that psychiatric practices here require that patient calls and books his own appointment. I gave him the number to [facility], [facility] and Dr. Dellia Cloud. I told him if at any time he feels like hurting himself or someone else he needs to call 911 to get help.

## 2019-05-18 NOTE — Telephone Encounter (Signed)
OK agree with that disposition

## 2019-05-21 NOTE — Telephone Encounter (Signed)
I have contacted patient by phone just now. He says he is currently not suicidal.Says job stress and finances have gotten to him. Having trouble concentrating at work. Denies being suicidal at this point in time. I have called [facility] and made him aware of this conversation. Patient was offered Behavioral Health as an option, [facility] or even [facility] ED since he is living in [place]. He was offered [employer] [facility] of the [place] for counseling and phone number provided. He says as of Tuesday he is on FMLA from work.

## 2019-05-21 NOTE — Telephone Encounter (Signed)
[place] [employer] at [employer]   [phone] - cell [phone] - work  Banker called to say he had been on phone with patient and that he was having suicidal ideations, and that he had contacted the [facility] unit and that they were going out. [place] would like to talk with you, part of their protocol is to contact PCP in these situations.

## 2019-05-22 ENCOUNTER — Encounter: Payer: Self-pay | Admitting: Internal Medicine

## 2019-05-22 ENCOUNTER — Telehealth: Payer: Self-pay | Admitting: Internal Medicine

## 2019-05-22 DIAGNOSIS — F321 Major depressive disorder, single episode, moderate: Secondary | ICD-10-CM | POA: Insufficient documentation

## 2019-05-22 MED ORDER — BUPROPION HCL ER (XL) 150 MG PO TB24: 150.0000 mg | ORAL_TABLET | Freq: Every day | ORAL | 0 refills | Status: DC

## 2019-05-22 NOTE — Telephone Encounter (Signed)
LVM again to call office as soon as possible.

## 2019-05-22 NOTE — Telephone Encounter (Signed)
Troy Gordon called back to say he was not able to get in touch with patient either, so he has contacted mobile crisis unit.

## 2019-05-22 NOTE — Telephone Encounter (Signed)
We have called patient a couple of times today to touch base. Got voice mail and we left messages for him to call us and check-in. He called about 3:15pm and said he had been with a friend in the ministry today. Feeling better but still depressed. Says he is tired more than worried. Will call in Wellbutrin XL 150 mg daily and ask patient to call us in 2 weeks or sooner if necessary. Patient thanked me for calling him. Says he will be receiving papers in the mail from work that need completion.

## 2019-05-22 NOTE — Telephone Encounter (Signed)
LVM for patient to CB.

## 2019-05-22 NOTE — Telephone Encounter (Signed)
LVM for patient to Call office

## 2019-05-26 NOTE — Telephone Encounter (Signed)
Patient did call back and speak with Dr Lenord Fellers

## 2019-06-02 DIAGNOSIS — F329 Major depressive disorder, single episode, unspecified: Secondary | ICD-10-CM | POA: Diagnosis not present

## 2019-06-04 ENCOUNTER — Telehealth: Payer: Self-pay | Admitting: Internal Medicine

## 2019-06-04 NOTE — Telephone Encounter (Signed)
Faxed 1 year of medical records to [facility] in [place] phone [phone], fax [phone]

## 2019-06-09 ENCOUNTER — Telehealth: Payer: Self-pay | Admitting: Internal Medicine

## 2019-06-09 NOTE — Telephone Encounter (Signed)
Received FMLA paperwork from [place] to be filled out. 06/02/2019. We faxed back to [facility] with Dr Lenord Fellers completing what she could along with a letter of what happened on 06/04/2019.We have not seen patient since 02/19/2019. We received phone call from patient on 05/18/2019 wanting some information and places to contact for depression. Then received phone call from [place] on 05/21/2019, that we needed to talk with patient, we tried for hours and finally Dr Lenord Fellers spoke with patient, no plan care, no office visit. Also faxed to [facility] that patient was going to be seen at [facility] along with their information on 06/05/2019. Today we have received another request from [place] wanting notes from 05/19/2019. I have once again faxed them a note stating we have not seen patient, unable to send them anything.

## 2019-06-11 ENCOUNTER — Telehealth: Payer: Self-pay | Admitting: Internal Medicine

## 2019-06-11 NOTE — Telephone Encounter (Signed)
LVM for patient to call office, need to talk about prescription and schedule office visit for next week to discuss paperwork and see how he is doing.

## 2019-06-11 NOTE — Telephone Encounter (Signed)
Troy Gordon [phone]  A[place] called to say he had just left [facility] and they advised him to get in touch with you about medication. He stated he did not pick up what you had called in for him, because could not afford it then and that pharmacy said it would have to be sent in again. [place] also ask about FMLA paperwork, I let him know we could not fill it out because we have not seen him and that you did not put him out of work. I let him know he would need office visit to be treated before any paperwork could be filled out.

## 2019-06-11 NOTE — Telephone Encounter (Signed)
LVM for patient to call office to discuss medication and schedule office visit for next week.

## 2019-06-17 ENCOUNTER — Other Ambulatory Visit: Payer: Self-pay

## 2019-06-17 ENCOUNTER — Telehealth: Payer: Self-pay | Admitting: Internal Medicine

## 2019-06-17 ENCOUNTER — Ambulatory Visit (INDEPENDENT_AMBULATORY_CARE_PROVIDER_SITE_OTHER): Payer: BC Managed Care – PPO | Admitting: Internal Medicine

## 2019-06-17 ENCOUNTER — Encounter: Payer: Self-pay | Admitting: Internal Medicine

## 2019-06-17 VITALS — BP 160/100 | HR 68 | Temp 98.0°F | Ht 68.0 in | Wt 184.0 lb

## 2019-06-17 DIAGNOSIS — F419 Anxiety disorder, unspecified: Secondary | ICD-10-CM | POA: Diagnosis not present

## 2019-06-17 DIAGNOSIS — Z794 Long term (current) use of insulin: Secondary | ICD-10-CM | POA: Diagnosis not present

## 2019-06-17 DIAGNOSIS — F329 Major depressive disorder, single episode, unspecified: Secondary | ICD-10-CM | POA: Diagnosis not present

## 2019-06-17 DIAGNOSIS — E1165 Type 2 diabetes mellitus with hyperglycemia: Secondary | ICD-10-CM

## 2019-06-17 DIAGNOSIS — F32A Depression, unspecified: Secondary | ICD-10-CM

## 2019-06-17 LAB — POCT URINALYSIS DIPSTICK
Appearance: NEGATIVE
Bilirubin, UA: NEGATIVE
Blood, UA: NEGATIVE
Glucose, UA: POSITIVE — AB
Ketones, UA: NEGATIVE
Leukocytes, UA: NEGATIVE
Nitrite, UA: NEGATIVE
Odor: NEGATIVE
Protein, UA: POSITIVE — AB
Spec Grav, UA: 1.01 (ref 1.010–1.025)
Urobilinogen, UA: 0.2 E.U./dL
pH, UA: 6 (ref 5.0–8.0)

## 2019-06-17 LAB — POCT GLUCOSE (DEVICE FOR HOME USE): POC Glucose: 266 mg/dl — AB (ref 70–99)

## 2019-06-17 NOTE — Progress Notes (Addendum)
Subjective:    Patient ID: Troy Gordon, male    DOB: April 12, 1965, 54 y.o.   MRN: [id number]  HPI 54 year old Male with insulin dependent diabetes seen today for follow up of anxiety and depression. Says he took insulin this am and has reordered his insulin. Having some financial difficulties as he has been out of work with anxiety and depression for a few weeks now.  We last saw him for CPE December 2020 and no mention of anxiety depression at that visit.Hgb AIC was 9.4%. he has HTN and mixed hyperlipidemia. He has been prescribed generic Lipitor 80 mg daily. He is on Zestoretic 20/25 for HTN. He is on Triseba Flextouch 30 units to be taken at 10 pm but did not take until this am.  He is accompanied by Troy Gordon, friend, phone # [phone] who is also on his DPR.   Went to [facility] in [place] yesterday and saw Troy Gordon there-phone #  [phone], [place] [phone]  who was concerned about him. I spoke with her today. She says there are no inpatient facilities in the [place] area.  We received paperwork from [place] regarding disability leave from work . We completed paperwork as best we could but had not evaluated patient except by phone.   Today is the first OV we have had with him for this problem. There have been several phone calls and even prescribed Wellbutrin generic XL 150 mg daily  on March 12 which he says he could not afford. I called crisis line at [facility] today and they are willing to see him today but he does not want to go.  There may have been some issues with crack and alcohol use recently. A urine drug screen will be sent today.  Patient says another shipment of insulin from mail order is due in this week and he needs time to sort out his affairs. He is trying to get some assistance with utilities and mortgage payment. He needs to provide them with pay stub he says and some other info. Denies being suicidal at the  present time. Advised patient to go to [facility] tomorrow for evaluation and possible in-patient care vs out-patient care. I am writing a note to employer taking him out of work for the present time. I have made it clear to patient he is to follow  through with this before the end of the week.    Review of Systems see above     Objective:   Physical Exam VS reviewed. BP elevated.  His affect is somewhat flat but is agreeable to assistance. Says things have been rough. Troy Gordon was able to get him to admit crack and alcohol use to me which I was not aware of until today. Has thought about jumping off bridge a feww weeks ago but could not go through with it. Denies suicidal thoughts today. Alert and oriented and appropriately dressed with normal thought process.  [place] is 266    Assessment & Plan:  Anxiety and depression  Insulin-dependent diabetes mellitus  Essential hypertension  Hyperlipidemia  Plan: Patient referred to behavioral [place].  He says he will go tomorrow but not today as he has to take care of some personal affairs.  I expect him to comply with this plan.  Some 40 minutes spent with patient and his friend, Troy Gordon.  Also time spent contacting behavioral health to see what their policies are [place] urgent  admission.  Addendum: As of today April 15,2021 there is no record of patient being seen for evaluation at [facility] as we discussed at office visit April 7th. [email or web address]

## 2019-06-17 NOTE — Telephone Encounter (Signed)
Patient called back on 06/16/2019 and scheduled appointment.

## 2019-06-17 NOTE — Telephone Encounter (Signed)
Patient left information   [facility] [employer] [facility] [address] [place], [place] [address] Phone [phone] Fax [phone]  After hours numbers  [phone] -- Floydene Flock [phone] ([employer])  [phone] ext [phone] Romelle Starcher

## 2019-06-17 NOTE — Patient Instructions (Addendum)
Patient will be written out of work pending evaluation at [facility].  He must follow through with this plan or we will not be able to continue to be his primary care physician.

## 2019-06-17 NOTE — Telephone Encounter (Signed)
Patient came in for office visit today, so I have faxed to [employer] office notes, work note to be out of work, and phone notes. Fax [phone], office number [phone].  Claim # [id number]

## 2019-06-19 ENCOUNTER — Telehealth: Payer: Self-pay | Admitting: Internal Medicine

## 2019-06-19 NOTE — Telephone Encounter (Signed)
LVM for patient to call and let us know if he had gone to [facility] on Wednesday and how it went. Make sure that they fax office visit notes to Dr Lenord Fellers.

## 2019-06-20 LAB — PAIN MGMT, PROFILE 8 W/CONF, U
6 Acetylmorphine: NEGATIVE ng/mL
Alcohol Metabolites: NEGATIVE ng/mL (ref <500)
Amphetamines: NEGATIVE ng/mL
Benzodiazepines: NEGATIVE ng/mL
Benzoylecgonine: 98048 ng/mL
Buprenorphine, Urine: NEGATIVE ng/mL
Cocaine Metabolite: POSITIVE ng/mL
Creatinine: 111.9 mg/dL
MDMA: NEGATIVE ng/mL
Marijuana Metabolite: 65 ng/mL
Marijuana Metabolite: POSITIVE ng/mL
Opiates: NEGATIVE ng/mL
Oxidant: NEGATIVE ug/mL
Oxycodone: NEGATIVE ng/mL
pH: 5.3 (ref 4.5–9.0)

## 2019-10-14 ENCOUNTER — Telehealth: Payer: Self-pay | Admitting: Internal Medicine

## 2019-10-14 NOTE — Telephone Encounter (Signed)
Faxed Medical Records request to [facility]  Fax [phone] Fax [phone]  Now goes to  [address] [place], [place] [address]  [address]  05 pages --03/12/18 to present

## 2019-10-22 ENCOUNTER — Telehealth: Payer: Self-pay | Admitting: Internal Medicine

## 2019-10-22 NOTE — Telephone Encounter (Signed)
Received Fax RX request from  Pharmacy -  [facility] - [place], [place] - [address] Phone:  [phone]  Fax:  [phone]       Medication - TRESIBA FLEXTOUCH 100 UNIT/ML SOPN FlexTouch Pen   Last Refill -   Last OV - 06/17/2019  Last CPE - 02/19/2019  Next Appointment -

## 2019-10-22 NOTE — Telephone Encounter (Signed)
Refuse- patient needs appt

## 2019-10-23 NOTE — Telephone Encounter (Signed)
Unable to leave message

## 2019-10-31 ENCOUNTER — Other Ambulatory Visit: Payer: Self-pay | Admitting: Internal Medicine

## 2019-11-07 ENCOUNTER — Telehealth: Payer: Self-pay | Admitting: Internal Medicine

## 2019-11-07 NOTE — Telephone Encounter (Signed)
Received request for refill on Tresiba. Unable to fill because patient needs appt. Tried phone number on file and seems to belong to another individual who is not the patient. Rx Refused to [facility] sent via fax.

## 2019-11-29 ENCOUNTER — Emergency Department (HOSPITAL_COMMUNITY)
Admission: EM | Admit: 2019-11-29 | Discharge: 2019-11-29 | Disposition: A | Discharge status: Home / Self Care | Payer: BLUE CROSS/BLUE SHIELD | Attending: Emergency Medicine | Admitting: Emergency Medicine

## 2019-11-29 ENCOUNTER — Other Ambulatory Visit: Payer: Self-pay

## 2019-11-29 ENCOUNTER — Encounter (HOSPITAL_COMMUNITY): Payer: Self-pay | Admitting: *Deleted

## 2019-11-29 DIAGNOSIS — Z79899 Other long term (current) drug therapy: Secondary | ICD-10-CM | POA: Insufficient documentation

## 2019-11-29 DIAGNOSIS — F17200 Nicotine dependence, unspecified, uncomplicated: Secondary | ICD-10-CM | POA: Insufficient documentation

## 2019-11-29 DIAGNOSIS — I10 Essential (primary) hypertension: Secondary | ICD-10-CM | POA: Insufficient documentation

## 2019-11-29 DIAGNOSIS — E782 Mixed hyperlipidemia: Secondary | ICD-10-CM | POA: Insufficient documentation

## 2019-11-29 DIAGNOSIS — Z794 Long term (current) use of insulin: Secondary | ICD-10-CM | POA: Insufficient documentation

## 2019-11-29 DIAGNOSIS — R739 Hyperglycemia, unspecified: Secondary | ICD-10-CM

## 2019-11-29 DIAGNOSIS — Z760 Encounter for issue of repeat prescription: Secondary | ICD-10-CM | POA: Insufficient documentation

## 2019-11-29 DIAGNOSIS — E1169 Type 2 diabetes mellitus with other specified complication: Secondary | ICD-10-CM | POA: Insufficient documentation

## 2019-11-29 DIAGNOSIS — E1165 Type 2 diabetes mellitus with hyperglycemia: Secondary | ICD-10-CM | POA: Insufficient documentation

## 2019-11-29 LAB — CBC WITH DIFFERENTIAL/PLATELET
Abs Immature Granulocytes: 0.02 10*3/uL (ref 0.00–0.07)
Basophils Absolute: 0 10*3/uL (ref 0.0–0.1)
Basophils Relative: 0 %
Eosinophils Absolute: 0.1 10*3/uL (ref 0.0–0.5)
Eosinophils Relative: 2 %
HCT: 46.2 % (ref 39.0–52.0)
Hemoglobin: 15.2 g/dL (ref 13.0–17.0)
Immature Granulocytes: 0 %
Lymphocytes Relative: 35 %
Lymphs Abs: 2.3 10*3/uL (ref 0.7–4.0)
MCH: 27.7 pg (ref 26.0–34.0)
MCHC: 32.9 g/dL (ref 30.0–36.0)
MCV: 84.2 fL (ref 80.0–100.0)
Monocytes Absolute: 0.4 10*3/uL (ref 0.1–1.0)
Monocytes Relative: 6 %
Neutro Abs: 3.7 10*3/uL (ref 1.7–7.7)
Neutrophils Relative %: 57 %
Platelets: 140 10*3/uL — ABNORMAL LOW (ref 150–400)
RBC: 5.49 MIL/uL (ref 4.22–5.81)
RDW: 12.7 % (ref 11.5–15.5)
WBC: 6.5 10*3/uL (ref 4.0–10.5)
nRBC: 0 % (ref 0.0–0.2)

## 2019-11-29 LAB — COMPREHENSIVE METABOLIC PANEL
ALT: 19 U/L (ref 0–44)
AST: 18 U/L (ref 15–41)
Albumin: 3.8 g/dL (ref 3.5–5.0)
Alkaline Phosphatase: 59 U/L (ref 38–126)
Anion gap: 10 (ref 5–15)
BUN: 16 mg/dL (ref 6–20)
CO2: 23 mmol/L (ref 22–32)
Calcium: 9.1 mg/dL (ref 8.9–10.3)
Chloride: 101 mmol/L (ref 98–111)
Creatinine, Ser: 1.67 mg/dL — ABNORMAL HIGH (ref 0.61–1.24)
GFR calc Af Amer: 53 mL/min — ABNORMAL LOW (ref >60)
GFR calc non Af Amer: 45 mL/min — ABNORMAL LOW (ref >60)
Glucose, Bld: 382 mg/dL — ABNORMAL HIGH (ref 70–99)
Potassium: 3.9 mmol/L (ref 3.5–5.1)
Sodium: 134 mmol/L — ABNORMAL LOW (ref 135–145)
Total Bilirubin: 0.5 mg/dL (ref 0.3–1.2)
Total Protein: 7.2 g/dL (ref 6.5–8.1)

## 2019-11-29 LAB — URINALYSIS, ROUTINE W REFLEX MICROSCOPIC
Bacteria, UA: NONE SEEN
Bilirubin Urine: NEGATIVE
Glucose, UA: >=500 mg/dL — AB
Hgb urine dipstick: NEGATIVE
Ketones, ur: NEGATIVE mg/dL
Leukocytes,Ua: NEGATIVE
Nitrite: NEGATIVE
Protein, ur: 30 mg/dL — AB
Specific Gravity, Urine: 1.027 (ref 1.005–1.030)
pH: 6 (ref 5.0–8.0)

## 2019-11-29 LAB — CBG MONITORING, ED
Glucose-Capillary: 394 mg/dL — ABNORMAL HIGH (ref 70–99)
Glucose-Capillary: 182 mg/dL — ABNORMAL HIGH (ref 70–99)

## 2019-11-29 MED ORDER — TRESIBA FLEXTOUCH 100 UNIT/ML ~~LOC~~ SOPN: 30.0000 [IU] | PEN_INJECTOR | Freq: Every day | SUBCUTANEOUS | 0 refills | Status: DC

## 2019-11-29 MED ORDER — BD PEN NEEDLE NANO 2ND GEN 32G X 4 MM MISC: 0 refills | Status: DC

## 2019-11-29 MED ORDER — INSULIN ASPART 100 UNIT/ML ~~LOC~~ SOLN
5.0000 [IU] | Freq: Once | SUBCUTANEOUS | Status: AC
  Administered 2019-11-29: 5 [IU] via INTRAVENOUS
  Filled 2019-11-29: qty 1

## 2019-11-29 MED ORDER — SODIUM CHLORIDE 0.9 % IV BOLUS
1000.0000 mL | Freq: Once | INTRAVENOUS | Status: AC
  Administered 2019-11-29: 1000 mL via INTRAVENOUS

## 2019-11-29 NOTE — ED Provider Notes (Signed)
[facility] EMERGENCY DEPARTMENT Provider Note   CSN: [id number] Arrival date & time: 11/29/19  1940     History Chief Complaint  Patient presents with  . Hyperglycemia    Troy Gordon is a 54 y.o. male presenting for evaluation of generalized weakness and elevated blood sugars.  Patient states he ran out of insulin 3 days ago.  Since then, he has not been feeling well.  He reports generalized weakness.  He has been checking his blood sugars, they have been found to be elevated.  He is not out of any other medications.  Patient states he recently started a new job, does not have insurance.  He is looking to establish with a new PCP, but dynamic unable to do so.  He reports a history of high blood pressure, takes his medications daily but did not take it today.  He denies confusion, chest pain, shortness of breath, cough, nausea, vomiting or abdominal pain, dysuria, hematuria, abnormal bowel movements.  Additional history taken chart review.  Patient with a history of diabetes, hypertension, dyslipidemia, depression.  HPI     Past Medical History:  Diagnosis Date  . Diabetes mellitus (HCC)   . Dyslipidemia   . Hypertension     Patient Active Problem List   Diagnosis Date Noted  . Depression, major, single episode, moderate (HCC) 05/22/2019  . Hyperlipidemia associated with type 2 diabetes mellitus (HCC) 06/07/2017  . Benign essential HTN 11/20/2016  . Type 2 diabetes mellitus with hyperglycemia, with long-term current use of insulin (HCC) 11/20/2016    History reviewed. No pertinent surgical history.     Family History  Problem Relation Age of Onset  . Diabetes Maternal Grandmother     Social History   Tobacco Use  . Smoking status: Light Tobacco Smoker  . Smokeless tobacco: Never Used  Vaping Use  . Vaping Use: Never used  Substance Use Topics  . Alcohol use: Yes    Comment: occasionally  . Drug use: Never    Home Medications Prior to Admission  medications   Medication Sig Start Date End Date Taking? Authorizing Provider  atorvastatin (LIPITOR) 80 MG tablet TAKE 1 TABLET DAILY 03/10/19   Margaree Mackintosh, MD  buPROPion (WELLBUTRIN XL) 150 MG 24 hr tablet Take 1 tablet (150 mg total) by mouth daily. Patient not taking: Reported on 06/17/2019 05/22/19   Margaree Mackintosh, MD  Insulin Pen Needle (BD PEN NEEDLE NANO 2ND GEN) 32G X 4 MM MISC USE ONCE DAILY 11/29/19   Krystianna Soth, PA-C  lisinopril-hydrochlorothiazide (ZESTORETIC) 20-25 MG tablet TAKE 1 TABLET DAILY 03/10/19   Margaree Mackintosh, MD  sildenafil (VIAGRA) 100 MG tablet TAKE 1 TABLET BY MOUTH 1 HOUR BEFORE INTERCOURSE 02/19/19   Margaree Mackintosh, MD  TRESIBA FLEXTOUCH 100 UNIT/ML FlexTouch Pen Inject 30 Units into the skin daily at 10 pm. 11/29/19   Mykal Batiz, PA-C    Allergies    Patient has no known allergies.  Review of Systems   Review of Systems  Neurological: Positive for weakness.  All other systems reviewed and are negative.   Physical Exam Updated Vital Signs BP (!) 178/100 (BP Location: Right Arm)   Pulse 67   Temp 98.7 F (37.1 C) (Oral)   Resp 18   Ht 5\' 8"  (1.727 m)   Wt 81.6 kg   SpO2 99%   BMI 27.37 kg/m   Physical Exam Vitals and nursing note reviewed.  Constitutional:      General: He is  not in acute distress.    Appearance: He is well-developed.     Comments: Resting in the bed in no acute distress  HENT:     Head: Normocephalic and atraumatic.  Eyes:     Conjunctiva/sclera: Conjunctivae normal.     Pupils: Pupils are equal, round, and reactive to light.  Cardiovascular:     Rate and Rhythm: Normal rate and regular rhythm.     Pulses: Normal pulses.  Pulmonary:     Effort: Pulmonary effort is normal. No respiratory distress.     Breath sounds: Normal breath sounds. No wheezing.     Comments: Clear lung sounds in all fields Abdominal:     General: There is no distension.     Palpations: Abdomen is soft. There is no mass.      Tenderness: There is no abdominal tenderness. There is no guarding or rebound.  Musculoskeletal:        General: Normal range of motion.     Cervical back: Normal range of motion and neck supple.  Skin:    General: Skin is warm and dry.     Capillary Refill: Capillary refill takes less than 2 seconds.  Neurological:     Mental Status: He is alert and oriented to person, place, and time.     ED Results / Procedures / Treatments   Labs (all labs ordered are listed, but only abnormal results are displayed) Labs Reviewed  CBC WITH DIFFERENTIAL/PLATELET - Abnormal; Notable for the following components:      Result Value   Platelets 140 (*)    All other components within normal limits  URINALYSIS, ROUTINE W REFLEX MICROSCOPIC - Abnormal; Notable for the following components:   Glucose, UA >=500 (*)    Protein, ur 30 (*)    All other components within normal limits  COMPREHENSIVE METABOLIC PANEL - Abnormal; Notable for the following components:   Sodium 134 (*)    Glucose, Bld 382 (*)    Creatinine, Ser 1.67 (*)    GFR calc non Af Amer 45 (*)    GFR calc Af Amer 53 (*)    All other components within normal limits  CBG MONITORING, ED - Abnormal; Notable for the following components:   Glucose-Capillary 394 (*)    All other components within normal limits    EKG None  Radiology No results found.  Procedures Procedures (including critical care time)  Medications Ordered in ED Medications  sodium chloride 0.9 % bolus 1,000 mL (1,000 mLs Intravenous New Bag/Given 11/29/19 2158)  insulin aspart (novoLOG) injection 5 Units (5 Units Intravenous Given 11/29/19 2158)    ED Course  I have reviewed the triage vital signs and the nursing notes.  Pertinent labs & imaging results that were available during my care of the patient were reviewed by me and considered in my medical decision making (see chart for details).    MDM Rules/Calculators/A&P                          Patient  presenting for evaluation of generalized weakness and requesting refill of his insulin.  On exam, patient peers nontoxic.  Labs obtained from triage show hyperglycemia without signs of DKA.  Urine is pending.  Discussed with patient generalized weakness is likely due to elevated blood sugars.  Will refill and consult with transition of care/social work team to help patient establish with PCP.  Patient's blood pressure is mildly elevated today, this is  likely due to not taking his blood pressure medicines this morning.  Remaining labs are reassuring, no leukocytosis.  Hemoglobin stable.  Electrolytes stable.   AU negative for infection. I do not believe he needs to be admitted at this time or have further emergent work-up, as I feel his weakness is likely due to hypoglycemia.   On reassessment, patient reports he is feeling much better after insulin.  Repeat CBG 182.  At this time, patient appears safe for discharge.  Return precautions given.  Patient states he understands and agrees to plan.  Final Clinical Impression(s) / ED Diagnoses Final diagnoses:  Hyperglycemia  Medication refill    Rx / DC Orders ED Discharge Orders         Ordered    TRESIBA FLEXTOUCH 100 UNIT/ML FlexTouch Pen  Daily at 10 pm        11/29/19 2225    Insulin Pen Needle (BD PEN NEEDLE NANO 2ND GEN) 32G X 4 MM MISC        11/29/19 2225           Alveria Apley, PA-C 11/29/19 2251    Vanetta Mulders, MD 12/18/19 1617

## 2019-11-29 NOTE — Discharge Instructions (Addendum)
I have asked our social work team to reach out to to help set up primary care.  If you do not hear from them, you may also call the 800-number in the paperwork to help set up primary care. You had been given a prescription for your insulin needles, take as prescribed. Make sure stay well-hydrated water. Return to the emergency room with any new, worsening, concerning symptoms.

## 2019-11-29 NOTE — ED Triage Notes (Signed)
Pt out of insulin for the past 3 days.  States blood sugar has been high.  Also c/o generalized weakness.

## 2019-12-04 ENCOUNTER — Encounter: Payer: Self-pay | Admitting: Internal Medicine

## 2019-12-17 ENCOUNTER — Other Ambulatory Visit: Payer: Self-pay | Admitting: Physician Assistant

## 2019-12-17 ENCOUNTER — Other Ambulatory Visit: Payer: Self-pay

## 2019-12-17 ENCOUNTER — Ambulatory Visit: Payer: Self-pay | Admitting: Physician Assistant

## 2019-12-17 ENCOUNTER — Encounter: Payer: Self-pay | Admitting: Physician Assistant

## 2019-12-17 VITALS — BP 130/90 | HR 69 | Temp 97.3°F | Ht 67.75 in | Wt 181.0 lb

## 2019-12-17 DIAGNOSIS — I10 Essential (primary) hypertension: Secondary | ICD-10-CM

## 2019-12-17 DIAGNOSIS — Z7689 Persons encountering health services in other specified circumstances: Secondary | ICD-10-CM

## 2019-12-17 DIAGNOSIS — Z125 Encounter for screening for malignant neoplasm of prostate: Secondary | ICD-10-CM

## 2019-12-17 DIAGNOSIS — Z1211 Encounter for screening for malignant neoplasm of colon: Secondary | ICD-10-CM

## 2019-12-17 DIAGNOSIS — E1165 Type 2 diabetes mellitus with hyperglycemia: Secondary | ICD-10-CM

## 2019-12-17 DIAGNOSIS — E785 Hyperlipidemia, unspecified: Secondary | ICD-10-CM

## 2019-12-17 MED ORDER — SITAGLIPTIN PHOSPHATE 100 MG PO TABS: 100.0000 mg | ORAL_TABLET | Freq: Every day | ORAL | 0 refills | Status: DC

## 2019-12-17 MED ORDER — LANTUS SOLOSTAR 100 UNIT/ML ~~LOC~~ SOPN: 30.0000 [IU] | PEN_INJECTOR | Freq: Every day | SUBCUTANEOUS | 99 refills | Status: DC

## 2019-12-17 MED ORDER — LISINOPRIL-HYDROCHLOROTHIAZIDE 20-25 MG PO TABS
1.0000 | ORAL_TABLET | Freq: Every day | ORAL | 0 refills | Status: DC
Start: 2019-12-17 — End: 2020-10-28

## 2019-12-17 NOTE — Progress Notes (Signed)
BP 130/90   Pulse 69   Temp (!) 97.3 F (36.3 C)   Ht 5' 7.75" (1.721 m)   Wt 181 lb (82.1 kg)   SpO2 95%   BMI 27.72 kg/m    Subjective:    Patient ID: Troy Gordon, male    DOB: 1965-04-04, 54 y.o.   MRN: [id number]  HPI: Troy Gordon is a 54 y.o. male presenting on 12/17/2019 for New Patient (Initial Visit) (pt is here  to establsih care. Previous pt with Dr. Marlan Palau. pt moved from [place] to [place] and lost his medical insurance as to reason he is changing PCP. pt has been out of his [place] for 3 days)   HPI   Pt had a negative covid 19 screening questionnaire.   Pt is 54yoM who presents to establish care.  He was last seen by his previous PCP in April.  He was diagnosed with DM in 2009 at age 32.   He says his fbs were running around 195 before he ran out of his insulin.  He was intolerant of metformin due to GI side effects.  He was on [place] in the past as well.   He doesn't recall having any problems with that medication.   He was seen by endocrinologist , last seen march 2020.    Last a1c was dec 2020 - 9.4.  Pt says he was on lantus in the past and liked it.  He says he was only changed to tresiba due to insurance issues.  His last DM foot exam was in december 2020.    Epic shows Colonoscopy done 2018.  Further investigation reveals improper documentation; he did not have a colonoscopy but instead did stool cards.  Pt confirms that he has never had a colonoscopy.    He says his Last eye exam was about a year ago at [facility] in [place], he thinks the friendly location, but isn't certain.  Pt has Not gotten vaccinated for covid.  Pt does report depression.  He says he tried counseling but say he didn't follow through/continue going.  He went to [facility].   He says that the co-pays were too high due to the insurance he had.  He says the welbutrin is not working too well for him.  He Never tried anything else.  He does feel like he needs something.  He  denies SI, HI.   He thinks he is still in process for [facility].  He has submitted paperwork but has not received a letter of approval.  Pt's appointment was interrupted multiple times due to pt telephone receiving calls and texts.      Relevant past medical, surgical, family and social history reviewed and updated as indicated. Interim medical history since our last visit reviewed. Allergies and medications reviewed and updated.   Current Outpatient Medications:  .  buPROPion (WELLBUTRIN XL) 150 MG 24 hr tablet, Take 1 tablet (150 mg total) by mouth daily., Disp: 30 tablet, Rfl: 0 .  Insulin Pen Needle (BD PEN NEEDLE NANO 2ND GEN) 32G X 4 MM MISC, USE ONCE DAILY, Disp: 100 each, Rfl: 0 .  lisinopril-hydrochlorothiazide (ZESTORETIC) 20-25 MG tablet, TAKE 1 TABLET DAILY, Disp: 90 tablet, Rfl: 3 .  atorvastatin (LIPITOR) 80 MG tablet, TAKE 1 TABLET DAILY (Patient not taking: Reported on 12/17/2019), Disp: 90 tablet, Rfl: 3 .  sildenafil (VIAGRA) 100 MG tablet, TAKE 1 TABLET BY MOUTH 1 HOUR BEFORE INTERCOURSE (Patient not taking: Reported on 12/17/2019), Disp: 30  tablet, Rfl: 3 .  TRESIBA FLEXTOUCH 100 UNIT/ML FlexTouch Pen, Inject 30 Units into the skin daily at 10 pm. (Patient not taking: Reported on 12/17/2019), Disp: 15 mL, Rfl: 0    Review of Systems  Per HPI unless specifically indicated above     Objective:    BP 130/90   Pulse 69   Temp (!) 97.3 F (36.3 C)   Ht 5' 7.75" (1.721 m)   Wt 181 lb (82.1 kg)   SpO2 95%   BMI 27.72 kg/m   Wt Readings from Last 3 Encounters:  12/17/19 181 lb (82.1 kg)  11/29/19 180 lb (81.6 kg)  06/17/19 184 lb (83.5 kg)    Physical Exam Vitals reviewed.  Constitutional:      General: He is not in acute distress.    Appearance: He is well-developed. He is not ill-appearing.  HENT:     Head: Normocephalic and atraumatic.     Right Ear: Tympanic membrane, ear canal and external ear normal.     Left Ear: Tympanic membrane, ear canal and  external ear normal.  Eyes:     Conjunctiva/sclera: Conjunctivae normal.     Pupils: Pupils are equal, round, and reactive to light.  Neck:     Thyroid: No thyromegaly.  Cardiovascular:     Rate and Rhythm: Normal rate and regular rhythm.  Pulmonary:     Effort: Pulmonary effort is normal.     Breath sounds: Normal breath sounds. No wheezing or rales.  Abdominal:     General: Bowel sounds are normal.     Palpations: Abdomen is soft. There is no mass.     Tenderness: There is no abdominal tenderness.  Musculoskeletal:     Cervical back: Neck supple.     Right lower leg: No edema.     Left lower leg: No edema.  Lymphadenopathy:     Cervical: No cervical adenopathy.  Skin:    General: Skin is warm and dry.     Findings: No rash.  Neurological:     Mental Status: He is alert and oriented to person, place, and time.  Psychiatric:        Attention and Perception: Attention normal.        Speech: Speech normal.        Behavior: Behavior normal. Behavior is cooperative.     Comments: Pleasant.  Converses easily.           Assessment & Plan:   Encounter Diagnoses  Name Primary?  . Encounter to establish care Yes  . Uncontrolled type 2 diabetes mellitus with hyperglycemia (HCC)   . Primary hypertension   . Hyperlipidemia, unspecified hyperlipidemia type   . Screening for prostate cancer   . Screening for colon cancer       -will get baseline Labs -pt is given sample lantus Insulin.  He is to continue 30units qd.  He is given sample [place].  He is to monitor fbs and bring bs log with him to his next appointment -pt is given ifobt for colon cancer screening -Record request sent for his most recent eye exam. -pt is educated and encouraged to Get covid vaccination -when pt get his labs, will send rx to [facility]- januvia, atorvastatin, lisinpril/hctz -he is signed up for PAP for his lantus.   -he is encouraged to return to [facility] for counseling and to try to find an  antidepressant that works for him -pt to follow up 3 wk to review labs and bs log.  He is to contact office sooner prn

## 2019-12-23 ENCOUNTER — Other Ambulatory Visit (HOSPITAL_COMMUNITY)
Admission: RE | Admit: 2019-12-23 | Discharge: 2019-12-23 | Disposition: A | Discharge status: Home / Self Care | Payer: Self-pay | Source: Ambulatory Visit | Attending: Physician Assistant | Admitting: Physician Assistant

## 2019-12-23 ENCOUNTER — Telehealth: Payer: Self-pay | Admitting: Physician Assistant

## 2019-12-23 DIAGNOSIS — Z125 Encounter for screening for malignant neoplasm of prostate: Secondary | ICD-10-CM | POA: Insufficient documentation

## 2019-12-23 DIAGNOSIS — E1165 Type 2 diabetes mellitus with hyperglycemia: Secondary | ICD-10-CM | POA: Insufficient documentation

## 2019-12-23 LAB — LIPID PANEL
Cholesterol: 180 mg/dL (ref 0–200)
HDL: 48 mg/dL (ref >40)
LDL Cholesterol: 116 mg/dL — ABNORMAL HIGH (ref 0–99)
Total CHOL/HDL Ratio: 3.8 RATIO
Triglycerides: 78 mg/dL (ref <150)
VLDL: 16 mg/dL (ref 0–40)

## 2019-12-23 LAB — PSA: Prostatic Specific Antigen: 0.57 ng/mL (ref 0.00–4.00)

## 2019-12-23 NOTE — Telephone Encounter (Signed)
Faxed Medical Records to [facility] [phone] Pages)

## 2019-12-24 LAB — HEMOGLOBIN A1C
Hgb A1c MFr Bld: 11.7 % — ABNORMAL HIGH (ref 4.8–5.6)
Mean Plasma Glucose: 289 mg/dL

## 2019-12-24 LAB — MICROALBUMIN, URINE: Microalb, Ur: 25.1 ug/mL — ABNORMAL HIGH

## 2019-12-28 LAB — IFOBT (OCCULT BLOOD): IFOBT: NEGATIVE

## 2020-01-07 ENCOUNTER — Ambulatory Visit: Payer: Self-pay | Admitting: Physician Assistant

## 2020-01-11 NOTE — Telephone Encounter (Signed)
[facility] received records, we received pmt °

## 2020-01-18 ENCOUNTER — Ambulatory Visit: Payer: Medicaid Other | Admitting: Physician Assistant

## 2020-01-18 ENCOUNTER — Encounter: Payer: Self-pay | Admitting: Physician Assistant

## 2020-01-19 ENCOUNTER — Telehealth: Payer: Self-pay

## 2020-01-19 ENCOUNTER — Encounter: Payer: Self-pay | Admitting: Student

## 2020-01-19 NOTE — Telephone Encounter (Signed)
Called to follow up with client after labs and missed appointment with [facility] on 01[facility]18/20. Unable to leave a voicemail. . Will continue to reach client to discuss rescheduling of appointment, the importance of follow-up care and to call if he cannot make his appointments.  Francee Nodal RN Case Manager [facility]

## 2020-04-04 ENCOUNTER — Other Ambulatory Visit: Payer: Self-pay | Admitting: Physician Assistant

## 2020-04-04 DIAGNOSIS — I10 Essential (primary) hypertension: Secondary | ICD-10-CM

## 2020-04-04 DIAGNOSIS — E785 Hyperlipidemia, unspecified: Secondary | ICD-10-CM

## 2020-04-04 DIAGNOSIS — E1165 Type 2 diabetes mellitus with hyperglycemia: Secondary | ICD-10-CM

## 2020-04-12 ENCOUNTER — Ambulatory Visit: Payer: Medicaid Other | Admitting: Physician Assistant

## 2020-04-18 ENCOUNTER — Encounter: Payer: Self-pay | Admitting: Physician Assistant

## 2020-04-18 ENCOUNTER — Ambulatory Visit: Payer: Medicaid Other | Admitting: Physician Assistant

## 2020-04-18 DIAGNOSIS — R0981 Nasal congestion: Secondary | ICD-10-CM

## 2020-04-18 DIAGNOSIS — R059 Cough, unspecified: Secondary | ICD-10-CM

## 2020-04-18 DIAGNOSIS — Z20822 Contact with and (suspected) exposure to covid-19: Secondary | ICD-10-CM

## 2020-04-18 DIAGNOSIS — J029 Acute pharyngitis, unspecified: Secondary | ICD-10-CM

## 2020-04-18 NOTE — Progress Notes (Signed)
There were no vitals taken for this visit.   Subjective:    Patient ID: Troy Gordon, male    DOB: Mar 26, 1965, 55 y.o.   MRN: [id number]  HPI: Troy Gordon is a 55 y.o. male presenting on 04/18/2020 for No chief complaint on file.   HPI   This is a telemedicine appointment through [employer] due to coronavirus pandemic.  I connected with  Troy Gordon on 04/18/2020 by a video enabled telemedicine application and verified that I am speaking with the correct person using two identifiers.   I discussed the limitations of evaluation and management by telemedicine. The patient expressed understanding and agreed to proceed.  Pt is at home.  Provider is at office.     Pt is sick.  He started feeling sick Saturday.  He has congestion and ST,  Mucus and Some cough No HA or fever.  He is currently working at [employer].     Relevant past medical, surgical, family and social history reviewed and updated as indicated. Interim medical history since our last visit reviewed. Allergies and medications reviewed and updated.   Current Outpatient Medications:  .  insulin glargine (LANTUS SOLOSTAR) 100 UNIT/ML Solostar Pen, Inject 30 Units into the skin daily., Disp: 3 mL, Rfl: PRN .  atorvastatin (LIPITOR) 80 MG tablet, TAKE 1 TABLET DAILY (Patient not taking: No sig reported), Disp: 90 tablet, Rfl: 3 .  buPROPion (WELLBUTRIN XL) 150 MG 24 hr tablet, Take 1 tablet (150 mg total) by mouth daily. (Patient not taking: Reported on 04/18/2020), Disp: 30 tablet, Rfl: 0 .  lisinopril-hydrochlorothiazide (ZESTORETIC) 20-25 MG tablet, Take 1 tablet by mouth daily. (Patient not taking: Reported on 04/18/2020), Disp: 30 tablet, Rfl: 0 .  sildenafil (VIAGRA) 100 MG tablet, TAKE 1 TABLET BY MOUTH 1 HOUR BEFORE INTERCOURSE (Patient not taking: No sig reported), Disp: 30 tablet, Rfl: 3 .  sitaGLIPtin (JANUVIA) 100 MG tablet, Take 1 tablet (100 mg total) by mouth daily. (Patient not taking: Reported on 04/18/2020), Disp:  14 tablet, Rfl: 0     Review of Systems  Per HPI unless specifically indicated above     Objective:    There were no vitals taken for this visit.  Wt Readings from Last 3 Encounters:  12/17/19 181 lb (82.1 kg)  11/29/19 180 lb (81.6 kg)  06/17/19 184 lb (83.5 kg)    Physical Exam Constitutional:      General: He is not in acute distress.    Appearance: He is not toxic-appearing.  HENT:     Head: Normocephalic and atraumatic.  Pulmonary:     Effort: No respiratory distress.     Comments: Pt is talking in complete sentences without sob Neurological:     Mental Status: He is alert and oriented to person, place, and time.  Psychiatric:        Attention and Perception: Attention normal.        Speech: Speech normal.        Behavior: Behavior is cooperative.              Assessment & Plan:    Encounter Diagnoses  Name Primary?  . Person under investigation for COVID-19 Yes  . Cough   . Head congestion   . Sore throat        -pt says he can't get to testing today due to transportation issues, he is scheduled for testing tomorrow at the [facility] building -he is counseled that he needs to self-quarantine and not go to  work or be around others until he gets his results.  A not for his employer is emailed to him

## 2020-04-19 ENCOUNTER — Ambulatory Visit: Payer: Medicaid Other | Admitting: Physician Assistant

## 2020-04-19 ENCOUNTER — Other Ambulatory Visit: Payer: Self-pay

## 2020-04-22 ENCOUNTER — Other Ambulatory Visit: Payer: Self-pay | Admitting: Internal Medicine

## 2020-04-26 ENCOUNTER — Ambulatory Visit: Payer: Medicaid Other | Admitting: Physician Assistant

## 2020-05-02 ENCOUNTER — Encounter: Payer: Self-pay | Admitting: Physician Assistant

## 2020-05-19 ENCOUNTER — Telehealth: Payer: Self-pay

## 2020-05-19 NOTE — Telephone Encounter (Signed)
Attempted to reach client to return call. [facility] per client from missing appointments. He is wanting to be connected to another provider.  Called client on number listed in Epic No answer, attempted to leave a voicemail requesting client to return call.  Francee Nodal RN Clara gunn/[facility]

## 2020-05-20 ENCOUNTER — Telehealth: Payer: Self-pay

## 2020-05-20 NOTE — Telephone Encounter (Signed)
Client called stating he was [facility]" He states he missed appointments. He is current with [facility] until 12/02/20. Discussed other Primary care options and he would like a referral sent to [facility]. Client aware that Health Department will base co pay on a sliding scale and that they will contact him with an appointment and that co pay. Client wishes to proceed.  Client is a Diabetic and states he will soon be out of insulin and has no further refills. All [facility] documents scanned and sent in a secure e-mail to Judieth Keens at the [facility] department for review and approval. Request send for an as soon as possible appointment due to need for medication.  Client notified his documents were sent and the Health department will contact him with appointment and will contact [facility] if further documentation is needed.  Client reports understanding.  Will continue to follow. Francee Nodal RN [employer]

## 2020-05-24 ENCOUNTER — Telehealth: Payer: Self-pay

## 2020-05-24 NOTE — Telephone Encounter (Signed)
[facility] client called today after his first visit with [facility]. He has questions regarding his co-payment. Emailed D. Leavy Cella Dietitian for [facility] to please follow up.  Client is agreeable.  Francee Nodal RN [employer]

## 2020-06-07 ENCOUNTER — Emergency Department (HOSPITAL_COMMUNITY)
Admission: EM | Admit: 2020-06-07 | Discharge: 2020-06-10 | Disposition: A | Discharge status: Other Acute Inpatient Hospital | Payer: Medicaid Other | Attending: Emergency Medicine | Admitting: Emergency Medicine

## 2020-06-07 ENCOUNTER — Encounter (HOSPITAL_COMMUNITY): Payer: Self-pay | Admitting: *Deleted

## 2020-06-07 ENCOUNTER — Other Ambulatory Visit: Payer: Self-pay

## 2020-06-07 DIAGNOSIS — R45850 Homicidal ideations: Secondary | ICD-10-CM | POA: Insufficient documentation

## 2020-06-07 DIAGNOSIS — Z20822 Contact with and (suspected) exposure to covid-19: Secondary | ICD-10-CM | POA: Insufficient documentation

## 2020-06-07 DIAGNOSIS — Z79899 Other long term (current) drug therapy: Secondary | ICD-10-CM | POA: Insufficient documentation

## 2020-06-07 DIAGNOSIS — I10 Essential (primary) hypertension: Secondary | ICD-10-CM | POA: Insufficient documentation

## 2020-06-07 DIAGNOSIS — F1424 Cocaine dependence with cocaine-induced mood disorder: Secondary | ICD-10-CM | POA: Insufficient documentation

## 2020-06-07 DIAGNOSIS — Z87891 Personal history of nicotine dependence: Secondary | ICD-10-CM | POA: Insufficient documentation

## 2020-06-07 DIAGNOSIS — F1490 Cocaine use, unspecified, uncomplicated: Secondary | ICD-10-CM

## 2020-06-07 DIAGNOSIS — Z7984 Long term (current) use of oral hypoglycemic drugs: Secondary | ICD-10-CM | POA: Insufficient documentation

## 2020-06-07 DIAGNOSIS — Z794 Long term (current) use of insulin: Secondary | ICD-10-CM | POA: Insufficient documentation

## 2020-06-07 DIAGNOSIS — R45851 Suicidal ideations: Secondary | ICD-10-CM

## 2020-06-07 DIAGNOSIS — E119 Type 2 diabetes mellitus without complications: Secondary | ICD-10-CM | POA: Insufficient documentation

## 2020-06-07 LAB — RAPID URINE DRUG SCREEN, HOSP PERFORMED
Amphetamines: NOT DETECTED
Barbiturates: NOT DETECTED
Benzodiazepines: NOT DETECTED
Cocaine: POSITIVE — AB
Opiates: NOT DETECTED
Tetrahydrocannabinol: NOT DETECTED

## 2020-06-07 LAB — CBC WITH DIFFERENTIAL/PLATELET
Abs Immature Granulocytes: 0.04 10*3/uL (ref 0.00–0.07)
Basophils Absolute: 0 10*3/uL (ref 0.0–0.1)
Basophils Relative: 0 %
Eosinophils Absolute: 0.1 10*3/uL (ref 0.0–0.5)
Eosinophils Relative: 1 %
HCT: 48.5 % (ref 39.0–52.0)
Hemoglobin: 16 g/dL (ref 13.0–17.0)
Immature Granulocytes: 0 %
Lymphocytes Relative: 31 %
Lymphs Abs: 3.2 10*3/uL (ref 0.7–4.0)
MCH: 27.4 pg (ref 26.0–34.0)
MCHC: 33 g/dL (ref 30.0–36.0)
MCV: 83.2 fL (ref 80.0–100.0)
Monocytes Absolute: 0.7 10*3/uL (ref 0.1–1.0)
Monocytes Relative: 6 %
Neutro Abs: 6.5 10*3/uL (ref 1.7–7.7)
Neutrophils Relative %: 62 %
Platelets: 181 10*3/uL (ref 150–400)
RBC: 5.83 MIL/uL — ABNORMAL HIGH (ref 4.22–5.81)
RDW: 13.1 % (ref 11.5–15.5)
WBC: 10.6 10*3/uL — ABNORMAL HIGH (ref 4.0–10.5)
nRBC: 0 % (ref 0.0–0.2)

## 2020-06-07 LAB — COMPREHENSIVE METABOLIC PANEL
ALT: 24 U/L (ref 0–44)
AST: 24 U/L (ref 15–41)
Albumin: 4.2 g/dL (ref 3.5–5.0)
Alkaline Phosphatase: 87 U/L (ref 38–126)
Anion gap: 12 (ref 5–15)
BUN: 19 mg/dL (ref 6–20)
CO2: 25 mmol/L (ref 22–32)
Calcium: 9.5 mg/dL (ref 8.9–10.3)
Chloride: 98 mmol/L (ref 98–111)
Creatinine, Ser: 1.34 mg/dL — ABNORMAL HIGH (ref 0.61–1.24)
GFR, Estimated: >60 mL/min (ref >60)
Glucose, Bld: 305 mg/dL — ABNORMAL HIGH (ref 70–99)
Potassium: 3.8 mmol/L (ref 3.5–5.1)
Sodium: 135 mmol/L (ref 135–145)
Total Bilirubin: 1 mg/dL (ref 0.3–1.2)
Total Protein: 7.8 g/dL (ref 6.5–8.1)

## 2020-06-07 LAB — SALICYLATE LEVEL: Salicylate Lvl: <7 mg/dL — ABNORMAL LOW (ref 7.0–30.0)

## 2020-06-07 LAB — ETHANOL: Alcohol, Ethyl (B): <10 mg/dL (ref <10)

## 2020-06-07 LAB — ACETAMINOPHEN LEVEL: Acetaminophen (Tylenol), Serum: <10 ug/mL — ABNORMAL LOW (ref 10–30)

## 2020-06-07 MED ORDER — LISINOPRIL 10 MG PO TABS
20.0000 mg | ORAL_TABLET | Freq: Every day | ORAL | Status: DC
  Administered 2020-06-08 – 2020-06-10 (×3): 20 mg via ORAL
  Filled 2020-06-07 (×3): qty 2

## 2020-06-07 MED ORDER — LISINOPRIL-HYDROCHLOROTHIAZIDE 20-25 MG PO TABS: 1.0000 | ORAL_TABLET | Freq: Every day | ORAL | Status: DC

## 2020-06-07 MED ORDER — INSULIN GLARGINE 100 UNIT/ML ~~LOC~~ SOLN
30.0000 [IU] | Freq: Every day | SUBCUTANEOUS | Status: DC
  Filled 2020-06-07: qty 0.3

## 2020-06-07 MED ORDER — ATORVASTATIN CALCIUM 40 MG PO TABS
80.0000 mg | ORAL_TABLET | Freq: Every day | ORAL | Status: DC
  Administered 2020-06-08 – 2020-06-10 (×3): 80 mg via ORAL
  Filled 2020-06-07 (×3): qty 2

## 2020-06-07 MED ORDER — INSULIN GLARGINE 100 UNIT/ML ~~LOC~~ SOLN
30.0000 [IU] | Freq: Every day | SUBCUTANEOUS | Status: DC
  Administered 2020-06-07 – 2020-06-08 (×2): 30 [IU] via SUBCUTANEOUS
  Filled 2020-06-07 (×3): qty 0.3

## 2020-06-07 MED ORDER — HYDROCHLOROTHIAZIDE 25 MG PO TABS
25.0000 mg | ORAL_TABLET | Freq: Every day | ORAL | Status: DC
  Administered 2020-06-08 – 2020-06-10 (×3): 25 mg via ORAL
  Filled 2020-06-07 (×3): qty 1

## 2020-06-07 NOTE — ED Notes (Signed)
Patient transported to X-ray 

## 2020-06-07 NOTE — ED Provider Notes (Signed)
   Patient signed out to me at end of shift my Placido Sou, PA-C pending medical clearance.  Patient here requesting detox from crack cocaine.  Last used 7 to 8 hours prior to arrival.  He also admits to recent stressors and difficulty coping with several recent deaths in his family including parents and his son.  Admits to thoughts of killing himself.  On exam, patient is resting comfortably.  Easily aroused.  He has been pleasant and cooperative.  Labs reassuring, his blood glucose this evening is elevated at 305.  He is due to take his evening dose of Lantus which is 30 units.  His serum creatinine is elevated at 1.34 which appears baseline.  No other electrolyte derangement.  Remaining labs unremarkable.    He has been reasonably screened and medically clear at this time.  Awaiting TTS consult.  Findings discussed with overnight provider, Dr. Bebe Shaggy who agrees to reevaluate and arrange disposition after TTS consult.  Patient remains cooperative and sleeping at this time.  Patient has expressed suicidal thoughts, it is felt that he would need IVC if he threatens to leave.   Pauline Aus, PA-C 06/07/20 2346    Bethann Berkshire, MD 06/09/20 2238

## 2020-06-07 NOTE — ED Notes (Signed)
Pt dressed out, belongings are bagged, labeled and locked up. Cell phone was also locked away. Pt given a meal, drink and a snack.
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
## 2020-06-07 NOTE — ED Triage Notes (Signed)
Pt states he wants detox from smoking crack.  Last smoked this morning.  Pt requesting something to eat and drink once in triage. Pt admits to SI and HI but denies any plan.

## 2020-06-08 LAB — CBG MONITORING, ED
Glucose-Capillary: 189 mg/dL — ABNORMAL HIGH (ref 70–99)
Glucose-Capillary: 242 mg/dL — ABNORMAL HIGH (ref 70–99)
Glucose-Capillary: 278 mg/dL — ABNORMAL HIGH (ref 70–99)
Glucose-Capillary: 299 mg/dL — ABNORMAL HIGH (ref 70–99)
Glucose-Capillary: 313 mg/dL — ABNORMAL HIGH (ref 70–99)
Glucose-Capillary: 333 mg/dL — ABNORMAL HIGH (ref 70–99)

## 2020-06-08 LAB — RESP PANEL BY RT-PCR (FLU A&B, COVID) ARPGX2
Influenza A by PCR: NEGATIVE
Influenza B by PCR: NEGATIVE
SARS Coronavirus 2 by RT PCR: NEGATIVE

## 2020-06-08 LAB — HEMOGLOBIN A1C
Hgb A1c MFr Bld: 10.6 % — ABNORMAL HIGH (ref 4.8–5.6)
Mean Plasma Glucose: 257.52 mg/dL

## 2020-06-08 MED ORDER — LINAGLIPTIN 5 MG PO TABS
5.0000 mg | ORAL_TABLET | Freq: Every day | ORAL | Status: DC
  Administered 2020-06-08 – 2020-06-10 (×3): 5 mg via ORAL
  Filled 2020-06-08 (×3): qty 1

## 2020-06-08 MED ORDER — INSULIN ASPART 100 UNIT/ML ~~LOC~~ SOLN
0.0000 [IU] | Freq: Three times a day (TID) | SUBCUTANEOUS | Status: DC
  Administered 2020-06-08: 8 [IU] via SUBCUTANEOUS
  Administered 2020-06-08: 11 [IU] via SUBCUTANEOUS
  Administered 2020-06-09 (×2): 3 [IU] via SUBCUTANEOUS
  Administered 2020-06-09: 11 [IU] via SUBCUTANEOUS
  Administered 2020-06-10: 3 [IU] via SUBCUTANEOUS
  Filled 2020-06-08 (×6): qty 1

## 2020-06-08 MED ORDER — INSULIN ASPART 100 UNIT/ML ~~LOC~~ SOLN
0.0000 [IU] | Freq: Every day | SUBCUTANEOUS | Status: DC
  Administered 2020-06-08: 3 [IU] via SUBCUTANEOUS
  Administered 2020-06-09: 4 [IU] via SUBCUTANEOUS
  Filled 2020-06-08 (×2): qty 1

## 2020-06-08 NOTE — BH Assessment (Addendum)
Disposition Update:  Pt is a 55 year old male who remains at the [facility].  Upon chart review  ''55 year old male with a history of depression, hyperlipidemia, hypertension, diabetes mellitus, drug use, who presents the emergency department due to SI." Pt was reassessed this AM.  Recommend continued inpatient placement.  Patient referred to [facility] for potential placement at 0319.  Per [name], RN [facility], "We need to see a downward trend with his cbg. Should be under 300 for 24 hours, however it can be case by case."  Patient re-referred to [facility] for potential bed placement at 2200. Per [name], RN [facility], "CBG's aren't showing a downward trend. Advised by [facility] to fax him out to other facilities and that we can still review tomorrow and see if they're doing better.  Disposition Counselor faxed patient to the following other facilities for consideration of bed placement:  [facility] Details  [facility] Details  [facility]  [facility]  [facility] Details  [facility] Details  [facility] Details  [facility] Details  [facility] Details  [facility] Details  [facility] Details  [facility] Details  [facility] Details  [facility] Details  [facility] Details  [facility] Details  [facility] Details  [facility] Details  [facility] Details  [facility] Details  [facility] Details  [facility] Details  [facility] Details  [facility] Details  [facility]  Details  [facility] Details  [facility] Details  [facility] Details  [facility] Details  [facility] Details  [facility] Details  [facility]   Patient's nurse at [facility] Antony Salmon, NP), provided updates regarding patient's disposition as noted above.

## 2020-06-08 NOTE — Progress Notes (Signed)
Inpatient Diabetes Program Recommendations  AACE/ADA: New Consensus Statement on Inpatient Glycemic Control   Target Ranges:  Prepandial:   less than 140 mg/dL      Peak postprandial:   less than 180 mg/dL (1-2 hours)      Critically ill patients:  140 - 180 mg/dL  Results for JERRAN, TAPPAN (MRN [id number]) as of 06/08/2020 08:43  Ref. Range 06/08/2020 00:01 06/08/2020 07:32  Glucose-Capillary Latest Ref Range: 70 - 99 mg/dL 962 (H) 229 (H)    Review of Glycemic Control  Diabetes history: DM2 Outpatient Diabetes medications: Lantus 30 units daily, Januvia 100 mg daily (not taking) Current orders for Inpatient glycemic control: Lantus 30 units daily, Tradjenta 5 mg daily  Inpatient Diabetes Program Recommendations:    Insulin: Please consider ordering CBGs AC&HS with Novolog 0-15 units TID with meals and Novolog 0-5 units QHS.  Diet: Please discontinue Regular diet and order Carb Modified diet.  Thanks, Orlando Penner, RN, MSN, CDE Diabetes Coordinator Inpatient Diabetes Program [phone] (Team Pager from 8am to 5pm)

## 2020-06-08 NOTE — ED Notes (Signed)
Pt on video call with TTS, door is closed.

## 2020-06-08 NOTE — ED Notes (Signed)
Video chat ended and pt door is open. Water given to pt at request.

## 2020-06-08 NOTE — Clinical Social Work Note (Signed)
CSW observed patient's ins and PCP status. CSW inquired if patient is agreeable to [facility] and fiancial counselor referral. Patient reported that he is already established with [facility], but wasn't able to notify them that he will not be able to make his next appointment due to not having a phone in his room. CSW agreeable to notify [facility]. Patient agreeable to [facility] referral. CSW referred patient to financial counselor. TOC to follow.
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
## 2020-06-08 NOTE — BH Assessment (Signed)
Clinician provided pt's nurse, Herbert Seta RN, the following information re: pt's potential placement at [facility]:  "We need to see a downward trend with his cbg. Should be under 300 for 24 hours, however it can be case by case." [name], RN [facility]  A COVID test was also requested.

## 2020-06-08 NOTE — BH Assessment (Signed)
Comprehensive Clinical Assessment (CCA) Note  06/08/2020 Troy Gordon [id number]   Recommendations for Services/Supports/Treatments: Melbourne Abts, PA, reviewed pt's chart and information and determined pt meets inpatient criteria. Pt's referral information will be provided to [facility] [name], RN, for review. If there are no appropriate beds for pt, pt's referral information will be faxed out to multiple hospitals for potential placement. This information was relayed to pt's nurse, Earnie Larsson, at [phone].  The patient demonstrates the following risk factors for suicide: Chronic risk factors for suicide include: psychiatric disorder of Cocaine-induced depressive disorder, substance use disorder, previous suicide attempts several years ago and demographic factors (male, >2 y/o). Acute risk factors for suicide include: family or marital conflict and unemployment. Protective factors for this patient include: coping skills. Considering these factors, the overall suicide risk at this point appears to be moderate. Patient is not appropriate for outpatient follow up.  Therefore, a 2:1 sitter is recommended for suicide precautions. This information was relayed to pt's nurse, Earnie Larsson, at [phone].  Flowsheet Row ED from 06/07/2020 in [facility] EMERGENCY DEPARTMENT  C-SSRS RISK CATEGORY Moderate Risk      Chief Complaint:  Chief Complaint  Patient presents with  . V70.1   Visit Diagnosis: F14.24, Cocaine-induced depressive disorder, With moderate or severe use disorder  CCA Screening, Triage and Referral (STR) Chrystian Gordon is a 55 year old patient who came to the [facility] due to experiencing SI with a plan and due to a desire to get help getting off of drugs.    Patient Reported Information How did you hear about [place]? Other (Comment) (EDP)  Referral name: EDP  Referral phone number: [phone] (Unknown)   Whom do you see for routine medical problems? Primary Care  Practice/Facility Name: Primary  Care  Practice/Facility Phone Number: [phone] (Unknown)  Name of Contact: [name], [facility]  Contact Number: Unknown  Contact Fax Number: Unknown  Prescriber Name: [name], [facility]  Prescriber Address (if known): Unknown   What Is the Reason for Your Visit/Call Today? Pt states, "I'm on drugs and I need help." Pt confirms he's been experiencing SI with a plan to cut his wrist.  How Long Has This Been Causing You Problems? <Week  What Do You Feel Would Help You the Most Today? Alcohol or Drug Use Treatment   Have You Recently Been in Any Inpatient Treatment (Hospital/Detox/Crisis Center/28-Day Program)? No  Name/Location of Program/Hospital:No data recorded How Long Were You There? No data recorded When Were You Discharged? No data recorded  Have You Ever Received Services From [facility] Before? Yes  Who Do You See at [facility]? [name], PAC   Have You Recently Had Any Thoughts About Hurting Yourself? Yes  Are You Planning to Commit Suicide/Harm Yourself At This time? Yes   Have you Recently Had Thoughts About Hurting Someone [name]? Yes  Explanation: Pt wants to kill a person he knows "on the street" as Tae.   Have You Used Any Alcohol or Drugs in the Past 24 Hours? Yes  How Long Ago Did You Use Drugs or Alcohol? No data recorded What Did You Use and How Much? Crack cocaine; 8 hrs prior to admit   Do You Currently Have a Therapist/Psychiatrist? No  Name of Therapist/Psychiatrist: No data recorded  Have You Been Recently Discharged From Any Office Practice or Programs? No  Explanation of Discharge From Practice/Program: No data recorded    CCA Screening Triage Referral Assessment Type of Contact: Tele-Assessment  Is this Initial or  Reassessment? Initial Assessment  Date Telepsych consult ordered in [facility]:  06/07/2020  Time Telepsych consult ordered in [facility]:  2026   Patient Reported Information Reviewed? Yes  Patient Left Without Being Seen?  No data recorded Reason for Not Completing Assessment: No data recorded  Collateral Involvement: Pt denied having anyone clinician could make contact for collateral information   Does Patient Have a Court Appointed Legal Guardian? No data recorded Name and Contact of Legal Guardian: No data recorded If Minor and Not Living with Parent(s), Who has Custody? N/A  Is CPS involved or ever been involved? Never  Is APS involved or ever been involved? Never   Patient Determined To Be At Risk for Harm To Self or Others Based on Review of Patient Reported Information or Presenting Complaint? Yes, for Self-Harm  Method: No data recorded Availability of Means: No data recorded Intent: No data recorded Notification Required: No data recorded Additional Information for Danger to Others Potential: No data recorded Additional Comments for Danger to Others Potential: No data recorded Are There Guns or Other Weapons in Your Home? No data recorded Types of Guns/Weapons: No data recorded Are These Weapons Safely Secured?                            No data recorded Who Could Verify You Are Able To Have These Secured: No data recorded Do You Have any Outstanding Charges, Pending Court Dates, Parole/Probation? No data recorded Contacted To Inform of Risk of Harm To Self or Others: Other: Comment (EDP)   Location of Assessment: [facility]   Does Patient Present under Involuntary Commitment? No  IVC Papers Initial File Date: No data recorded  [place] of Residence: [place]   Patient Currently Receiving the Following Services: Not Receiving Services   Determination of Need: Urgent (48 hours)   Options For Referral: Outpatient Therapy; Medication Management     CCA Biopsychosocial Intake/Chief Complaint:  Pt states, "I'm on drugs and I need help." Pt confirms he's been experiencing SI with a plan to cut his wrist.  Current Symptoms/Problems: Not assessed   Patient Reported  Schizophrenia/Schizoaffective Diagnosis in Past: No   Strengths: Not assessed  Preferences: Not assessed  Abilities: Not assessed   Type of Services Patient Feels are Needed: Pt would like to go to drug rehab   Initial Clinical Notes/Concerns: N/A   Mental Health Symptoms Depression:  Fatigue; Worthlessness   Duration of Depressive symptoms: Greater than two weeks   Mania:  None   Anxiety:   Worrying   Psychosis:  None   Duration of Psychotic symptoms: No data recorded  Trauma:  None   Obsessions:  None   Compulsions:  None   Inattention:  None   Hyperactivity/Impulsivity:  N/A   Oppositional/Defiant Behaviors:  None   Emotional Irregularity:  Mood lability; Intense/inappropriate anger; Potentially harmful impulsivity; Recurrent suicidal behaviors/gestures/threats   Other Mood/Personality Symptoms:  None noted    Mental Status Exam Appearance and self-care  Stature:  Average   Weight:  Average weight   Clothing:  -- (Pt is dressed out in scrubs)   Grooming:  -- (UTA)   Cosmetic use:  None   Posture/gait:  Normal   Motor activity:  Not Remarkable   Sensorium  Attention:  Normal   Concentration:  Normal   Orientation:  X5   Recall/memory:  Normal   Affect and Mood  Affect:  Appropriate   Mood:  Depressed  Relating  Eye contact:  Normal   Facial expression:  Responsive   Attitude toward examiner:  Cooperative   Thought and Language  Speech flow: Clear and Coherent   Thought content:  Appropriate to Mood and Circumstances   Preoccupation:  None   Hallucinations:  Auditory (Pt shares he experiences AH when he's coming down off of the use of drugs)   Organization:  No data recorded  Affiliated Computer ServicesExecutive Functions  Fund of Knowledge:  Average   Intelligence:  Average   Abstraction:  Normal   Judgement:  Fair   Dance movement psychotherapisteality Testing:  Adequate   Insight:  Fair   Decision Making:  Impulsive   Social Functioning  Social Maturity:   Impulsive   Social Judgement:  Heedless   Stress  Stressors:  Family conflict; Housing   Coping Ability:  Deficient supports   Skill Deficits:  Scientist, physiologicalDecision making; Self-care; Self-control   Supports:  Support needed     Religion: Religion/Spirituality Are You A Religious Person?:  (Not assessed) How Might This Affect Treatment?: Not assessed  Leisure/Recreation: Leisure / Recreation Do You Have Hobbies?:  (Not assessed)  Exercise/Diet: Exercise/Diet Do You Exercise?:  (Not assessed) Have You Gained or Lost A Significant Amount of Weight in the Past Six Months?:  (Not assessed) Do You Follow a Special Diet?:  (Not assessed) Do You Have Any Trouble Sleeping?:  (Not assessed)   CCA Employment/Education Employment/Work Situation: Employment / Work Situation Employment situation: Unemployed Patient's job has been impacted by current illness:  (N/A) What is the longest time patient has a held a job?: Not assessed Where was the patient employed at that time?: Not assessed Has patient ever been in the [employer] and Companymilitary?:  (Not assessed)  Education: Education Is Patient Currently Attending School?: No Last Grade Completed:  (Not assessed) Name of High School: Not assessed Did Garment/textile technologistYou Graduate From [employer] School?:  (Not assessed) Did Theme park managerYou Attend College?:  (Not assessed) Did You Attend Graduate School?:  (Not assessed) Did You Have Any Special Interests In School?: Not assessed Did You Have An Individualized Education Program (IIEP):  (Not assessed) Did You Have Any Difficulty At School?:  (Not assessed) Patient's Education Has Been Impacted by Current Illness:  (Not assessed)   CCA Family/Childhood History Family and Relationship History: Family history Marital status:  (Not assessed) Are you sexually active?:  (Not assessed) What is your sexual orientation?: Not assessed Has your sexual activity been affected by drugs, alcohol, medication, or emotional stress?: Not assessed Does  patient have children?:  (Not assessed)  Childhood History:  Childhood History By whom was/is the patient raised?: Grandparents Additional childhood history information: Pt was raised by his maternal grandparents; he shares his mother was a teenager when she had him and didn't know how/couldn't raise him. Description of patient's relationship with caregiver when they were a child: Not assessed Patient's description of current relationship with people who raised him/her: Not assessed How were you disciplined when you got in trouble as a child/adolescent?: Not assessed Does patient have siblings?: Yes Number of Siblings: 8 Description of patient's current relationship with siblings: Pt states he has no contact with his siblings Did patient suffer any verbal/emotional/physical/sexual abuse as a child?: No Did patient suffer from severe childhood neglect?: No Has patient ever been sexually abused/assaulted/raped as an adolescent or adult?: No Was the patient ever a victim of a crime or a disaster?: No Witnessed domestic violence?: No Has patient been affected by domestic violence as an adult?: No  Child/Adolescent Assessment:  CCA Substance Use Alcohol/Drug Use: Alcohol / Drug Use Pain Medications: Please see MAR Prescriptions: Please see MAR Over the Counter: Please see MAR History of alcohol / drug use?: Yes Longest period of sobriety (when/how long): Not assessed Negative Consequences of Use: Personal relationships Withdrawal Symptoms:  (Not assessed) Substance #1 Name of Substance 1: Crack cocaine 1 - Age of First Use: 54 1 - Amount (size/oz): An 8-Ball every 2 - 3 days 1 - Frequency: Daily 1 - Duration: Unknown 1 - Last Use / Amount: 06/07/2020 1 - Method of Aquiring: Purchase 1- Route of Use: Smoke Substance #2 Name of Substance 2: Marijuana 2 - Age of First Use: Teenager 2 - Amount (size/oz): 1 blunt 2 - Frequency: 2x/week 2 - Duration: Unknown 2 - Last Use /  Amount: Last week 2 - Method of Aquiring: Purchase 2 - Route of Substance Use: Smoke                     ASAM's:  Six Dimensions of Multidimensional Assessment  Dimension 1:  Acute Intoxication and/or Withdrawal Potential:   Dimension 1:  Description of individual's past and current experiences of substance use and withdrawal: Pt has stopped using in the past for short periods of time.  Dimension 2:  Biomedical Conditions and Complications:   Dimension 2:  Description of patient's biomedical conditions and  complications: Pt has diabetes  Dimension 3:  Emotional, Behavioral, or Cognitive Conditions and Complications:  Dimension 3:  Description of emotional, behavioral, or cognitive conditions and complications: Pt has experienced some SI with a plan  Dimension 4:  Readiness to Change:  Dimension 4:  Description of Readiness to Change criteria: Pt is requesting detox and rehab  Dimension 5:  Relapse, Continued use, or Continued Problem Potential:  Dimension 5:  Relapse, continued use, or continued problem potential critiera description: Pt has attempted to stop using in the past  Dimension 6:  Recovery/Living Environment:  Dimension 6:  Recovery/Iiving environment criteria description: Pt has few/no supports  ASAM Severity Score: ASAM's Severity Rating Score: 9  ASAM Recommended Level of Treatment: ASAM Recommended Level of Treatment: Level II Partial Hospitalization Treatment   Substance use Disorder (SUD) Substance Use Disorder (SUD)  Checklist Symptoms of Substance Use: Evidence of tolerance,Persistent desire or unsuccessful efforts to cut down or control use,Substance(s) often taken in larger amounts or over longer times than was intended  Recommendations for Services/Supports/Treatments: Recommendations for Services/Supports/Treatments Recommendations For Services/Supports/Treatments: Inpatient Hospitalization  Melbourne Abts, PA, reviewed pt's chart and information and determined  pt meets inpatient criteria. Pt's referral information will be provided to [facility] [name], RN, for review. If there are no appropriate beds for pt, pt's referral information will be faxed out to multiple hospitals for potential placement. This information was relayed to pt's nurse, Earnie Larsson, at [phone].  DSM5 Diagnoses: Patient Active Problem List   Diagnosis Date Noted  . Depression, major, single episode, moderate (HCC) 05/22/2019  . Hyperlipidemia associated with type 2 diabetes mellitus (HCC) 06/07/2017  . Benign essential HTN 11/20/2016  . Type 2 diabetes mellitus with hyperglycemia, with long-term current use of insulin (HCC) 11/20/2016    Patient Centered Plan: Patient is on the following Treatment Plan(s):  Depression, Impulse Control and Substance Abuse   Referrals to Alternative Service(s): Referred to Alternative Service(s):   Place:   Date:   Time:    Referred to Alternative Service(s):   Place:   Date:   Time:    Referred  to Alternative Service(s):   Place:   Date:   Time:    Referred to Alternative Service(s):   Place:   Date:   Time:     Ralph Dowdy, LMFT

## 2020-06-09 LAB — CBG MONITORING, ED
Glucose-Capillary: 165 mg/dL — ABNORMAL HIGH (ref 70–99)
Glucose-Capillary: 343 mg/dL — ABNORMAL HIGH (ref 70–99)
Glucose-Capillary: 191 mg/dL — ABNORMAL HIGH (ref 70–99)
Glucose-Capillary: 316 mg/dL — ABNORMAL HIGH (ref 70–99)

## 2020-06-09 MED ORDER — INSULIN ASPART 100 UNIT/ML ~~LOC~~ SOLN
3.0000 [IU] | Freq: Three times a day (TID) | SUBCUTANEOUS | Status: DC
  Administered 2020-06-09 – 2020-06-10 (×4): 3 [IU] via SUBCUTANEOUS
  Filled 2020-06-09 (×3): qty 1

## 2020-06-09 MED ORDER — INSULIN GLARGINE 100 UNIT/ML ~~LOC~~ SOLN
35.0000 [IU] | Freq: Every day | SUBCUTANEOUS | Status: DC
  Administered 2020-06-09: 35 [IU] via SUBCUTANEOUS
  Filled 2020-06-09 (×4): qty 0.35

## 2020-06-09 NOTE — Progress Notes (Addendum)
[facility] was [name]    Meets inpatient criteria per Melbourne Abts, PA .  Attending physician is [name], MD.  Patient is voluntary.    Patient can be transported by Patent examiner.  Nurses call report to [phone].  Notified Maurine Cane, RN of acceptance.  Patient can arrive after 8PM, 06/09/2020 pending blood sugar remaining below 250 at lunch and dinner.   Penni Homans, MSW, LCSW 06/09/2020 11:09 AM

## 2020-06-09 NOTE — Progress Notes (Signed)
Inpatient Diabetes Program Recommendations  AACE/ADA: New Consensus Statement on Inpatient Glycemic Control   Target Ranges:  Prepandial:   less than 140 mg/dL      Peak postprandial:   less than 180 mg/dL (1-2 hours)      Critically ill patients:  140 - 180 mg/dL  Results for FRANS, VALENTE (MRN [id number]) as of 06/09/2020 07:08  Ref. Range 06/08/2020 07:32 06/08/2020 11:20 06/08/2020 13:57 06/08/2020 17:36 06/08/2020 21:18  Glucose-Capillary Latest Ref Range: 70 - 99 mg/dL 409 (H) 811 (H) 914 (H) 278 (H) 299 (H)    Review of Glycemic Control Diabetes history: DM2 Outpatient Diabetes medications: Lantus 30 units daily, Januvia 100 mg daily (not taking) Current orders for Inpatient glycemic control: Lantus 30 units daily, Novolog 0-15 units TID with meals, Novolog 0-5 units QHS, Tradjenta 5 mg daily  Inpatient Diabetes Program Recommendations:    Insulin: Please consider increasing Lantus to 35 units and ordering Novolog 3 units TID with meals for meal coverage if patient eats at least 50% of meals.  Thanks, Orlando Penner, RN, MSN, CDE Diabetes Coordinator Inpatient Diabetes Program [phone] (Team Pager from 8am to 5pm)

## 2020-06-09 NOTE — ED Provider Notes (Signed)
Emergency Medicine Observation Re-evaluation Note  Troy Gordon is a 55 y.o. male, seen on rounds today.  Pt initially presented to the ED for complaints of V70.1 Currently, the patient is awaiting inpatient placement.  Physical Exam  BP (!) 142/86   Pulse 68   Temp 98.2 F (36.8 C) (Oral)   Resp 18   Ht 5\' 8"  (1.727 m)   Wt 85.3 kg   SpO2 98%   BMI 28.59 kg/m  Physical Exam General: Calm  Cardiac: Well perfused.  Lungs: Even, unlabored respirations.  Psych: Cooperative.   ED Course / MDM  EKG:EKG Interpretation  Date/Time:  Tuesday June 07 2020 20:50:27 EDT Ventricular Rate:  82 PR Interval:  150 QRS Duration: 74 QT Interval:  396 QTC Calculation: 462 R Axis:   36 Text Interpretation: Normal sinus rhythm Cannot rule out Anterior infarct , age undetermined ST & T wave abnormality, consider inferolateral ischemia Abnormal ECG Confirmed by [id number] (Zadie Rhine) on 06/08/2020 1:13:48 AM   I have reviewed the labs performed to date as well as medications administered while in observation.  Recent changes in the last 24 hours include DM consult.   Have made recommended changes to Lantus and mealtime Novolog.   Patient accepted to 06/10/2020. Accepting MD is [name], [facility]. Plan for transfer. EMTALA completed.   Plan  Current plan is for inpatient psych placement. [name], MD 06/09/20 [phone]

## 2020-06-09 NOTE — ED Notes (Signed)
Attempted to call report to nurse at [facility]. She states he cannot go to her facility yet because he has not had 2 blood sugars consecutively under 250. [facility] called and updated.

## 2020-06-10 LAB — CBG MONITORING, ED
Glucose-Capillary: 104 mg/dL — ABNORMAL HIGH (ref 70–99)
Glucose-Capillary: 160 mg/dL — ABNORMAL HIGH (ref 70–99)

## 2020-06-10 NOTE — ED Notes (Signed)
Facility called to say that they saw the 160 blood sugar and they have accepted pt, states that I can call report to number [phone]

## 2020-06-10 NOTE — ED Notes (Signed)
Pt in bed, pt denies pain, pt reports that he is still si, pt has tele sitter in room, pt calm and cooperative.

## 2020-06-10 NOTE — ED Provider Notes (Signed)
Pt accepted [name] by Dr. Sherryle Lis.  Pt is stable for transfer.    Jacalyn Lefevre, MD 06/10/20 1431

## 2020-06-10 NOTE — ED Notes (Signed)
Called facility to update that transport is here,

## 2020-06-10 NOTE — ED Notes (Signed)
Report called to RN Cristino Martes at [facility], per [place] accepting md is Joycelyn Rua, phone number is [phone]

## 2020-06-10 NOTE — ED Notes (Signed)
Meal tray given 

## 2020-06-10 NOTE — ED Notes (Signed)
Belongings given to safe transport with paperwork, pt states that the one bag was all his belongings, pt states that he is ready to transport,

## 2020-06-30 ENCOUNTER — Telehealth: Payer: Self-pay

## 2020-06-30 NOTE — Telephone Encounter (Signed)
Attempted to call client to follow up regarding needing assistance with medications and also connecting for MH services follow up.  No answer, left voicemail.   Francee Nodal RN [employer]

## 2020-10-24 ENCOUNTER — Encounter (HOSPITAL_COMMUNITY): Payer: Self-pay

## 2020-10-24 ENCOUNTER — Other Ambulatory Visit: Payer: Self-pay

## 2020-10-24 ENCOUNTER — Emergency Department (HOSPITAL_COMMUNITY)
Admission: EM | Admit: 2020-10-24 | Discharge: 2020-10-25 | Disposition: A | Discharge status: Other Acute Inpatient Hospital | Payer: Medicaid Other | Attending: Emergency Medicine | Admitting: Emergency Medicine

## 2020-10-24 DIAGNOSIS — Z87891 Personal history of nicotine dependence: Secondary | ICD-10-CM | POA: Insufficient documentation

## 2020-10-24 DIAGNOSIS — F1910 Other psychoactive substance abuse, uncomplicated: Secondary | ICD-10-CM | POA: Insufficient documentation

## 2020-10-24 DIAGNOSIS — Y900 Blood alcohol level of less than 20 mg/100 ml: Secondary | ICD-10-CM | POA: Insufficient documentation

## 2020-10-24 DIAGNOSIS — Z20822 Contact with and (suspected) exposure to covid-19: Secondary | ICD-10-CM | POA: Insufficient documentation

## 2020-10-24 DIAGNOSIS — I10 Essential (primary) hypertension: Secondary | ICD-10-CM | POA: Insufficient documentation

## 2020-10-24 DIAGNOSIS — R45851 Suicidal ideations: Secondary | ICD-10-CM | POA: Insufficient documentation

## 2020-10-24 DIAGNOSIS — E119 Type 2 diabetes mellitus without complications: Secondary | ICD-10-CM | POA: Insufficient documentation

## 2020-10-24 LAB — COMPREHENSIVE METABOLIC PANEL
ALT: 19 U/L (ref 0–44)
AST: 20 U/L (ref 15–41)
Albumin: 3.8 g/dL (ref 3.5–5.0)
Alkaline Phosphatase: 86 U/L (ref 38–126)
Anion gap: 9 (ref 5–15)
BUN: 19 mg/dL (ref 6–20)
CO2: 25 mmol/L (ref 22–32)
Calcium: 9.1 mg/dL (ref 8.9–10.3)
Chloride: 100 mmol/L (ref 98–111)
Creatinine, Ser: 1.3 mg/dL — ABNORMAL HIGH (ref 0.61–1.24)
GFR, Estimated: >60 mL/min (ref >60)
Glucose, Bld: 439 mg/dL — ABNORMAL HIGH (ref 70–99)
Potassium: 4.4 mmol/L (ref 3.5–5.1)
Sodium: 134 mmol/L — ABNORMAL LOW (ref 135–145)
Total Bilirubin: 0.9 mg/dL (ref 0.3–1.2)
Total Protein: 7.2 g/dL (ref 6.5–8.1)

## 2020-10-24 LAB — CBC
HCT: 46.2 % (ref 39.0–52.0)
Hemoglobin: 15.2 g/dL (ref 13.0–17.0)
MCH: 27.9 pg (ref 26.0–34.0)
MCHC: 32.9 g/dL (ref 30.0–36.0)
MCV: 84.8 fL (ref 80.0–100.0)
Platelets: 109 10*3/uL — ABNORMAL LOW (ref 150–400)
RBC: 5.45 MIL/uL (ref 4.22–5.81)
RDW: 13.1 % (ref 11.5–15.5)
WBC: 6.7 10*3/uL (ref 4.0–10.5)
nRBC: 0 % (ref 0.0–0.2)

## 2020-10-24 LAB — HEMOGLOBIN A1C
Hgb A1c MFr Bld: 11.3 % — ABNORMAL HIGH (ref 4.8–5.6)
Mean Plasma Glucose: 277.61 mg/dL

## 2020-10-24 LAB — RAPID URINE DRUG SCREEN, HOSP PERFORMED
Amphetamines: NOT DETECTED
Barbiturates: NOT DETECTED
Benzodiazepines: NOT DETECTED
Cocaine: POSITIVE — AB
Opiates: NOT DETECTED
Tetrahydrocannabinol: NOT DETECTED

## 2020-10-24 LAB — ACETAMINOPHEN LEVEL: Acetaminophen (Tylenol), Serum: <10 ug/mL — ABNORMAL LOW (ref 10–30)

## 2020-10-24 LAB — SALICYLATE LEVEL: Salicylate Lvl: <7 mg/dL — ABNORMAL LOW (ref 7.0–30.0)

## 2020-10-24 LAB — CBG MONITORING, ED: Glucose-Capillary: 414 mg/dL — ABNORMAL HIGH (ref 70–99)

## 2020-10-24 LAB — ETHANOL: Alcohol, Ethyl (B): <10 mg/dL (ref <10)

## 2020-10-24 MED ORDER — INSULIN GLARGINE-YFGN 100 UNIT/ML ~~LOC~~ SOLN
30.0000 [IU] | Freq: Every day | SUBCUTANEOUS | Status: DC
  Administered 2020-10-24 – 2020-10-25 (×2): 30 [IU] via SUBCUTANEOUS
  Filled 2020-10-24 (×3): qty 0.3

## 2020-10-24 MED ORDER — ONDANSETRON HCL 4 MG PO TABS: 4.0000 mg | ORAL_TABLET | Freq: Three times a day (TID) | ORAL | Status: DC | PRN

## 2020-10-24 MED ORDER — ACETAMINOPHEN 325 MG PO TABS
650.0000 mg | ORAL_TABLET | ORAL | Status: DC | PRN
Start: 2020-10-24 — End: 2020-10-25

## 2020-10-24 MED ORDER — ALUM & MAG HYDROXIDE-SIMETH 200-200-20 MG/5ML PO SUSP: 30.0000 mL | Freq: Four times a day (QID) | ORAL | Status: DC | PRN

## 2020-10-24 MED ORDER — LISINOPRIL 10 MG PO TABS
20.0000 mg | ORAL_TABLET | Freq: Every day | ORAL | Status: DC
  Administered 2020-10-24 – 2020-10-25 (×2): 20 mg via ORAL
  Filled 2020-10-24 (×2): qty 2

## 2020-10-24 MED ORDER — THIAMINE HCL 100 MG PO TABS
100.0000 mg | ORAL_TABLET | Freq: Every day | ORAL | Status: DC
  Administered 2020-10-24 – 2020-10-25 (×2): 100 mg via ORAL
  Filled 2020-10-24 (×2): qty 1

## 2020-10-24 MED ORDER — LISINOPRIL-HYDROCHLOROTHIAZIDE 20-25 MG PO TABS: 1.0000 | ORAL_TABLET | Freq: Every day | ORAL | Status: DC

## 2020-10-24 MED ORDER — LORAZEPAM 1 MG PO TABS: 0.0000 mg | ORAL_TABLET | Freq: Four times a day (QID) | ORAL | Status: DC

## 2020-10-24 MED ORDER — LORAZEPAM 2 MG/ML IJ SOLN: 0.0000 mg | Freq: Four times a day (QID) | INTRAMUSCULAR | Status: DC

## 2020-10-24 MED ORDER — INSULIN GLARGINE 100 UNIT/ML SOLOSTAR PEN: 30.0000 [IU] | PEN_INJECTOR | Freq: Every day | SUBCUTANEOUS | Status: DC

## 2020-10-24 MED ORDER — LORAZEPAM 1 MG PO TABS: 0.0000 mg | ORAL_TABLET | Freq: Two times a day (BID) | ORAL | Status: DC

## 2020-10-24 MED ORDER — INSULIN ASPART 100 UNIT/ML IJ SOLN
0.0000 [IU] | Freq: Three times a day (TID) | INTRAMUSCULAR | Status: DC
  Administered 2020-10-24: 15 [IU] via SUBCUTANEOUS
  Administered 2020-10-25: 3 [IU] via SUBCUTANEOUS
  Administered 2020-10-25: 8 [IU] via SUBCUTANEOUS
  Filled 2020-10-24 (×2): qty 1

## 2020-10-24 MED ORDER — LORAZEPAM 2 MG/ML IJ SOLN: 0.0000 mg | Freq: Two times a day (BID) | INTRAMUSCULAR | Status: DC

## 2020-10-24 MED ORDER — HYDROCHLOROTHIAZIDE 25 MG PO TABS
25.0000 mg | ORAL_TABLET | Freq: Every day | ORAL | Status: DC
  Administered 2020-10-24 – 2020-10-25 (×2): 25 mg via ORAL
  Filled 2020-10-24 (×2): qty 1

## 2020-10-24 MED ORDER — THIAMINE HCL 100 MG/ML IJ SOLN: 100.0000 mg | Freq: Every day | INTRAMUSCULAR | Status: DC

## 2020-10-24 NOTE — ED Notes (Signed)
Pt has sitter at bedside 

## 2020-10-24 NOTE — ED Notes (Signed)
Patient wanded by security. 

## 2020-10-24 NOTE — ED Notes (Signed)
MD notified of CBG 414. States to [employer]. 15 units insulin at this time.

## 2020-10-24 NOTE — ED Triage Notes (Addendum)
Pt. States they are having SI/HI thoughts and thoughts of harming others. Pt. States they attempted to OD on cocaine last night. Pt. States they have been trying to OD since last Thursday.

## 2020-10-24 NOTE — ED Provider Notes (Signed)
Emergency Department Provider Note   I have reviewed the triage vital signs and the nursing notes.   HISTORY  Chief Complaint Medical Clearance   HPI Troy Gordon is a 55 y.o. male with PMH of DM, HLD, HTN, polysubstance abuse, and depression presents to the ED with SI and HI thoughts. Patient reports that since being seen here in the past he got inpatient treatment and started back on his medications. He tells me that he could not get refills on his meds from the PCP or other provider and so went off of meds. He has been off all psych medications for the last 3 months and in that time his depression and SI has worsened. He drinks EtOH and uses cocaine primarily. He attempted to intentionally OD on cocaine the last several nights. Denies any CP or SOB. No medical complaints.    Past Medical History:  Diagnosis Date   Depression    Diabetes mellitus (HCC)    Dyslipidemia    Hyperlipidemia    Hypertension     Patient Active Problem List   Diagnosis Date Noted   Depression, major, single episode, moderate (HCC) 05/22/2019   Hyperlipidemia associated with type 2 diabetes mellitus (HCC) 06/07/2017   Benign essential HTN 11/20/2016   Type 2 diabetes mellitus with hyperglycemia, with Thersia Petraglia-term current use of insulin (HCC) 11/20/2016    Past Surgical History:  Procedure Laterality Date   CIRCUMCISION     as an adult    Allergies Patient has no known allergies.  Family History  Problem Relation Age of Onset   Diabetes Maternal Grandmother    Hypertension Maternal Grandmother    Hypertension Father     Social History Social History   Tobacco Use   Smoking status: Former    Years: 1.00    Types: Cigarettes   Smokeless tobacco: Never   Tobacco comments:    1 cig every 5 weeks  Vaping Use   Vaping Use: Never used  Substance Use Topics   Alcohol use: Yes    Comment: occasionally beer   Drug use: Yes    Types: Marijuana, Cocaine    Comment: last used crack  06/07/20    Review of Systems  Constitutional: No fever/chills Eyes: No visual changes. ENT: No sore throat. Cardiovascular: Denies chest pain. Respiratory: Denies shortness of breath. Gastrointestinal: No abdominal pain.  No nausea, no vomiting.  No diarrhea.  No constipation. Genitourinary: Negative for dysuria. Musculoskeletal: Negative for back pain. Skin: Negative for rash. Neurological: Negative for headaches, focal weakness or numbness. Psychiatric: Positive SI/HI.   10-point ROS otherwise negative.  ____________________________________________   PHYSICAL EXAM:  VITAL SIGNS: ED Triage Vitals  Enc Vitals Group     BP 10/24/20 1500 (!) 197/105     Pulse Rate 10/24/20 1500 83     Resp 10/24/20 1503 18     Temp 10/24/20 1500 (!) 97.5 F (36.4 C)     Temp Source 10/24/20 1500 Oral     SpO2 10/24/20 1500 99 %     Weight 10/24/20 1506 185 lb (83.9 kg)     Height 10/24/20 1506 5\' 8"  (1.727 m)    Constitutional: Alert and oriented. Well appearing and in no acute distress. Eyes: Conjunctivae are normal.  Head: Atraumatic. Nose: No congestion/rhinnorhea. Mouth/Throat: Mucous membranes are moist.   Neck: No stridor.   Cardiovascular: Normal rate, regular rhythm. Good peripheral circulation. Grossly normal heart sounds.   Respiratory: Normal respiratory effort.  No retractions. Lungs CTAB. Gastrointestinal:  Soft and nontender. No distention.  Musculoskeletal: No lower extremity tenderness nor edema. No gross deformities of extremities. Neurologic:  Normal speech and language. No gross focal neurologic deficits are appreciated.  Skin:  Skin is warm, dry and intact. No rash noted. Psychiatric: Mood and affect are normal. Speech and behavior are normal.  ____________________________________________   LABS (all labs ordered are listed, but only abnormal results are displayed)  Labs Reviewed  COMPREHENSIVE METABOLIC PANEL - Abnormal; Notable for the following components:       Result Value   Sodium 134 (*)    Glucose, Bld 439 (*)    Creatinine, Ser 1.30 (*)    All other components within normal limits  SALICYLATE LEVEL - Abnormal; Notable for the following components:   Salicylate Lvl <7.0 (*)    All other components within normal limits  ACETAMINOPHEN LEVEL - Abnormal; Notable for the following components:   Acetaminophen (Tylenol), Serum <10 (*)    All other components within normal limits  CBC - Abnormal; Notable for the following components:   Platelets 109 (*)    All other components within normal limits  RAPID URINE DRUG SCREEN, HOSP PERFORMED - Abnormal; Notable for the following components:   Cocaine POSITIVE (*)    All other components within normal limits  RESP PANEL BY RT-PCR (FLU A&B, COVID) ARPGX2  ETHANOL  HEMOGLOBIN A1C   ____________________________________________  EKG  Rate: 65 PR: 148 QTc: 445  Sinus rhythm. T wave inversions inferior and lateral similar to 05/2020 tracing. No ST elevation or depression.  ____________________________________________  RADIOLOGY  None  ____________________________________________   PROCEDURES  Procedure(s) performed:   Procedures  None  ____________________________________________   INITIAL IMPRESSION / ASSESSMENT AND PLAN / ED COURSE  Pertinent labs & imaging results that were available during my care of the patient were reviewed by me and considered in my medical decision making (see chart for details).   Patient presents to the ED with SI/HI in the setting of med non-compliance and polysubstance abuse. No medical complaints. Patient is tachy on arrival. Avoid beta blockade with cocaine use history. Will med clear and have TTS evaluate.   04:51 PM  Labs reviewed. HR improved w/o intervention. BP elevated along with CBG. Will re-start home BP and DM medications. Have added SSI coverage here. No DKA. Patient is medically clear for TTS evaluation. Will start on CIWA protocol as  well with ETOH < 10 and EtOH abuse history.  ____________________________________________  FINAL CLINICAL IMPRESSION(S) / ED DIAGNOSES  Final diagnoses:  Suicidal ideation  Polysubstance abuse (HCC)     MEDICATIONS GIVEN DURING THIS VISIT:  Medications  acetaminophen (TYLENOL) tablet 650 mg (has no administration in time range)  ondansetron (ZOFRAN) tablet 4 mg (has no administration in time range)  alum & mag hydroxide-simeth (MAALOX/MYLANTA) 200-200-20 MG/5ML suspension 30 mL (has no administration in time range)  lisinopril-hydrochlorothiazide (ZESTORETIC) 20-25 MG per tablet 1 tablet (has no administration in time range)  insulin glargine (LANTUS) Solostar Pen 30 Units (has no administration in time range)  insulin aspart (novoLOG) injection 0-15 Units (has no administration in time range)  LORazepam (ATIVAN) injection 0-4 mg (has no administration in time range)    Or  LORazepam (ATIVAN) tablet 0-4 mg (has no administration in time range)  LORazepam (ATIVAN) injection 0-4 mg (has no administration in time range)    Or  LORazepam (ATIVAN) tablet 0-4 mg (has no administration in time range)  thiamine tablet 100 mg (has no administration in time  range)    Or  thiamine (B-1) injection 100 mg (has no administration in time range)    Note:  This document was prepared using Dragon voice recognition software and may include unintentional dictation errors.  Alona Bene, MD, [facility]    Justun Anaya, Arlyss Repress, MD 10/24/20 [phone]

## 2020-10-25 ENCOUNTER — Encounter (HOSPITAL_COMMUNITY): Payer: Self-pay | Admitting: Emergency Medicine

## 2020-10-25 ENCOUNTER — Inpatient Hospital Stay (HOSPITAL_COMMUNITY)
Admission: AD | Admit: 2020-10-25 | Discharge: 2020-10-28 | DRG: 885 | Disposition: A | Discharge status: Home / Self Care | Payer: Federal, State, Local not specified - Other | Source: Intra-hospital | Attending: Emergency Medicine | Admitting: Emergency Medicine

## 2020-10-25 DIAGNOSIS — Z833 Family history of diabetes mellitus: Secondary | ICD-10-CM | POA: Diagnosis not present

## 2020-10-25 DIAGNOSIS — E1140 Type 2 diabetes mellitus with diabetic neuropathy, unspecified: Secondary | ICD-10-CM | POA: Diagnosis present

## 2020-10-25 DIAGNOSIS — F331 Major depressive disorder, recurrent, moderate: Principal | ICD-10-CM | POA: Diagnosis present

## 2020-10-25 DIAGNOSIS — E7800 Pure hypercholesterolemia, unspecified: Secondary | ICD-10-CM | POA: Diagnosis present

## 2020-10-25 DIAGNOSIS — K5900 Constipation, unspecified: Secondary | ICD-10-CM | POA: Diagnosis present

## 2020-10-25 DIAGNOSIS — F332 Major depressive disorder, recurrent severe without psychotic features: Secondary | ICD-10-CM | POA: Diagnosis not present

## 2020-10-25 DIAGNOSIS — R45851 Suicidal ideations: Secondary | ICD-10-CM | POA: Diagnosis present

## 2020-10-25 DIAGNOSIS — F329 Major depressive disorder, single episode, unspecified: Secondary | ICD-10-CM | POA: Insufficient documentation

## 2020-10-25 DIAGNOSIS — G4700 Insomnia, unspecified: Secondary | ICD-10-CM | POA: Diagnosis present

## 2020-10-25 DIAGNOSIS — F1210 Cannabis abuse, uncomplicated: Secondary | ICD-10-CM | POA: Diagnosis present

## 2020-10-25 DIAGNOSIS — I10 Essential (primary) hypertension: Secondary | ICD-10-CM | POA: Diagnosis present

## 2020-10-25 DIAGNOSIS — F1420 Cocaine dependence, uncomplicated: Secondary | ICD-10-CM | POA: Diagnosis present

## 2020-10-25 DIAGNOSIS — F419 Anxiety disorder, unspecified: Secondary | ICD-10-CM | POA: Diagnosis present

## 2020-10-25 DIAGNOSIS — Z794 Long term (current) use of insulin: Secondary | ICD-10-CM | POA: Diagnosis not present

## 2020-10-25 DIAGNOSIS — Z79899 Other long term (current) drug therapy: Secondary | ICD-10-CM | POA: Diagnosis not present

## 2020-10-25 DIAGNOSIS — Z7984 Long term (current) use of oral hypoglycemic drugs: Secondary | ICD-10-CM

## 2020-10-25 DIAGNOSIS — F3340 Major depressive disorder, recurrent, in remission, unspecified: Secondary | ICD-10-CM | POA: Diagnosis present

## 2020-10-25 LAB — CBG MONITORING, ED
Glucose-Capillary: 186 mg/dL — ABNORMAL HIGH (ref 70–99)
Glucose-Capillary: 167 mg/dL — ABNORMAL HIGH (ref 70–99)
Glucose-Capillary: 254 mg/dL — ABNORMAL HIGH (ref 70–99)

## 2020-10-25 LAB — RESP PANEL BY RT-PCR (FLU A&B, COVID) ARPGX2
Influenza A by PCR: NEGATIVE
Influenza B by PCR: NEGATIVE
SARS Coronavirus 2 by RT PCR: NEGATIVE

## 2020-10-25 LAB — GLUCOSE, CAPILLARY
Glucose-Capillary: 322 mg/dL — ABNORMAL HIGH (ref 70–99)
Glucose-Capillary: 194 mg/dL — ABNORMAL HIGH (ref 70–99)

## 2020-10-25 MED ORDER — HYDROXYZINE HCL 25 MG PO TABS
25.0000 mg | ORAL_TABLET | Freq: Three times a day (TID) | ORAL | Status: DC | PRN
  Filled 2020-10-25: qty 10

## 2020-10-25 MED ORDER — THIAMINE HCL 100 MG PO TABS
100.0000 mg | ORAL_TABLET | Freq: Every day | ORAL | Status: DC
  Administered 2020-10-26 – 2020-10-28 (×3): 100 mg via ORAL
  Filled 2020-10-25 (×4): qty 1

## 2020-10-25 MED ORDER — INSULIN ASPART 100 UNIT/ML IJ SOLN
0.0000 [IU] | Freq: Three times a day (TID) | INTRAMUSCULAR | Status: DC
Start: 2020-10-25 — End: 2020-10-28
  Administered 2020-10-25: 11 [IU] via SUBCUTANEOUS
  Administered 2020-10-26: 5 [IU] via SUBCUTANEOUS
  Administered 2020-10-26 (×2): 8 [IU] via SUBCUTANEOUS
  Administered 2020-10-27 (×3): 5 [IU] via SUBCUTANEOUS
  Administered 2020-10-28: 3 [IU] via SUBCUTANEOUS

## 2020-10-25 MED ORDER — ALUM & MAG HYDROXIDE-SIMETH 200-200-20 MG/5ML PO SUSP: 30.0000 mL | ORAL | Status: DC | PRN

## 2020-10-25 MED ORDER — LORAZEPAM 1 MG PO TABS
1.0000 mg | ORAL_TABLET | Freq: Four times a day (QID) | ORAL | Status: DC | PRN
Start: 2020-10-25 — End: 2020-10-25

## 2020-10-25 MED ORDER — MAGNESIUM HYDROXIDE 400 MG/5ML PO SUSP: 30.0000 mL | Freq: Every day | ORAL | Status: DC | PRN

## 2020-10-25 MED ORDER — INSULIN GLARGINE 100 UNIT/ML ~~LOC~~ SOLN
30.0000 [IU] | Freq: Every day | SUBCUTANEOUS | Status: DC
  Administered 2020-10-26 – 2020-10-28 (×3): 30 [IU] via SUBCUTANEOUS
  Filled 2020-10-25 (×4): qty 0.3

## 2020-10-25 MED ORDER — LORAZEPAM 1 MG PO TABS
1.0000 mg | ORAL_TABLET | Freq: Four times a day (QID) | ORAL | Status: DC | PRN
  Administered 2020-10-25: 1 mg via ORAL
  Filled 2020-10-25: qty 1

## 2020-10-25 MED ORDER — HYDROCHLOROTHIAZIDE 25 MG PO TABS
25.0000 mg | ORAL_TABLET | Freq: Every day | ORAL | Status: DC
  Administered 2020-10-26 – 2020-10-27 (×2): 25 mg via ORAL
  Filled 2020-10-25 (×4): qty 1

## 2020-10-25 MED ORDER — TRAZODONE HCL 50 MG PO TABS
50.0000 mg | ORAL_TABLET | Freq: Every evening | ORAL | Status: DC | PRN
  Filled 2020-10-25: qty 7

## 2020-10-25 MED ORDER — ACETAMINOPHEN 325 MG PO TABS: 650.0000 mg | ORAL_TABLET | Freq: Four times a day (QID) | ORAL | Status: DC | PRN

## 2020-10-25 MED ORDER — LISINOPRIL 20 MG PO TABS
20.0000 mg | ORAL_TABLET | Freq: Every day | ORAL | Status: DC
  Administered 2020-10-26 – 2020-10-28 (×3): 20 mg via ORAL
  Filled 2020-10-25 (×3): qty 1
  Filled 2020-10-25: qty 7
  Filled 2020-10-25: qty 1

## 2020-10-25 MED ORDER — OLANZAPINE 5 MG PO TBDP: 5.0000 mg | ORAL_TABLET | Freq: Three times a day (TID) | ORAL | Status: DC | PRN

## 2020-10-25 MED ORDER — LORAZEPAM 1 MG PO TABS: 1.0000 mg | ORAL_TABLET | ORAL | Status: DC | PRN

## 2020-10-25 MED ORDER — ZIPRASIDONE MESYLATE 20 MG IM SOLR: 20.0000 mg | INTRAMUSCULAR | Status: DC | PRN

## 2020-10-25 NOTE — Progress Notes (Signed)
Pt accepted to [facility] 302-1    Patient meets inpatient criteria per Nira Conn, NP   Dr.Amy Mason Jim is the attending provider.    Call report to [phone]    Arther Abbott, RN @ [facility] notified.     Pt scheduled  to arrive at [facility] today by 1500.   Damita Dunnings, MSW, LCSW-A  11:43 AM 10/25/2020

## 2020-10-25 NOTE — Progress Notes (Signed)
   10/25/20 2130  Psych Admission Type (Psych Patients Only)  Admission Status Voluntary  Psychosocial Assessment  Patient Complaints Self-harm thoughts;Sadness  Eye Contact Fair  Facial Expression Pensive  Affect Appropriate to circumstance  Speech Logical/coherent  Interaction Assertive  Motor Activity Slow  Appearance/Hygiene Unremarkable  Behavior Characteristics Cooperative;Appropriate to situation  Mood Depressed;Sad  Thought Process  Coherency WDL  Content WDL  Delusions None reported or observed  Perception WDL  Hallucination None reported or observed  Judgment Impaired  Confusion None  Danger to Self  Current suicidal ideation? Passive  Self-Injurious Behavior No self-injurious ideation or behavior indicators observed or expressed   Agreement Not to Harm Self Yes  Description of Agreement Verbal Contract  Danger to Others  Danger to Others None reported or observed

## 2020-10-25 NOTE — Progress Notes (Signed)
Patient did attend the evening speaker AA meeting.  

## 2020-10-25 NOTE — Progress Notes (Signed)
Admission Note: Patient is a 55 year old male admitted to the unit from [facility] for suicidal ideation with plan to overdose on Cocaine.  Reports using drug for several nights.  Reports hearing voices calling out his name when he is under the influence.  Verbally contracts for safety while in the hospital.  Stated he is here to get his system cleaned and to get on the right medication.  Admission plan of care reviewed and consent for treatment signed.  Skin assessment and personal belongings completed.  Skin is dry and intact.  No contraband found.  Patient oriented to the unit, staff and room.  Routine safety checks initiated.  Verbalizes understanding of unit rules/p;protocols.  Patient is safe on the unit.
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
## 2020-10-25 NOTE — ED Notes (Signed)
Pt being assessed by TTS at this time  

## 2020-10-25 NOTE — ED Notes (Signed)
Pt ambulated out of dept with this nurse, belongings bagged and transported with pt. with Safe Transport

## 2020-10-25 NOTE — Tx Team (Signed)
Interdisciplinary Treatment and Diagnostic Plan Update  10/25/2020 Time of Session: 9:10am  Troy Gordon MRN: [id number]  Principal Diagnosis: <principal problem not specified>  Secondary Diagnoses: Active Problems:   MDD (major depressive disorder)   Current Medications:  Current Facility-Administered Medications  Medication Dose Route Frequency Provider Last Rate Last Admin   acetaminophen (TYLENOL) tablet 650 mg  650 mg Oral Q6H PRN Chalmers Guest, NP       alum & mag hydroxide-simeth (MAALOX/MYLANTA) 200-200-20 MG/5ML suspension 30 mL  30 mL Oral Q4H PRN Chalmers Guest, NP       atorvastatin (LIPITOR) tablet 80 mg  80 mg Oral Daily Lindell Spar I, NP   80 mg at 10/26/20 1115   buPROPion (WELLBUTRIN XL) 24 hr tablet 150 mg  150 mg Oral Daily Nwoko, Herbert Pun I, NP       gabapentin (NEURONTIN) capsule 100 mg  100 mg Oral TID Lindell Spar I, NP       hydrochlorothiazide (HYDRODIURIL) tablet 25 mg  25 mg Oral Daily Chalmers Guest, NP   25 mg at 10/26/20 4098   hydrOXYzine (ATARAX/VISTARIL) tablet 25 mg  25 mg Oral TID PRN Chalmers Guest, NP       insulin aspart (novoLOG) injection 0-15 Units  0-15 Units Subcutaneous TID WC Chalmers Guest, NP   5 Units at 10/26/20 1191   insulin glargine (LANTUS) injection 30 Units  30 Units Subcutaneous Daily Chalmers Guest, NP   30 Units at 10/26/20 0815   linagliptin (TRADJENTA) tablet 5 mg  5 mg Oral Daily Nwoko, Herbert Pun I, NP       lisinopril (ZESTRIL) tablet 20 mg  20 mg Oral Daily Chalmers Guest, NP   20 mg at 10/26/20 0810   OLANZapine zydis (ZYPREXA) disintegrating tablet 5 mg  5 mg Oral Q8H PRN Harlow Asa, MD       And   LORazepam (ATIVAN) tablet 1 mg  1 mg Oral PRN Harlow Asa, MD       And   ziprasidone (GEODON) injection 20 mg  20 mg Intramuscular PRN Harlow Asa, MD       magnesium hydroxide (MILK OF MAGNESIA) suspension 30 mL  30 mL Oral Daily PRN Chalmers Guest, NP       thiamine tablet 100 mg  100 mg Oral Daily Chalmers Guest, NP   100 mg at 10/26/20 4782   traZODone (DESYREL) tablet 50 mg  50 mg Oral QHS PRN Chalmers Guest, NP       PTA Medications: Medications Prior to Admission  Medication Sig Dispense Refill Last Dose   atorvastatin (LIPITOR) 80 MG tablet TAKE 1 TABLET DAILY (Patient not taking: No sig reported) 90 tablet 3    buPROPion (WELLBUTRIN XL) 150 MG 24 hr tablet Take 1 tablet (150 mg total) by mouth daily. (Patient not taking: Reported on 10/24/2020) 30 tablet 0    insulin glargine (LANTUS SOLOSTAR) 100 UNIT/ML Solostar Pen Inject 30 Units into the skin daily. 3 mL PRN    lisinopril-hydrochlorothiazide (ZESTORETIC) 20-25 MG tablet Take 1 tablet by mouth daily. 30 tablet 0    sildenafil (VIAGRA) 100 MG tablet TAKE 1 TABLET BY MOUTH 1 HOUR BEFORE INTERCOURSE 30 tablet 3    sitaGLIPtin (JANUVIA) 100 MG tablet Take 1 tablet (100 mg total) by mouth daily. 14 tablet 0     Patient Stressors:    Patient Strengths:    Treatment Modalities: Medication Management, Group  therapy, Case management,  1 to 1 session with clinician, Psychoeducation, Recreational therapy.   Physician Treatment Plan for Primary Diagnosis: <principal problem not specified> Long Term Goal(s):     Short Term Goals:    Medication Management: Evaluate patient's response, side effects, and tolerance of medication regimen.  Therapeutic Interventions: 1 to 1 sessions, Unit Group sessions and Medication administration.  Evaluation of Outcomes: Not Met  Physician Treatment Plan for Secondary Diagnosis: Active Problems:   MDD (major depressive disorder)  Long Term Goal(s):     Short Term Goals:       Medication Management: Evaluate patient's response, side effects, and tolerance of medication regimen.  Therapeutic Interventions: 1 to 1 sessions, Unit Group sessions and Medication administration.  Evaluation of Outcomes: Not Met   RN Treatment Plan for Primary Diagnosis: <principal problem not specified> Long Term Goal(s):  Knowledge of disease and therapeutic regimen to maintain health will improve  Short Term Goals: Ability to remain free from injury will improve, Ability to demonstrate self-control, Ability to participate in decision making will improve, Ability to verbalize feelings will improve, Ability to disclose and discuss suicidal ideas, and Ability to identify and develop effective coping behaviors will improve  Medication Management: RN will administer medications as ordered by provider, will assess and evaluate patient's response and provide education to patient for prescribed medication. RN will report any adverse and/or side effects to prescribing provider.  Therapeutic Interventions: 1 on 1 counseling sessions, Psychoeducation, Medication administration, Evaluate responses to treatment, Monitor vital signs and CBGs as ordered, Perform/monitor CIWA, COWS, AIMS and Fall Risk screenings as ordered, Perform wound care treatments as ordered.  Evaluation of Outcomes: Not Met   LCSW Treatment Plan for Primary Diagnosis: <principal problem not specified> Long Term Goal(s): Safe transition to appropriate next level of care at discharge, Engage patient in therapeutic group addressing interpersonal concerns.  Short Term Goals: Engage patient in aftercare planning with referrals and resources, Increase social support, Increase emotional regulation, Facilitate acceptance of mental health diagnosis and concerns, Facilitate patient progression through stages of change regarding substance use diagnoses and concerns, Identify triggers associated with mental health/substance abuse issues, and Increase skills for wellness and recovery  Therapeutic Interventions: Assess for all discharge needs, 1 to 1 time with Social worker, Explore available resources and support systems, Assess for adequacy in community support network, Educate family and significant other(s) on suicide prevention, Complete Psychosocial Assessment,  Interpersonal group therapy.  Evaluation of Outcomes: Not Met   Progress in Treatment: Attending groups: No. Participating in groups: No. Taking medication as prescribed: Yes. Toleration medication: Yes. Family/Significant other contact made: Yes, individual(s) contacted:  If consents are provided  Patient understands diagnosis: No. Discussing patient identified problems/goals with staff: Yes. Medical problems stabilized or resolved: Yes. Denies suicidal/homicidal ideation: Yes. Issues/concerns per patient self-inventory: No.   New problem(s) identified: No, Describe:  None   New Short Term/Long Term Goal(s): medication stabilization, elimination of SI thoughts, development of comprehensive mental wellness plan.   Patient Goals: "To get sober and get back on my medications"  Discharge Plan or Barriers: Patient recently admitted. CSW will continue to follow and assess for appropriate referrals and possible discharge planning.   Reason for Continuation of Hospitalization: Depression Homicidal ideation Medication stabilization Suicidal ideation Withdrawal symptoms  Estimated Length of Stay: 3 to 5 days   Attendees: Patient: Troy Gordon 10/26/2020   Physician: Lestine Mount, DO 10/26/2020   Nursing:  10/26/2020   RN Care Manager: 10/26/2020   Social Worker:  Verdis Frederickson, [place] 10/26/2020   Recreational Therapist:  10/26/2020   Other:  10/26/2020   Other:  10/26/2020   Other: 10/26/2020     Scribe for Treatment Team: Darleen Crocker, [place] 10/25/2020 2:55 PM

## 2020-10-25 NOTE — Tx Team (Signed)
Initial Treatment Plan 10/25/2020 3:28 PM [name] HAL:[id number]    PATIENT STRESSORS: Health problems Medication change or noncompliance Substance abuse   PATIENT STRENGTHS: Capable of independent living Communication skills Motivation for treatment/growth   PATIENT IDENTIFIED PROBLEMS: "Get system clean"  "Get on the right medication"  Suicidal ideation  Depression  Medication noncompliance  Substance Abuse           DISCHARGE CRITERIA:  Ability to meet basic life and health needs Motivation to continue treatment in a less acute level of care Safe-care adequate arrangements made  PRELIMINARY DISCHARGE PLAN: Attend aftercare/continuing care group Outpatient therapy Return to previous living arrangement  PATIENT/FAMILY INVOLVEMENT: This treatment plan has been presented to and reviewed with the patient, Troy Gordon, and/or family member.  The patient and family have been given the opportunity to ask questions and make suggestions.  Clarene Critchley, RN 10/25/2020, 3:28 PM

## 2020-10-26 DIAGNOSIS — F3340 Major depressive disorder, recurrent, in remission, unspecified: Secondary | ICD-10-CM | POA: Diagnosis present

## 2020-10-26 DIAGNOSIS — F332 Major depressive disorder, recurrent severe without psychotic features: Secondary | ICD-10-CM

## 2020-10-26 DIAGNOSIS — F1420 Cocaine dependence, uncomplicated: Secondary | ICD-10-CM | POA: Diagnosis not present

## 2020-10-26 DIAGNOSIS — F331 Major depressive disorder, recurrent, moderate: Secondary | ICD-10-CM | POA: Diagnosis present

## 2020-10-26 LAB — LIPID PANEL
Cholesterol: 312 mg/dL — ABNORMAL HIGH (ref 0–200)
HDL: 48 mg/dL (ref >40)
LDL Cholesterol: 222 mg/dL — ABNORMAL HIGH (ref 0–99)
Total CHOL/HDL Ratio: 6.5 RATIO
Triglycerides: 209 mg/dL — ABNORMAL HIGH (ref <150)
VLDL: 42 mg/dL — ABNORMAL HIGH (ref 0–40)

## 2020-10-26 LAB — GLUCOSE, CAPILLARY
Glucose-Capillary: 248 mg/dL — ABNORMAL HIGH (ref 70–99)
Glucose-Capillary: 292 mg/dL — ABNORMAL HIGH (ref 70–99)
Glucose-Capillary: 266 mg/dL — ABNORMAL HIGH (ref 70–99)
Glucose-Capillary: 135 mg/dL — ABNORMAL HIGH (ref 70–99)

## 2020-10-26 LAB — TSH: TSH: 2.069 u[IU]/mL (ref 0.350–4.500)

## 2020-10-26 MED ORDER — BUPROPION HCL ER (XL) 150 MG PO TB24
150.0000 mg | ORAL_TABLET | Freq: Every day | ORAL | Status: DC
  Administered 2020-10-26 – 2020-10-28 (×3): 150 mg via ORAL
  Filled 2020-10-26: qty 7
  Filled 2020-10-26 (×4): qty 1

## 2020-10-26 MED ORDER — GABAPENTIN 100 MG PO CAPS
100.0000 mg | ORAL_CAPSULE | Freq: Three times a day (TID) | ORAL | Status: DC
  Administered 2020-10-26 – 2020-10-28 (×7): 100 mg via ORAL
  Filled 2020-10-26: qty 21
  Filled 2020-10-26 (×5): qty 1
  Filled 2020-10-26: qty 21
  Filled 2020-10-26 (×4): qty 1
  Filled 2020-10-26: qty 21

## 2020-10-26 MED ORDER — LINAGLIPTIN 5 MG PO TABS
5.0000 mg | ORAL_TABLET | Freq: Every day | ORAL | Status: DC
  Administered 2020-10-26 – 2020-10-28 (×3): 5 mg via ORAL
  Filled 2020-10-26 (×4): qty 1
  Filled 2020-10-26: qty 7

## 2020-10-26 MED ORDER — ATORVASTATIN CALCIUM 40 MG PO TABS
80.0000 mg | ORAL_TABLET | Freq: Every day | ORAL | Status: DC
  Administered 2020-10-26 – 2020-10-28 (×3): 80 mg via ORAL
  Filled 2020-10-26: qty 14
  Filled 2020-10-26 (×4): qty 1

## 2020-10-26 NOTE — Progress Notes (Signed)
Inpatient Diabetes Program Recommendations  AACE/ADA: New Consensus Statement on Inpatient Glycemic Control (2015)  Target Ranges:  Prepandial:   less than 140 mg/dL      Peak postprandial:   less than 180 mg/dL (1-2 hours)      Critically ill patients:  140 - 180 mg/dL   Lab Results  Component Value Date   GLUCAP 248 (H) 10/26/2020   HGBA1C 11.3 (H) 10/24/2020    Review of Glycemic Control  Diabetes history: DM2 Outpatient Diabetes medications: Lantus 30 units QD, Januvia 100 mg QD Current orders for Inpatient glycemic control: Lantus 30 units QD, Novolog 0-15 units TID with meals  HgbA1C - 11.3% poor control   Inpatient Diabetes Program Recommendations:    Consider adding Januvia 100 mg QD Add Novolog 0-5 units QHS Add Novolog 6 units TID with meals if eating > 50%  Pt will need to f/u with PCP for management of his uncontrolled DM.   Will follow glucose trends.   Thank you. Ailene Ards, RD, LDN, CDE Inpatient Diabetes Coordinator [phone]

## 2020-10-26 NOTE — BHH Group Notes (Signed)
Type of Therapy and Topic:  Group Therapy:  Self-Esteem   Participation Level:  Active  Description of Group: This group addressed positive self-esteem. Patients were given a worksheet with a blank shield. Patients were asked what a shield is and when it is used. Patients were asked to list, draw, or write protective factors in the their lives on their shields. Patients discussed the words, ideas and drawings that they put on their shield. Patients were encouraged to have a daily reflection of positive characteristics/ protective factors.  Therapeutic Goals Patient will verbalize two of their positive qualities Patient will demonstrate insight but naming social supports in their lives Patient will verbalize their feelings when voicing positive self affirmations and when voicing positive affirmations of others Patients will discuss the potential positive impact on their wellness/recovery of focusing on positive traits of self and others.  Summary of Patient Progress: Pt participated during introductions and stated he would love to visit the [place] because of their good food. Pt interacted appropriately in group discussion and was attentive throughout.   Fredirick Lathe, LCSWA Clinicial Social Worker [employer]
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
## 2020-10-26 NOTE — Progress Notes (Signed)
D:  Patient denied SI and HI, contracts for safety.  Denied A/V hallucinations.  Denied pain. A:  Medications administered per MD orders.  Emotional support and encouragement given patient. R:  Safety maintained with 15 minute checks.  

## 2020-10-26 NOTE — Plan of Care (Signed)
Nurse discussed coping skills with patient.  

## 2020-10-26 NOTE — [facility] Suicide Risk Assessment (Signed)
[facility] Admission Suicide Risk Assessment   Nursing information obtained from:  Patient Demographic factors:  Male Current Mental Status:  Self-harm thoughts Loss Factors:  Financial problems / change in socioeconomic status, Decrease in vocational status Historical Factors:  Impulsivity Risk Reduction Factors:  NA  Total Time spent with patient: 30 minutes Principal Problem: Major depressive disorder, recurrent episode, moderate (HCC) Diagnosis:  Principal Problem:   Major depressive disorder, recurrent episode, moderate (HCC) Active Problems:   Cocaine use disorder, severe, dependence ([place])  Subjective Data: Medical record reviewed.  Patient's case discussed in detail with members of the treatment team.  I met with and evaluated the patient on the unit today.  Troy Gordon is a 55 year old male with a history of major depressive disorder, cocaine use disorder, polysubstance abuse (cocaine, cannabis, alcohol), diabetes mellitus, hypertension, hyperlipidemia who presented on 10/24/2020 to [facility] ED for suicidal ideation with reported attempt to overdose on cocaine the night before and thoughts of harming others.  On evaluation with me today, patient is pleasant, cooperative, reports depressed anxious mood, displays congruent affect and organized coherent thought processes.  There is no tremor or other motor abnormality observed on examination.  Patient reports feeling depressed for a period of months and describes symptoms of depressed mood, trouble sleeping, decreased energy, problems concentrating, hopelessness, anxiety, intermittent suicidal ideation and recent suicide attempt via overdose on cocaine just prior to the current admission.  Patient states he also had thoughts of harming others prior to presentation but denies AI or HI currently.  He denies AH or VH but states that he experienced AH of hearing his name called and VH of "images in the sky" while he was under the influence of cocaine  with the most recent hallucinations occurring on 10/23/2020.  Per chart review, patient presented to [facility] ED in March 2022 reporting suicidal ideation and was admitted to [facility].  Patient cannot recall any details regarding the hospital admission.  It appears from chart review that he was prescribed Wellbutrin XL 176m daily in the spring for depression but patient states that he has not been taking it.  He also reports that he has not been taking prescribed medications for his diabetes, hypertension or hyperlipidemia prior to admission.  Patient reports intermittent use of crack cocaine and states he increased his use beginning 10/20/2020 with an attempt to overdose on cocaine to end his life.  His most recent cocaine use was on 10/23/2020.  Patient consumes two 40 ounce containers of beer per day.  He denies any history of withdrawal symptoms, DTs or seizures.  He denies any current symptoms of alcohol withdrawal.  Patient also reports infrequent use of marijuana.  He denies use of other drugs and denies tobacco or nicotine use.  Patient denies any history of seizures and is receptive to starting treatment today with Wellbutrin for depression.  Patient states that his mother may have had mental health issues but he does not know the details.  He reports some maternal relatives had issues with alcohol.  He thinks his father may have had some drug issues.  He denies any known family history of suicide attempt.  Continued Clinical Symptoms:    The "Alcohol Use Disorders Identification Test", Guidelines for Use in Primary Care, Second Edition.  World HPharmacologist([employer] Score between 0-7:  no or low risk or alcohol related problems. Score between 8-15:  moderate risk of alcohol related problems. Score between 16-19:  high risk of  alcohol related problems. Score 20 or above:  warrants further diagnostic evaluation for alcohol dependence and  treatment.   CLINICAL FACTORS:   Severe Anxiety and/or Agitation Depression:   Comorbid alcohol abuse/dependence Hopelessness Impulsivity Insomnia Alcohol/Substance Abuse/Dependencies Previous Psychiatric Diagnoses and Treatments   Musculoskeletal: Strength & Muscle Tone: within normal limits Gait & Station: normal Patient leans: N/A  Psychiatric Specialty Exam:  Presentation  General Appearance: Disheveled  Eye Contact:Good  Speech:Clear and Coherent; Normal Rate  Speech Volume:Normal  Handedness:Right   Mood and Affect  Mood:Depressed; Anxious  Affect:Congruent   Thought Process  Thought Processes:Coherent; Goal Directed  Descriptions of Associations:Intact  Orientation:Full (Time, Place and Person)  Thought Content:Logical  History of Schizophrenia/Schizoaffective disorder:No  Duration of Psychotic Symptoms:N/A  Hallucinations:Hallucinations: None  Ideas of Reference:None  Suicidal Thoughts:Suicidal Thoughts: Yes, Passive SI Passive Intent and/or Plan: Without Intent; Without Plan  Homicidal Thoughts:Homicidal Thoughts: No   Sensorium  Memory:Immediate Good; Recent Good  Judgment:Fair  Insight:Fair   Executive Functions  Concentration:Good  Attention Span:Good  [place]  Language:Good   Psychomotor Activity  Psychomotor Activity:Psychomotor Activity: Normal   Assets  Assets:Communication Skills; Desire for Improvement; Housing; Resilience   Sleep  Sleep:Sleep: Fair Number of Hours of Sleep: 5.75    Physical Exam: Physical Exam Vitals and nursing note reviewed.  Constitutional:      General: He is not in acute distress.    Appearance: Normal appearance. He is not diaphoretic.  HENT:     Head: Normocephalic and atraumatic.  Cardiovascular:     Rate and Rhythm: Normal rate.  Pulmonary:     Effort: Pulmonary effort is normal.  Neurological:     General: No focal deficit present.      Mental Status: He is alert and oriented to person, place, and time.   Review of Systems  Constitutional:  Negative for diaphoresis and fever.  HENT:  Negative for sore throat.   Respiratory:  Negative for cough and shortness of breath.   Cardiovascular:  Negative for chest pain and palpitations.  Gastrointestinal:  Negative for constipation, diarrhea, nausea and vomiting.  Musculoskeletal:  Negative for myalgias.  Neurological:  Negative for dizziness, tremors, seizures and headaches.  Psychiatric/Behavioral:  Positive for depression, substance abuse and suicidal ideas. Negative for hallucinations. The patient is nervous/anxious and has insomnia.   Blood pressure (!) 156/98, pulse 74, temperature 97.8 F (36.6 C), temperature source Oral, resp. rate 16, height _0  (1.727 m), weight 70.3 kg, SpO2 100 %. Body mass index is 23.57 kg/m.   COGNITIVE FEATURES THAT CONTRIBUTE TO RISK:  None    SUICIDE RISK:   Severe:  Frequent, intense, and enduring suicidal ideation, specific plan, no subjective intent, but some objective markers of intent (i.e., choice of lethal method), the method is accessible, some limited preparatory behavior, evidence of impaired self-control, severe dysphoria/symptomatology, multiple risk factors present, and few if any protective factors, particularly a lack of social support.  PLAN OF CARE: The patient is a 55 year old male with a history of major depressive disorder and polysubstance abuse (cocaine, alcohol, cannabis) admitted with worsening symptoms of depression and suicidal ideation in the context of multiple psychosocial stressors and substance abuse.  Patient also has comorbid hypertension, diabetes and hyperlipidemia.  He has not been taking any medications prior to admission and is currently hypertensive.  He has been admitted to the [facility] inpatient unit for crisis stabilization and treatment.  Continue every 15-minute observation level.  Encourage participation in  group therapy  and therapeutic milieu.  Available lab results reviewed.  CMP showed sodium of 134, glucose of 439, creatinine of 1.3 and otherwise WNL.  CBC revealed platelets of 109 and otherwise WNL.  Differential was not performed.  Acetaminophen level and salicylate level were undetectable.  TSH was WNL.  Hemoglobin A1c was 11.3.  BAL was <10.  Influenza A, influenza B and coronavirus testing were negative.  Capillary blood glucose readings have ranged from high of 322 to a low of 194 over the last 24 hours.  Urine drug screen was positive for cocaine.  I have ordered CBC with differential and repeat CMP to be drawn with a.m. draw tomorrow.  EKG performed 10/24/2020 at [facility] ED revealed normal sinus rhythm, septal infarct age undetermined, T wave abnormality consider inferolateral ischemia, ventricular rate of 65 and QT/QTc of 425/445.  We will restart Wellbutrin XL 150 mg daily for depression.  Gabapentin 100 mg 3 times daily has been started for peripheral neuropathy.  Hydrochlorothiazide 25 mg daily and Zestril 20 mg daily have been restarted for hypertension.  Lipitor 80 mg daily has been restarted for hyperlipidemia.  We will continue Lantus insulin 30 units daily, sliding scale insulin and Tradjenta 5 mg daily for diabetes.  See MAR.  Estimated length of stay 2 to 4 days.  Patient would benefit from participation in residential or outpatient substance abuse treatment program after discharge if he is willing to attend.  He will need referral to outpatient mental health follow-up.  I certify that inpatient services furnished can reasonably be expected to improve the patient's condition.   Arthor Captain, MD 10/26/2020, 1:59 PM

## 2020-10-26 NOTE — Progress Notes (Signed)
Recreation Therapy Notes  Date:  8.17.22 Time: 0930 Location: [facility] Hall Dayroom  Group Topic: Stress Management  Goal Area(s) Addresses:  Patient will identify positive stress management techniques. Patient will identify benefits of using stress management post d/c.  Intervention: Stress Management  Activity:  Meditation.  LRT played a meditation that focused on calming anxiety.  Meditation spoke of acknowledging your feelings but not letting them take over your thoughts and emotions.  Education:  Stress Management, Discharge Planning.   Education Outcome: Acknowledges Education  Clinical Observations/Feedback: Pt did not attend group session.    Kaven Cumbie Sammuel Bailiff, LRT/CTRS         Caroll Rancher A 10/26/2020 12:10 PM

## 2020-10-26 NOTE — H&P (Signed)
Psychiatric Admission Assessment Adult     Date of Service:             Type:Physical Patient Identification: Troy Fearsimothy HoldenMRN:  [id number]  Date of Evaluation:  10/26/2020  Chief Complaint:  MDD (major depressive disorder) [F32.9]  Principal Diagnosis: MDD (major depressive disorder)  Diagnosis:  Principal Problem:   MDD (major depressive disorder) Active Problems:   Cocaine use disorder, severe, dependence (HCC)  History of Present Illness: This is the first psychiatric admission in this [facility] for this 55 year old AA male with hx of Major depressive disorder, alcohol use disorder, cocaine use disorder, cannabis use disorder & other chronic medical issues. [facility] with complain of worsening suicidal ideations & had attempted to overdose on cocaine in the last several nights prior to coming to the hospital. Chart review indicated that patient reported that he was hospitalized for his depression in March of this year, was started on medications, but was unable to afford the medications after discharge. As a result, has not been on his mental medications in the last 3 months triggering worsening of his depression.suicidal ideations. Patient UDS was positive for cocaine. During this evaluation, Troy Gordon reports,  "A friend took me to the [facility] on Monday (2 days ago). I was wanting to kill myself. I have been feeling suicidal for a while. It started in March of this year, 2022. I was involved in a situation that got me very depressed & suicidal. I went to [facility] for [facility] at the time. When I got there, they realized that my diabetes was not doing good. They kept me for a week, got my diabetes stable, then referred me to the [facility] in [place], [place] for [place] mental health treatment. I was at this hospital for about a week. They started me on a depression medicine that did help me. I was discharged on  this medicine, but was unable to afford it. I never did pick this medicine up from the pharmacy because I did not have the money. I did okay for two months, then the depression & suicidal thoughts came back. To help myself feel better, I resumed drug use. I was smoking crack cocaine & weed almost about every day. I started using cocaine 2 year ago after the deaths of both my parents in a car crash. I used cocaine to deal with my grief & it helped. I have bad depression, however, when coming off cocaine, I hear voices & see unusual images in the sky, like funny looking people that changed to animals, the animals changing to funny looking trees. The last time I saw these images was last Sunday. That was the reason I went to the hospital. I need to get back on the depression medicine that helped me feel better when I was at [facility]. I'm still feeling like hurting myself, but I know I'm in a safe place here. I also need to get back on the diabetes medicine that was recommended for me by the [facility] on [address]. This medicine is called Tradjenta. I need help. I have no money, no job & no support system. I have had problems most of my life learning to do things. Nothing comes easy for me, I struggle, but never knew why I'm that way. I have problem grasping information".  Associated Signs/Symptoms:  Depression Symptoms:  depressed mood, insomnia, fatigue, hopelessness,  suicidal thoughts without plan, anxiety,  Duration of Depression Symptoms: Greater than two weeks  (Hypo) Manic Symptoms:   Denies any hypomanic symptoms.  Anxiety Symptoms:  Excessive Worry,  Psychotic Symptoms:   Currently denies any AVH, delusional thoughts or paranoia. However, says he does AVH when coming off cocaine.  PTSD Symptoms: NA  Total Time spent with patient: 1 hour  Past Psychiatric History: Major depressive disorder, Cocaine use disorder, Cannabis use disorder.   Is the  patient at risk to self? Yes.    Has the patient been a risk to self in the past 6 months? Yes.    Has the patient been a risk to self within the distant past? Yes.    Is the patient a risk to others? No.  Has the patient been a risk to others in the past 6 months? No.  Has the patient been a risk to others within the distant past? No.   Prior Inpatient Therapy: Yes[facility] [place] [place]. Prior Outpatient Therapy:    Alcohol Screening: Reports drinks alcohol twice on weekly basic.   Substance Abuse History in the last 12 months:  Yes.    Consequences of Substance Abuse: Discussed witg patient during this admission evaluation. Medical Consequences:  Liver damage, Possible death by overdose Legal Consequences:  Arrests, jail time, Loss of driving privilege. Family Consequences:  Family discord, divorce and or separation.   Previous Psychotropic Medications:  Yes, Wellbutrin XL 150 mg.  Psychological Evaluations: No   Past Medical History:  Past Medical History:  Diagnosis Date   Depression    Diabetes mellitus (HCC)    Dyslipidemia    Hyperlipidemia    Hypertension     Past Surgical History:  Procedure Laterality Date   CIRCUMCISION     as an adult   Family History:  Family History  Problem Relation Age of Onset   Diabetes Maternal Grandmother    Hypertension Maternal Grandmother    Hypertension Father    Family Psychiatric  History: Patient reports, "I know there is some mental health issues on my mother's side of the family. My mother & my great aunt had something wrong mentally, I just do not know what they were".  Tobacco Screening: Patient reports, "No, I do not smoke cigarettes, just occasional weed".  Social History: Single, has one son, lives in [place], [place]. Unemployed. Says was raised by his grand-parents. Social History   Substance and Sexual Activity  Alcohol Use Yes   Comment: occasionally beer     Social History   Substance and  Sexual Activity  Drug Use Yes   Types: Marijuana, Cocaine   Comment: last used crack 06/07/20    Additional Social History:  Allergies:  No Known Allergies  Lab Results:  Results for orders placed or performed during the hospital encounter of 10/25/20 (from the past 48 hour(s))  Glucose, capillary     Status: Abnormal   Collection Time: 10/25/20  5:39 PM  Result Value Ref Range   Glucose-Capillary 322 (H) 70 - 99 mg/dL    Comment: Glucose reference range applies only to samples taken after fasting for at least 8 hours.  Glucose, capillary     Status: Abnormal   Collection Time: 10/25/20  9:13 PM  Result Value Ref Range   Glucose-Capillary 194 (H) 70 - 99 mg/dL    Comment: Glucose reference range applies only to samples taken after fasting for at least 8 hours.  Glucose, capillary  Status: Abnormal   Collection Time: 10/26/20  6:03 AM  Result Value Ref Range   Glucose-Capillary 248 (H) 70 - 99 mg/dL    Comment: Glucose reference range applies only to samples taken after fasting for at least 8 hours.   Comment 1 Notify RN    Comment 2 Document in Chart   Lipid panel     Status: Abnormal   Collection Time: 10/26/20  6:22 AM  Result Value Ref Range   Cholesterol 312 (H) 0 - 200 mg/dL   Triglycerides 409 (H) <150 mg/dL   HDL 48 >81 mg/dL   Total CHOL/HDL Ratio 6.5 RATIO   VLDL 42 (H) 0 - 40 mg/dL   LDL Cholesterol 191 (H) 0 - 99 mg/dL    Comment:        Total Cholesterol/HDL:CHD Risk Coronary Heart Disease Risk Table                     Men   Women  1/2 Average Risk   3.4   3.3  Average Risk       5.0   4.4  2 X Average Risk   9.6   7.1  3 X Average Risk  23.4   11.0        Use the calculated Patient Ratio above and the CHD Risk Table to determine the patient's CHD Risk.        ATP III CLASSIFICATION (LDL):  <100     mg/dL   Optimal  478-295  mg/dL   Near or Above                    Optimal  130-159  mg/dL   Borderline  621-308  mg/dL   High  >657     mg/dL   Very  High Performed at [facility], [address] [place], [place] [address]   TSH     Status: None   Collection Time: 10/26/20  6:22 AM  Result Value Ref Range   TSH 2.069 0.350 - 4.500 uIU/mL    Comment: Performed by a 3rd Generation assay with a functional sensitivity of <=0.01 uIU/mL. Performed at [facility], [address] [place], [place] [address]   Glucose, capillary     Status: Abnormal   Collection Time: 10/26/20 12:19 PM  Result Value Ref Range   Glucose-Capillary 292 (H) 70 - 99 mg/dL    Comment: Glucose reference range applies only to samples taken after fasting for at least 8 hours.   Blood Alcohol level:  Lab Results  Component Value Date   ETH <10 10/24/2020   ETH <10 06/07/2020   Metabolic Disorder Labs:  Lab Results  Component Value Date   HGBA1C 11.3 (H) 10/24/2020   MPG 277.61 10/24/2020   MPG 257.52 06/07/2020   No results found for: PROLACTIN Lab Results  Component Value Date   CHOL 312 (H) 10/26/2020   TRIG 209 (H) 10/26/2020   HDL 48 10/26/2020   CHOLHDL 6.5 10/26/2020   VLDL 42 (H) 10/26/2020   LDLCALC 222 (H) 10/26/2020   LDLCALC 116 (H) 12/23/2019   Current Medications: Current Facility-Administered Medications  Medication Dose Route Frequency Provider Last Rate Last Admin   acetaminophen (TYLENOL) tablet 650 mg  650 mg Oral Q6H PRN Novella Olive, NP       alum & mag hydroxide-simeth (MAALOX/MYLANTA) 200-200-20 MG/5ML suspension 30 mL  30 mL Oral Q4H PRN Novella Olive,  NP       atorvastatin (LIPITOR) tablet 80 mg  80 mg Oral Daily Armandina Stammer I, NP   80 mg at 10/26/20 1115   buPROPion (WELLBUTRIN XL) 24 hr tablet 150 mg  150 mg Oral Daily Armandina Stammer I, NP   150 mg at 10/26/20 1200   gabapentin (NEURONTIN) capsule 100 mg  100 mg Oral TID Armandina Stammer I, NP   100 mg at 10/26/20 1214   hydrochlorothiazide (HYDRODIURIL) tablet 25 mg  25 mg Oral Daily Novella Olive, NP   25 mg at 10/26/20 6789    hydrOXYzine (ATARAX/VISTARIL) tablet 25 mg  25 mg Oral TID PRN Novella Olive, NP       insulin aspart (novoLOG) injection 0-15 Units  0-15 Units Subcutaneous TID WC Novella Olive, NP   8 Units at 10/26/20 1219   insulin glargine (LANTUS) injection 30 Units  30 Units Subcutaneous Daily Novella Olive, NP   30 Units at 10/26/20 0815   linagliptin (TRADJENTA) tablet 5 mg  5 mg Oral Daily Armandina Stammer I, NP   5 mg at 10/26/20 1200   lisinopril (ZESTRIL) tablet 20 mg  20 mg Oral Daily Novella Olive, NP   20 mg at 10/26/20 0810   OLANZapine zydis (ZYPREXA) disintegrating tablet 5 mg  5 mg Oral Q8H PRN Comer Locket, MD       And   LORazepam (ATIVAN) tablet 1 mg  1 mg Oral PRN Comer Locket, MD       And   ziprasidone (GEODON) injection 20 mg  20 mg Intramuscular PRN Comer Locket, MD       magnesium hydroxide (MILK OF MAGNESIA) suspension 30 mL  30 mL Oral Daily PRN Novella Olive, NP       thiamine tablet 100 mg  100 mg Oral Daily Novella Olive, NP   100 mg at 10/26/20 3810   traZODone (DESYREL) tablet 50 mg  50 mg Oral QHS PRN Novella Olive, NP       PTA Medications: Medications Prior to Admission  Medication Sig Dispense Refill Last Dose   atorvastatin (LIPITOR) 80 MG tablet TAKE 1 TABLET DAILY (Patient not taking: No sig reported) 90 tablet 3    buPROPion (WELLBUTRIN XL) 150 MG 24 hr tablet Take 1 tablet (150 mg total) by mouth daily. (Patient not taking: Reported on 10/24/2020) 30 tablet 0    insulin glargine (LANTUS SOLOSTAR) 100 UNIT/ML Solostar Pen Inject 30 Units into the skin daily. 3 mL PRN    lisinopril-hydrochlorothiazide (ZESTORETIC) 20-25 MG tablet Take 1 tablet by mouth daily. 30 tablet 0    sildenafil (VIAGRA) 100 MG tablet TAKE 1 TABLET BY MOUTH 1 HOUR BEFORE INTERCOURSE 30 tablet 3    sitaGLIPtin (JANUVIA) 100 MG tablet Take 1 tablet (100 mg total) by mouth daily. 14 tablet 0    Musculoskeletal: Strength & Muscle Tone: within normal limits Gait & Station:  normal Patient leans: N/A  Psychiatric Specialty Exam:  Presentation  General Appearance:  Disheveled  Eye Contact: Good  Speech: Clear and Coherent; Normal Rate Speech Volume: Normal  Handedness: Right  Mood and Affect  Mood: Anxious; Depressed; Worthless  Affect: Therapist, music Process  Thought Processes: Coherent; Goal Directed  Duration of Psychotic Symptoms: N/A  Past Diagnosis of Schizophrenia or Psychoactive disorder: No (Reports hx of AVH when coming off  cocaine.)  Descriptions of Associations:Intact  Orientation:Full (Time, Place and Person)  Thought  Content:Logical  Hallucinations:Hallucinations: None (Reports hx of AVH when coming off  cocaine.) Ideas of Reference:None  Suicidal Thoughts:Suicidal Thoughts: Yes, Passive SI Passive Intent and/or Plan: Without Intent; Without Plan; Without Means to Carry Out; Without Access to Means  Homicidal Thoughts:Homicidal Thoughts: No  Sensorium  Memory: Immediate Good; Recent Good; Remote Good  Judgment: Fair  Insight: Fair  Art therapist  Concentration: Good  Attention Span: Good  Recall: Good  Fund of Knowledge: Fair  Language: Good  Psychomotor Activity  Psychomotor Activity: Psychomotor Activity: Normal  Assets  Assets: Communication Skills; Desire for Improvement; Housing; Resilience  Sleep  Sleep: Sleep: Fair Number of Hours of Sleep: 5.75  Physical Exam: Physical Exam Vitals and nursing note reviewed.  HENT:     Head: Normocephalic.     Nose: Nose normal.     Mouth/Throat:     Pharynx: Oropharynx is clear.  Eyes:     Pupils: Pupils are equal, round, and reactive to light.  Cardiovascular:     Rate and Rhythm: Normal rate.     Pulses: Normal pulses.  Pulmonary:     Effort: Pulmonary effort is normal.  Genitourinary:    Comments: Deferred Musculoskeletal:        General: Normal range of motion.     Cervical back: Normal range of motion.   Skin:    General: Skin is warm and dry.  Neurological:     General: No focal deficit present.     Mental Status: He is alert and oriented to person, place, and time. Mental status is at baseline.   Review of Systems  Constitutional:  Negative for chills, fever and malaise/fatigue.  HENT:  Negative for congestion and sore throat.   Eyes:  Negative for blurred vision.  Respiratory:  Negative for cough, shortness of breath and wheezing.   Cardiovascular:  Negative for chest pain and palpitations.  Gastrointestinal:  Negative for abdominal pain, constipation, diarrhea, heartburn, nausea and vomiting.  Genitourinary:  Negative for dysuria.  Musculoskeletal:  Negative for joint pain and myalgias.       Patient complained of some tingling & numbing sensations to his lower extremities.  Skin: Negative.   Neurological:  Positive for tingling (To lower extremities.). Negative for dizziness, tremors, sensory change, speech change, focal weakness, seizures, loss of consciousness, weakness and headaches.  Endo/Heme/Allergies:  Negative for environmental allergies and polydipsia. Does not bruise/bleed easily.       Allergies: NKDA  Psychiatric/Behavioral:  Positive for depression (Rates depression "#7".), substance abuse (UDS (+) for cocaine.) and suicidal ideas (Reports "passive SI". Denies any plans or intent. Able to verbally contract for safety.). Negative for hallucinations (Reports hx of AVH when coming off cocaine) and memory loss. The patient is nervous/anxious and has insomnia.   Blood pressure (!) 156/98, pulse 74, temperature 97.8 F (36.6 C), temperature source Oral, resp. rate 16, height 5\' 8"  (1.727 m), weight 70.3 kg, SpO2 100 %. Body mass index is 23.57 kg/m.  Treatment Plan Summary: Daily contact with patient to assess and evaluate symptoms and progress in treatment and Medication management.    Treatment Plan/Recommendations: 1. Admit for crisis management and stabilization,  estimated length of stay 3-5 days.  2. Medication management to reduce current symptoms to base line and improve the patient's overall level of functioning: See [facility] for plan of care.  Depression:  Re-initiated Wellbutrin XL 150 mg po daily starting today 10-26-20.  Agitation/severe anxiety: Continue the agitation protocols as recommended: Olanzapine Zydis 5 mg Q 8  hrs prn & Lorazepam 1 mg  po pen x 1 dose & Geodon 20 mg IM prn x 1 dose.   Anxiety. Continue Hydroxyzine 25 mg po tid prn.  Insomnia. Continue Trazodone 50 mg po Q hs prn.  Other medical issues: Continue'  Lipitor 80 mg po Q evening for high cholesterol. 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 HCTZ 25 mg po daily for HTN. Lisinopril 20 mg po daily for HTN. Tradjenta 5 mg po daily for DM. Lantus insulin 30 units daily in am. Thiamine 100 mg po daily for thiamine replacement. Sliding insulin coverage per BS result as recommended. CBG monitoring as recommended.  Other PRN medications: continue,  Acetaminophen 650 mg po Q 4 hrs prn for pain/fever. Mylanta 30 ml po Q 4 hrs prn for indigestion. MOM 30 ml po daily prn for constipation.  3. Treat health problems as indicated.  4. Develop treatment plan to decrease risk of relapse upon discharge and the need for readmission.  5. Psycho-social education regarding relapse prevention and self care.  6. Health care follow up as needed for medical problems.  7. Review, reconcile, and reinstate any pertinent home medications for other health issues where appropriate. 8. Call for consults with hospitalist for any additional specialty patient care services as needed.   Observation Level/Precautions:  15 minute checks  Laboratory:   Current lab reports from the ED reviewed.  Psychotherapy: Group sessions.  Medications: See MAR.  Consultations: Diabetes consult ordered.    Discharge Concerns: Safety, mood stability, maintaining sobriety.  Estimated LOS: 2-4  days.  Other: Admit to the 300-hall.   Physician Treatment Plan for Primary Diagnosis: MDD (major depressive disorder) Long Term Goal(s): Improvement in symptoms so as ready for discharge  Short Term Goals: Ability to identify changes in lifestyle to reduce recurrence of condition will improve, Ability to verbalize feelings will improve, Ability to disclose and discuss suicidal ideas, and Ability to demonstrate self-control will improve  Physician Treatment Plan for Secondary Diagnosis: Principal Problem:   MDD (major depressive disorder) Active Problems:   Cocaine use disorder, severe, dependence (HCC)  Long Term Goal(s): Improvement in symptoms so as ready for discharge  Short Term Goals: Ability to identify and develop effective coping behaviors will improve, Ability to maintain clinical measurements within normal limits will improve, Compliance with prescribed medications will improve, and Ability to identify triggers associated with substance abuse/mental health issues will improve  I certify that inpatient services furnished can reasonably be expected to improve the patient's condition.    Armandina Stammer, NPpmhnp, fnp-bc 8/17/20221:30 PM

## 2020-10-27 DIAGNOSIS — F332 Major depressive disorder, recurrent severe without psychotic features: Secondary | ICD-10-CM | POA: Diagnosis not present

## 2020-10-27 DIAGNOSIS — F1420 Cocaine dependence, uncomplicated: Secondary | ICD-10-CM | POA: Diagnosis not present

## 2020-10-27 LAB — GLUCOSE, CAPILLARY
Glucose-Capillary: 242 mg/dL — ABNORMAL HIGH (ref 70–99)
Glucose-Capillary: 203 mg/dL — ABNORMAL HIGH (ref 70–99)
Glucose-Capillary: 208 mg/dL — ABNORMAL HIGH (ref 70–99)
Glucose-Capillary: 127 mg/dL — ABNORMAL HIGH (ref 70–99)

## 2020-10-27 LAB — COMPREHENSIVE METABOLIC PANEL
ALT: 17 U/L (ref 0–44)
AST: 15 U/L (ref 15–41)
Albumin: 3.7 g/dL (ref 3.5–5.0)
Alkaline Phosphatase: 75 U/L (ref 38–126)
Anion gap: 10 (ref 5–15)
BUN: 19 mg/dL (ref 6–20)
CO2: 29 mmol/L (ref 22–32)
Calcium: 9.5 mg/dL (ref 8.9–10.3)
Chloride: 98 mmol/L (ref 98–111)
Creatinine, Ser: 1.37 mg/dL — ABNORMAL HIGH (ref 0.61–1.24)
GFR, Estimated: >60 mL/min (ref >60)
Glucose, Bld: 215 mg/dL — ABNORMAL HIGH (ref 70–99)
Potassium: 3.8 mmol/L (ref 3.5–5.1)
Sodium: 137 mmol/L (ref 135–145)
Total Bilirubin: 0.7 mg/dL (ref 0.3–1.2)
Total Protein: 6.8 g/dL (ref 6.5–8.1)

## 2020-10-27 LAB — CBC WITH DIFFERENTIAL/PLATELET
Abs Immature Granulocytes: 0.07 10*3/uL (ref 0.00–0.07)
Basophils Absolute: 0 10*3/uL (ref 0.0–0.1)
Basophils Relative: 0 %
Eosinophils Absolute: 0.1 10*3/uL (ref 0.0–0.5)
Eosinophils Relative: 2 %
HCT: 45.3 % (ref 39.0–52.0)
Hemoglobin: 15 g/dL (ref 13.0–17.0)
Immature Granulocytes: 1 %
Lymphocytes Relative: 42 %
Lymphs Abs: 3.4 10*3/uL (ref 0.7–4.0)
MCH: 28 pg (ref 26.0–34.0)
MCHC: 33.1 g/dL (ref 30.0–36.0)
MCV: 84.5 fL (ref 80.0–100.0)
Monocytes Absolute: 0.5 10*3/uL (ref 0.1–1.0)
Monocytes Relative: 7 %
Neutro Abs: 3.9 10*3/uL (ref 1.7–7.7)
Neutrophils Relative %: 48 %
Platelets: 143 10*3/uL — ABNORMAL LOW (ref 150–400)
RBC: 5.36 MIL/uL (ref 4.22–5.81)
RDW: 13 % (ref 11.5–15.5)
WBC: 8 10*3/uL (ref 4.0–10.5)
nRBC: 0 % (ref 0.0–0.2)

## 2020-10-27 MED ORDER — HYDROCHLOROTHIAZIDE 12.5 MG PO CAPS
12.5000 mg | ORAL_CAPSULE | Freq: Every day | ORAL | Status: DC
  Administered 2020-10-28: 12.5 mg via ORAL
  Filled 2020-10-27 (×3): qty 1

## 2020-10-27 NOTE — Progress Notes (Signed)
Pt A & O X3. Denies SI, HI, AVH and pain when assessed. Reports he slept well last night with good appetite, normal energy and poor concentration level. Rates his depression 7/10, hopelessness 6/10 and anxiety 8/10. Presents with bright affect, logical speech, fair eye contact on interactions and is ambulatory in milieu with a steady gait. Reports current stressors are related to "finding a place to live. 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 Pt remains compliant with medications and CBG monitoring without issues. Denies adverse drug reactions at this time.  Emotional support offered to pt this shift. Q 15 minutes safety checks maintained without self harm gestures. Pt encouraged to verbalized concerns, attend scheduled groups and comply with current treatment regimen. Fluids encouraged as ordered and new water pitcher offer to pt as well. Pt remains safe on and off unit. Tolerates fluids and meals well. Attended and engaged in scheduled groups when prompted.

## 2020-10-27 NOTE — Progress Notes (Signed)
[facility] Group Notes:  (Nursing/MHT/Case Management/Adjunct)  Date:  10/27/2020  Time:  2015  Type of Therapy:   wrap up group  Participation Level:  Active  Participation Quality:  Appropriate, Attentive, Sharing, and Supportive  Affect:  Appropriate  Cognitive:  Alert  Insight:  Good  Engagement in Group:  Engaged  Modes of Intervention:  Clarification, Education, and Support  Summary of Progress/Problems: Positive thinking and self-care were discussed. Pt wants to focus more on his self.  Marcille Buffy 10/27/2020, 8:56 PM

## 2020-10-27 NOTE — Progress Notes (Signed)
  D:  Pt presents with a high level of depression (8/10).  Pt appears sad.  Pt reports, "I'm just disappointed I didn't get to see my visitor."  Pt reports, "I'm not sure what happened."  Pt denies SI/HI, and verbally contracts for safety.  Pt denies AVH.  A:  Labs/Vitals monitored; Pt encouraged to communicate concerns.  R:  Pt remains safe on unit with Q 15 minute safety checks.     10/26/20 2115  Psych Admission Type (Psych Patients Only)  Admission Status Voluntary  Psychosocial Assessment  Patient Complaints Depression;Sadness  Eye Contact Fair  Facial Expression Pensive;Sad  Affect Appropriate to circumstance  Speech Logical/coherent  Interaction Assertive  Motor Activity Slow  Appearance/Hygiene Unremarkable  Behavior Characteristics Cooperative  Mood Pleasant;Depressed  Thought Process  Coherency WDL  Content WDL  Delusions None reported or observed  Perception WDL  Hallucination None reported or observed  Judgment Impaired  Confusion None  Danger to Self  Current suicidal ideation? Denies  Self-Injurious Behavior No self-injurious ideation or behavior indicators observed or expressed   Agreement Not to Harm Self Yes  Description of Agreement Verbal Contract  Danger to Others  Danger to Others None reported or observed

## 2020-10-27 NOTE — [facility] Group Notes (Signed)
Adult Psychoeducational Group Note  Date:  10/27/2020 Time:  11:46 AM  Group Topic/Focus:  Goals Group:   The focus of this group is to help patients establish daily goals to achieve during treatment and discuss how the patient can incorporate goal setting into their daily lives to aide in recovery.  Participation Level:  Active  Participation Quality:  Appropriate and Attentive  Affect:  Appropriate  Cognitive:  Appropriate  Insight: Good  Engagement in Group:  Engaged  Modes of Intervention:  Orientation  Additional Comments: Goal for today is to exercise  Azalee Course 10/27/2020, 11:46 AM

## 2020-10-27 NOTE — Progress Notes (Signed)
Troy Gordon Progress Note  10/27/2020 1:45 PM Troy Gordon  MRN:  [id number]  Subjective: Troy Gordon reports, "I'm actually feeling good today. I feel like I have more energy today & mood is on the amend. I have not felt like this in a long time. As for sleep, It was great last night.  The best sleep I have had in a very long time. I'm doing well on the medicines, no side effects. I have been to a nutritional group this morning, learned few good things to eat considering I have diabetes. I know now that proteins & vegetables are the best thing for me to keep my sugar within normal level". Daily notes: Troy Gordon is seen, chart reviewed. The chart findings discussed with the treatment team. He presents alert, oriented x 3 & aware of situation. He is visible on the unit, attending group sessions. He presents with an improved affect & a good eye contact. He remains verbally responsive. Troy Gordon reports that he is feeling good today. He says he feels like he has more energy today & his mood is on the amend. He reports had a good night sleep last night. He says he feels he benefited from the group sessions this morning. He says the group sessions was about having good nutrition while dealing with mental/medical health issues. And as a diabetic patient, he learned that having adequate protein & vegetables in his diet is the way to go to maintain a normal body weight & regulate his sugar. He currently denies any new issues or concerns. He denies any SIHI, AVH, delusional thoughts or paranoia. He does not appear to be responding to any internal stimuli. He denies any substance withdrawal symptoms. He is in agreement to continue current plan of care as already in progress. There are no changes made. Reviewed vital signs, stable: B/p: 136/81, P 98, R 16 & sat 100% in Rm air.  Reason for admission: Troy Gordon is a 55 year old male with a history of major depressive disorder, cocaine use disorder, polysubstance abuse (cocaine,  cannabis, alcohol), diabetes mellitus, hypertension, hyperlipidemia who presented on 10/24/2020 to [facility] ED for suicidal ideation with reported attempt to overdose on cocaine the night before and thoughts of harming others.  On evaluation with me today, patient is pleasant, cooperative, reports depressed anxious mood, displays congruent affect and organized coherent thought processes.  Principal Problem: Major depressive disorder, recurrent episode, moderate (HCC)  Diagnosis: Principal Problem:   Major depressive disorder, recurrent episode, moderate (HCC) Active Problems:   Cocaine use disorder, severe, dependence (HCC)  Total Time spent with patient:  25 minutes  Past Psychiatric History: See H&P  Past Medical History:  Past Medical History:  Diagnosis Date   Depression    Diabetes mellitus (HCC)    Dyslipidemia    Hyperlipidemia    Hypertension     Past Surgical History:  Procedure Laterality Date   CIRCUMCISION     as an adult   Family History:  Family History  Problem Relation Age of Onset   Diabetes Maternal Grandmother    Hypertension Maternal Grandmother    Hypertension Father    Family Psychiatric  History: See H&P  Social History:  Social History   Substance and Sexual Activity  Alcohol Use Yes   Comment: occasionally beer     Social History   Substance and Sexual Activity  Drug Use Yes   Types: Marijuana, Cocaine   Comment: last used crack 06/07/20    Social History   Socioeconomic  History   Marital status: Single    Spouse name: Not on file   Number of children: Not on file   Years of education: Not on file   Highest education level: Not on file  Occupational History   Not on file  Tobacco Use   Smoking status: Former    Years: 1.00    Types: Cigarettes   Smokeless tobacco: Never   Tobacco comments:    1 cig every 5 weeks  Vaping Use   Vaping Use: Never used  Substance and Sexual Activity   Alcohol use: Yes    Comment:  occasionally beer   Drug use: Yes    Types: Marijuana, Cocaine    Comment: last used crack 06/07/20   Sexual activity: Not on file  Other Topics Concern   Not on file  Social History Narrative   Not on file   Social Determinants of Health   Financial Resource Strain: Not on file  Food Insecurity: Not on file  Transportation Needs: Not on file  Physical Activity: Not on file  Stress: Not on file  Social Connections: Not on file   Additional Social History:    Sleep: Good (6.75 hours per documentation).  Appetite:  Good  Current Medications: Current Facility-Administered Medications  Medication Dose Route Frequency Provider Last Rate Last Admin   acetaminophen (TYLENOL) tablet 650 mg  650 mg Oral Q6H PRN Novella Oliveolby, Karen R, NP       alum & mag hydroxide-simeth (MAALOX/MYLANTA) 200-200-20 MG/5ML suspension 30 mL  30 mL Oral Q4H PRN Novella Oliveolby, Karen R, NP       atorvastatin (LIPITOR) tablet 80 mg  80 mg Oral Daily Lauralynn Loeb, Nicole KindredAgnes I, NP   80 mg at 10/27/20 0829   buPROPion (WELLBUTRIN XL) 24 hr tablet 150 mg  150 mg Oral Daily Armandina StammerNwoko, Charisse Wendell I, NP   150 mg at 10/27/20 [id number]   gabapentin (NEURONTIN) capsule 100 mg  100 mg Oral TID Armandina StammerNwoko, Trip Cavanagh I, NP   100 mg at 10/27/20 1251   hydrochlorothiazide (HYDRODIURIL) tablet 25 mg  25 mg Oral Daily Novella Oliveolby, Karen R, NP   25 mg at 10/27/20 [id number]   hydrOXYzine (ATARAX/VISTARIL) tablet 25 mg  25 mg Oral TID PRN Novella Oliveolby, Karen R, NP       insulin aspart (novoLOG) injection 0-15 Units  0-15 Units Subcutaneous TID WC Novella Oliveolby, Karen R, NP   5 Units at 10/27/20 1251   insulin glargine (LANTUS) injection 30 Units  30 Units Subcutaneous Daily Novella Oliveolby, Karen R, NP   30 Units at 10/27/20 0859   linagliptin (TRADJENTA) tablet 5 mg  5 mg Oral Daily Armandina StammerNwoko, Drako Maese I, NP   5 mg at 10/27/20 0829   lisinopril (ZESTRIL) tablet 20 mg  20 mg Oral Daily Novella Oliveolby, Karen R, NP   20 mg at 10/27/20 0829   OLANZapine zydis (ZYPREXA) disintegrating tablet 5 mg  5 mg Oral Q8H PRN Comer LocketSingleton, Amy E,  Gordon       And   LORazepam (ATIVAN) tablet 1 mg  1 mg Oral PRN Comer LocketSingleton, Amy E, Gordon       And   ziprasidone (GEODON) injection 20 mg  20 mg Intramuscular PRN Mason JimSingleton, Amy E, Gordon       magnesium hydroxide (MILK OF MAGNESIA) suspension 30 mL  30 mL Oral Daily PRN Novella Oliveolby, Karen R, NP       thiamine tablet 100 mg  100 mg Oral Daily Novella Oliveolby, Karen R, NP   100 mg at  10/27/20 0829   traZODone (DESYREL) tablet 50 mg  50 mg Oral QHS PRN Novella Olive, NP        Lab Results:  Results for orders placed or performed during the hospital encounter of 10/25/20 (from the past 48 hour(s))  Glucose, capillary     Status: Abnormal   Collection Time: 10/25/20  5:39 PM  Result Value Ref Range   Glucose-Capillary 322 (H) 70 - 99 mg/dL    Comment: Glucose reference range applies only to samples taken after fasting for at least 8 hours.  Glucose, capillary     Status: Abnormal   Collection Time: 10/25/20  9:13 PM  Result Value Ref Range   Glucose-Capillary 194 (H) 70 - 99 mg/dL    Comment: Glucose reference range applies only to samples taken after fasting for at least 8 hours.  Glucose, capillary     Status: Abnormal   Collection Time: 10/26/20  6:03 AM  Result Value Ref Range   Glucose-Capillary 248 (H) 70 - 99 mg/dL    Comment: Glucose reference range applies only to samples taken after fasting for at least 8 hours.   Comment 1 Notify RN    Comment 2 Document in Chart   Lipid panel     Status: Abnormal   Collection Time: 10/26/20  6:22 AM  Result Value Ref Range   Cholesterol 312 (H) 0 - 200 mg/dL   Triglycerides 841 (H) <150 mg/dL   HDL 48 >66 mg/dL   Total CHOL/HDL Ratio 6.5 RATIO   VLDL 42 (H) 0 - 40 mg/dL   LDL Cholesterol 063 (H) 0 - 99 mg/dL    Comment:        Total Cholesterol/HDL:CHD Risk Coronary Heart Disease Risk Table                     Men   Women  1/2 Average Risk   3.4   3.3  Average Risk       5.0   4.4  2 X Average Risk   9.6   7.1  3 X Average Risk  23.4   11.0        Use  the calculated Patient Ratio above and the CHD Risk Table to determine the patient's CHD Risk.        ATP III CLASSIFICATION (LDL):  <100     mg/dL   Optimal  016-010  mg/dL   Near or Above                    Optimal  130-159  mg/dL   Borderline  932-355  mg/dL   High  >732     mg/dL   Very High Performed at [facility], [address], [place], [place] [address]   TSH     Status: None   Collection Time: 10/26/20  6:22 AM  Result Value Ref Range   TSH 2.069 0.350 - 4.500 uIU/mL    Comment: Performed by a 3rd Generation assay with a functional sensitivity of <=0.01 uIU/mL. Performed at [facility], [address], [place], [place] [address]   Glucose, capillary     Status: Abnormal   Collection Time: 10/26/20 12:19 PM  Result Value Ref Range   Glucose-Capillary 292 (H) 70 - 99 mg/dL    Comment: Glucose reference range applies only to samples taken after fasting for at least 8 hours.  Glucose, capillary     Status: Abnormal  Collection Time: 10/26/20  5:00 PM  Result Value Ref Range   Glucose-Capillary 266 (H) 70 - 99 mg/dL    Comment: Glucose reference range applies only to samples taken after fasting for at least 8 hours.  Glucose, capillary     Status: Abnormal   Collection Time: 10/26/20  9:15 PM  Result Value Ref Range   Glucose-Capillary 135 (H) 70 - 99 mg/dL    Comment: Glucose reference range applies only to samples taken after fasting for at least 8 hours.   Comment 1 Notify RN    Comment 2 Document in Chart   Glucose, capillary     Status: Abnormal   Collection Time: 10/27/20  5:44 AM  Result Value Ref Range   Glucose-Capillary 242 (H) 70 - 99 mg/dL    Comment: Glucose reference range applies only to samples taken after fasting for at least 8 hours.   Comment 1 Notify RN   Comprehensive metabolic panel     Status: Abnormal   Collection Time: 10/27/20  6:29 AM  Result Value Ref Range   Sodium 137 135 - 145 mmol/L    Potassium 3.8 3.5 - 5.1 mmol/L   Chloride 98 98 - 111 mmol/L   CO2 29 22 - 32 mmol/L   Glucose, Bld 215 (H) 70 - 99 mg/dL    Comment: Glucose reference range applies only to samples taken after fasting for at least 8 hours.   BUN 19 6 - 20 mg/dL   Creatinine, Ser 1.19 (H) 0.61 - 1.24 mg/dL   Calcium 9.5 8.9 - 41.7 mg/dL   Total Protein 6.8 6.5 - 8.1 g/dL   Albumin 3.7 3.5 - 5.0 g/dL   AST 15 15 - 41 U/L   ALT 17 0 - 44 U/L   Alkaline Phosphatase 75 38 - 126 U/L   Total Bilirubin 0.7 0.3 - 1.2 mg/dL   GFR, Estimated >40 >81 mL/min    Comment: (NOTE) Calculated using the CKD-EPI Creatinine Equation (2021)    Anion gap 10 5 - 15    Comment: Performed at [facility], [address], [place], [place] [address]  CBC with Differential/Platelet     Status: Abnormal   Collection Time: 10/27/20  6:29 AM  Result Value Ref Range   WBC 8.0 4.0 - 10.5 K/uL   RBC 5.36 4.22 - 5.81 MIL/uL   Hemoglobin 15.0 13.0 - 17.0 g/dL   HCT 56.3 14.9 - 70.2 %   MCV 84.5 80.0 - 100.0 fL   MCH 28.0 26.0 - 34.0 pg   MCHC 33.1 30.0 - 36.0 g/dL   RDW 63.7 85.8 - 85.0 %   Platelets 143 (L) 150 - 400 K/uL   nRBC 0.0 0.0 - 0.2 %   Neutrophils Relative % 48 %   Neutro Abs 3.9 1.7 - 7.7 K/uL   Lymphocytes Relative 42 %   Lymphs Abs 3.4 0.7 - 4.0 K/uL   Monocytes Relative 7 %   Monocytes Absolute 0.5 0.1 - 1.0 K/uL   Eosinophils Relative 2 %   Eosinophils Absolute 0.1 0.0 - 0.5 K/uL   Basophils Relative 0 %   Basophils Absolute 0.0 0.0 - 0.1 K/uL   Immature Granulocytes 1 %   Abs Immature Granulocytes 0.07 0.00 - 0.07 K/uL    Comment: Performed at [facility], [address] [place], [place] [address]  Glucose, capillary     Status: Abnormal   Collection Time: 10/27/20 12:01 PM  Result Value  Ref Range   Glucose-Capillary 203 (H) 70 - 99 mg/dL    Comment: Glucose reference range applies only to samples taken after fasting for at least 8 hours.   Blood Alcohol  level:  Lab Results  Component Value Date   ETH <10 10/24/2020   ETH <10 06/07/2020   Metabolic Disorder Labs: Lab Results  Component Value Date   HGBA1C 11.3 (H) 10/24/2020   MPG 277.61 10/24/2020   MPG 257.52 06/07/2020   No results found for: PROLACTIN Lab Results  Component Value Date   CHOL 312 (H) 10/26/2020   TRIG 209 (H) 10/26/2020   HDL 48 10/26/2020   CHOLHDL 6.5 10/26/2020   VLDL 42 (H) 10/26/2020   LDLCALC 222 (H) 10/26/2020   LDLCALC 116 (H) 12/23/2019   Physical Findings: AIMS: Facial and Oral Movements Muscles of Facial Expression: None, normal Lips and Perioral Area: None, normal Jaw: None, normal Tongue: None, normal,Extremity Movements Upper (arms, wrists, hands, fingers): None, normal Lower (legs, knees, ankles, toes): None, normal, Trunk Movements Neck, shoulders, hips: None, normal, Overall Severity Severity of abnormal movements (highest score from questions above): None, normal Incapacitation due to abnormal movements: None, normal Patient's awareness of abnormal movements (rate only patient's report): No Awareness, Dental Status Current problems with teeth and/or dentures?: No Does patient usually wear dentures?: No  CIWA:    COWS:     Musculoskeletal: Strength & Muscle Tone: within normal limits Gait & Station: normal Patient leans: N/A  Psychiatric Specialty Exam:  Presentation  General Appearance: Disheveled  Eye Contact:Good  Speech:Clear and Coherent; Normal Rate  Speech Volume:Normal  Handedness:Right   Mood and Affect  Mood:Depressed; Anxious  Affect:Congruent  Thought Process  Thought Processes:Coherent; Goal Directed  Descriptions of Associations:Intact  Orientation:Full (Time, Place and Person)  Thought Content:Logical  History of Schizophrenia/Schizoaffective disorder:No  Duration of Psychotic Symptoms:N/A  Hallucinations:Hallucinations: None  Ideas of Reference:None  Suicidal Thoughts:Suicidal  Thoughts: Yes, Passive SI Passive Intent and/or Plan: Without Intent; Without Plan  Homicidal Thoughts:Homicidal Thoughts: No  Sensorium  Memory:Immediate Good; Recent Good  Judgment:Fair  Insight:Fair  Executive Functions  Concentration:Good  Attention Span:Good  Recall:Good  Fund of Knowledge:Fair  Language:Good  Psychomotor Activity  Psychomotor Activity:Psychomotor Activity: Normal  Assets  Assets:Communication Skills; Desire for Improvement; Housing; Resilience  Sleep  Sleep:Sleep: Fair Number of Hours of Sleep: 6.75  Physical Exam: Physical Exam Vitals and nursing note reviewed.  HENT:     Head: Normocephalic.     Nose: Nose normal.     Mouth/Throat:     Pharynx: Oropharynx is clear.  Eyes:     Pupils: Pupils are equal, round, and reactive to light.  Cardiovascular:     Rate and Rhythm: Normal rate.     Pulses: Normal pulses.     Comments: Hx: HTN Pulmonary:     Effort: Pulmonary effort is normal.  Genitourinary:    Comments: Deferred Musculoskeletal:        General: Normal range of motion.     Cervical back: Normal range of motion.  Skin:    General: Skin is warm and dry.  Neurological:     General: No focal deficit present.     Mental Status: He is alert and oriented to person, place, and time.   Review of Systems  Constitutional:  Negative for chills, diaphoresis and fever.  HENT: Negative.    Eyes: Negative.   Respiratory:  Negative for cough, shortness of breath and wheezing.   Cardiovascular:  Negative for chest pain  and palpitations.  Gastrointestinal:  Negative for abdominal pain, constipation, diarrhea, heartburn, nausea and vomiting.  Genitourinary:  Negative for dysuria.  Musculoskeletal:  Negative for joint pain and myalgias.  Skin: Negative.   Neurological:  Negative for dizziness, tingling, tremors, sensory change, speech change, focal weakness, seizures, loss of consciousness, weakness and headaches.  Endo/Heme/Allergies:         Allergies: NKDA  Psychiatric/Behavioral:  Positive for depression and substance abuse (Hx. alcohol/THC/cocaine use disorder.). Negative for hallucinations, memory loss and suicidal ideas. The patient is not nervous/anxious and does not have insomnia.   Blood pressure 136/81, pulse 70, temperature 98 F (36.7 C), temperature source Oral, resp. rate 16, height 5\' 8"  (1.727 m), weight 70.3 kg, SpO2 100 %. Body mass index is 23.57 kg/m.  Treatment Plan Summary: Daily contact with patient to assess and evaluate symptoms and progress in treatment and Medication management.   Continue inpatient hospitalization.  Will continue today 10/27/2020 plan as below except where it is noted.   Depression:  Continue Wellbutrin XL 150 mg po daily started on 10-26-20.   Agitation/severe anxiety: Continue the agitation protocols as recommended: Olanzapine Zydis 5 mg Q 8 hrs prn  & Lorazepam 1 mg  po pen x 1 dose  & Geodon 20 mg IM prn x 1 dose.    Anxiety. Continue Hydroxyzine 25 mg po tid prn.   Insomnia. Continue Trazodone 50 mg po Q hs prn.   Other medical issues: Continue,  Lipitor 80 mg po Q evening for high cholesterol. 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 HCTZ 25 mg po daily for HTN. Lisinopril 20 mg po daily for HTN. Tradjenta 5 mg po daily for DM. Lantus insulin 30 units daily in am. Thiamine 100 mg po daily for thiamine replacement. Sliding insulin coverage as recommended per CBG result CBG monitoring as recommended.   Other PRN medications: continue,  Acetaminophen 650 mg po Q 4 hrs prn for pain/fever. Mylanta 30 ml po Q 4 hrs prn for indigestion. MOM 30 ml po daily prn for constipation.  Encourage group attendance/participation to learn coping skills. Discharge disposition plan id ongoing.  10-28-20, NP, pmhnp, fnp-bc 10/27/2020, 1:45 PM

## 2020-10-27 NOTE — [facility] Group Notes (Signed)
Psychoeducational Group- Identifying Negative Behavioral Patterns. In this group, Clinical research associate read the Poem ''There's a hole in the sidewalk'' By [employer]. Each Person was asked to identify how this applies to their own mental health. Pt was able to identify triggers which lead to hospitalization, and identify negative patterns in which pt engages in. Pt was attentative and appropriate. Pt freely shared that '' when I get angry I do crazy stuff, and so I've learned to walk away''

## 2020-10-27 NOTE — Plan of Care (Signed)
  Problem: Education: Goal: Emotional status will improve Outcome: Not Progressing Goal: Mental status will improve Outcome: Not Progressing Goal: Verbalization of understanding the information provided will improve Outcome: Not Progressing   

## 2020-10-28 DIAGNOSIS — F1420 Cocaine dependence, uncomplicated: Secondary | ICD-10-CM | POA: Diagnosis not present

## 2020-10-28 DIAGNOSIS — F332 Major depressive disorder, recurrent severe without psychotic features: Secondary | ICD-10-CM | POA: Diagnosis not present

## 2020-10-28 LAB — GLUCOSE, CAPILLARY: Glucose-Capillary: 175 mg/dL — ABNORMAL HIGH (ref 70–99)

## 2020-10-28 MED ORDER — INSULIN GLARGINE 100 UNIT/ML ~~LOC~~ SOLN: 30.0000 [IU] | Freq: Every day | SUBCUTANEOUS | 0 refills | Status: DC

## 2020-10-28 MED ORDER — ATORVASTATIN CALCIUM 80 MG PO TABS: 80.0000 mg | ORAL_TABLET | Freq: Every day | ORAL | 0 refills | Status: DC

## 2020-10-28 MED ORDER — GABAPENTIN 100 MG PO CAPS: 100.0000 mg | ORAL_CAPSULE | Freq: Three times a day (TID) | ORAL | 0 refills | Status: DC

## 2020-10-28 MED ORDER — HYDROXYZINE HCL 25 MG PO TABS: 25.0000 mg | ORAL_TABLET | Freq: Three times a day (TID) | ORAL | 0 refills | Status: DC | PRN

## 2020-10-28 MED ORDER — TRAZODONE HCL 50 MG PO TABS: 50.0000 mg | ORAL_TABLET | Freq: Every evening | ORAL | 0 refills | Status: DC | PRN

## 2020-10-28 MED ORDER — BUPROPION HCL ER (XL) 150 MG PO TB24: 150.0000 mg | ORAL_TABLET | Freq: Every day | ORAL | 0 refills | Status: DC

## 2020-10-28 MED ORDER — LISINOPRIL-HYDROCHLOROTHIAZIDE 20-25 MG PO TABS: 1.0000 | ORAL_TABLET | Freq: Every day | ORAL | 0 refills | Status: DC

## 2020-10-28 MED ORDER — LINAGLIPTIN 5 MG PO TABS: 5.0000 mg | ORAL_TABLET | Freq: Every day | ORAL | 0 refills | Status: DC

## 2020-10-28 MED ORDER — HYDROCHLOROTHIAZIDE 25 MG PO TABS: 25.0000 mg | ORAL_TABLET | Freq: Every day | ORAL | Status: DC

## 2020-10-28 NOTE — Progress Notes (Signed)
Recreation Therapy Notes  Date: 8.19.22 Time: 0930 Location: [address] Dayroom  Group Topic: Stress Management   Goal Area(s) Addresses:  Patient will actively participate in stress management techniques presented during session.  Patient will successfully identify benefit of practicing stress management post d/c.   Behavioral Response: Appropriate  Intervention: Relaxation exercise with ambient sound and script   Activity: Guided Imagery. LRT provided education, instruction, and demonstration on practice of visualization via guided imagery. Patient was asked to participate in the technique introduced during session. Patients were given suggestions of ways to access scripts post d/c and encouraged to explore Youtube and other apps available on smartphones, tablets, and computers.  Education:  Stress Management, Discharge Planning.   Education Outcome: Acknowledges education  Clinical Observations/Feedback: Patient actively engaged in technique introduced and expressed no concerns.      Caroll Rancher, LRT/CTRS         Caroll Rancher A 10/28/2020 12:26 PM

## 2020-10-28 NOTE — Plan of Care (Signed)
Nurse discussed anxiety, depression and coping skills with patient.  

## 2020-10-28 NOTE — Progress Notes (Signed)
Discharge Note:  Patient discharged home with friend.  Denied SI and HI.  Denied A/V hallucinations.  Suicide prevention information given and discussed with patient who stated he understood and had no questions.  Patient stated he appreciated all assistance from [facility] staff.  Patient stated he received all his belongings, clothing, toiletries, misc items, etc.

## 2020-10-28 NOTE — [facility] Suicide Risk Assessment (Signed)
[facility] Discharge Suicide Risk Assessment   Principal Problem: Major depressive disorder, recurrent episode, moderate (HCC) Discharge Diagnoses: Principal Problem:   Major depressive disorder, recurrent episode, moderate (HCC) Active Problems:   Cocaine use disorder, severe, dependence (HCC)   Total Time spent with patient: 20 minutes  Musculoskeletal: Strength & Muscle Tone: within normal limits Gait & Station: normal Patient leans: N/A  Psychiatric Specialty Exam  Presentation  General Appearance: Casual  Eye Contact:Good  Speech:Clear and Coherent; Normal Rate  Speech Volume:Normal  Handedness:Right   Mood and Affect  Mood:Euthymic  Duration of Depression Symptoms: Greater than two weeks  Affect:Appropriate   Thought Process  Thought Processes:Coherent; Goal Directed  Descriptions of Associations:Intact  Orientation:Full (Time, Place and Person)  Thought Content:Logical; WDL  History of Schizophrenia/Schizoaffective disorder:No  Duration of Psychotic Symptoms:N/A  Hallucinations:Hallucinations: None  Ideas of Reference:None  Suicidal Thoughts:Suicidal Thoughts: No  Homicidal Thoughts:Homicidal Thoughts: No   Sensorium  Memory:Immediate Good; Recent Good  Judgment:Good  Insight:Good   Executive Functions  Concentration:Good  Attention Span:Good  Recall:Good  Fund of Knowledge:Good  Language:Good   Psychomotor Activity  Psychomotor Activity:Psychomotor Activity: Normal   Assets  Assets:Communication Skills; Desire for Improvement; Housing; Resilience; Social Support; Vocational/Educational; Transportation   Sleep  Sleep:Sleep: Good Number of Hours of Sleep: 6.75   Physical Exam: Physical Exam Vitals and nursing note reviewed.  Constitutional:      General: He is not in acute distress.    Appearance: Normal appearance. He is not diaphoretic.  HENT:     Head: Normocephalic and atraumatic.  Cardiovascular:     Rate and  Rhythm: Normal rate.  Pulmonary:     Effort: Pulmonary effort is normal.  Neurological:     General: No focal deficit present.     Mental Status: He is alert and oriented to person, place, and time.   Review of Systems  Constitutional: Negative.   HENT: Negative.    Respiratory: Negative.    Cardiovascular: Negative.   Gastrointestinal: Negative.   Musculoskeletal: Negative.   Skin: Negative.   Neurological: Negative.   Psychiatric/Behavioral:  Negative for hallucinations and suicidal ideas. The patient is not nervous/anxious and does not have insomnia.   All other systems reviewed and are negative. Blood pressure (!) 156/97, pulse 73, temperature 98 F (36.7 C), temperature source Oral, resp. rate 18, height 5\' 8"  (1.727 m), weight 70.3 kg, SpO2 100 %. Body mass index is 23.57 kg/m.  Mental Status Per Nursing Assessment::   On Admission:  Self-harm thoughts  Demographic Factors:  Male  Loss Factors: Financial problems/change in socioeconomic status  Historical Factors: Prior suicide attempts, Family history of mental illness or substance abuse, and Impulsivity  Risk Reduction Factors:   Employed, Living with another person, especially a relative, Positive social support, and Positive coping skills or problem solving skills  Continued Clinical Symptoms:  Depression -  improved Alcohol/Substance Abuse/Dependencies  Cognitive Features That Contribute To Risk:  None    Suicide Risk:  Minimal acute risk: No identifiable suicidal ideation.  Patients presenting with no risk factors but with morbid ruminations; may be classified as minimal risk based on the severity of the depressive symptoms   Follow-up Information     Services, [facility]. Go on 11/02/2020.   Why: You have a hospital follow up appointment for substance abuse intensive outpatient therapy and medication management services on 11/02/20 at 9:00 am.  This appointment will be held in person.  Please bring  photo ID, insurance card or proof of  any income, and social security card (if available). Contact information: [address] [place] [address] [phone]         [facility] Follow up.   Why: You may call this facility if you are interested in a halfway house. While living at this facility you are provided substance use treatment, medication management, therapy and transportation. Contact information: [address], [place]  [address] Phone: [phone] Fax: [phone]        [facility] Follow up.   Why: You may go to this facility for primary care services. Contact information: [address] [place], [place] [address] [phone]        [facility] Follow up.   Why: You may go to this facility for essential medical and pharmacy care at no/low cost. Please follow up at this facility for your medical needs. Contact information: [address] [place], [place] [address] [phone]                Plan Of Care/Follow-up recommendations:  Activity:  as tolerated  Tests:  During this hospitalization your lab work revealed mildly elevated creatinine of 1.37.  Your estimated GFR was WNL.  Please see your primary care provider for additional lab work to monitor your creatinine and for any indicated further work-up or treatment.  Other:   -Take medications as prescribed.   -Do not drink alcohol.  Do not use marijuana/cannabis or other drugs.   -Attend substance abuse treatment program.   -Keep outpatient mental health follow-up appointments with therapist and psychiatrist.   -See your primary care provider for assessment and treatment of elevated creatinine/abnormal kidney function and for management of diabetes, elevated cholesterol, high blood pressure and other medical problems.     Claudie Revering, MD 10/28/2020, 11:15 AM

## 2020-10-28 NOTE — Progress Notes (Addendum)
D:  Patient's self inventory sheet, patient sleeps good, sleep medication helpful.  Good appetite, normal energy level, poor concentration.  Rated depression 7, hopeless 6, anxiety #8.  Withdrawals, diarrhea.  Denied SI.  Denied physical problems.  Denied physical pain.  Goal is exercise.  Plans to take care of himself.  No discharge plans. A:  Medications administered per MD orders.  Emotional support and encouragement given patient. R   Denied SI and HI, contracts for safety.  Denied A/V hallucinations.  Safety maintained with 15 minute checks.

## 2020-10-28 NOTE — Discharge Summary (Signed)
Physician Discharge Summary Note  Patient:  Troy Gordon is an 55 y.o., male MRN:  [id number] DOB:  [birth year]  Patient phone:  [phone] (home)   Patient address:   [address] [place] [address],   Total Time spent with patient:  Greater than 30 minutes  Date of Admission:  10/25/2020 Date of Discharge: 10-28-20  Reason for Admission: Worsening suicidal ideation with reported attempt to overdose on cocaine the night before and thoughts of harming others.  Principal Problem: Major depressive disorder, recurrent episode, moderate (HCC)  Discharge Diagnoses: Principal Problem:   Major depressive disorder, recurrent episode, moderate (HCC) Active Problems:   Cocaine use disorder, severe, dependence (HCC)  Past Psychiatric History: Major depressive disorder, Cocaine use disorder.  Past Medical History:  Past Medical History:  Diagnosis Date   Depression    Diabetes mellitus (HCC)    Dyslipidemia    Hyperlipidemia    Hypertension     Past Surgical History:  Procedure Laterality Date   CIRCUMCISION     as an adult   Family History:  Family History  Problem Relation Age of Onset   Diabetes Maternal Grandmother    Hypertension Maternal Grandmother    Hypertension Father    Family Psychiatric  History: See H&P.  Social History:  Social History   Substance and Sexual Activity  Alcohol Use Yes   Comment: occasionally beer     Social History   Substance and Sexual Activity  Drug Use Yes   Types: Marijuana, Cocaine   Comment: last used crack 06/07/20    Social History   Socioeconomic History   Marital status: Single    Spouse name: Not on file   Number of children: Not on file   Years of education: Not on file   Highest education level: Not on file  Occupational History   Not on file  Tobacco Use   Smoking status: Former    Years: 1.00    Types: Cigarettes   Smokeless tobacco: Never   Tobacco comments:    1 cig every 5 weeks  Vaping Use    Vaping Use: Never used  Substance and Sexual Activity   Alcohol use: Yes    Comment: occasionally beer   Drug use: Yes    Types: Marijuana, Cocaine    Comment: last used crack 06/07/20   Sexual activity: Not on file  Other Topics Concern   Not on file  Social History Narrative   Not on file   Social Determinants of Health   Financial Resource Strain: Not on file  Food Insecurity: Not on file  Transportation Needs: Not on file  Physical Activity: Not on file  Stress: Not on file  Social Connections: Not on file   Hospital Course: (Per Md's admission evaluation notes): Troy Gordon is a 55 year old male with a history of major depressive disorder, cocaine use disorder, polysubstance abuse (cocaine, cannabis, alcohol), diabetes mellitus, hypertension, hyperlipidemia who presented on 10/24/2020 to [facility] ED for suicidal ideation with reported attempt to overdose on cocaine the night before and thoughts of harming others.  On evaluation with me today, patient is pleasant, cooperative, reports depressed anxious mood, displays congruent affect and organized coherent thought processes.  There is no tremor or other motor abnormality observed on examination.  Patient reports feeling depressed for a period of months and describes symptoms of depressed mood, trouble sleeping, decreased energy, problems concentrating, hopelessness, anxiety, intermittent suicidal ideation and recent suicide attempt via overdose on cocaine just prior to  the current admission.  Patient states he also had thoughts of harming others prior to presentation but denies AI or HI currently.  He denies AH or VH but states that he experienced AH of hearing his name called and VH of "images in the sky" while he was under the influence of cocaine with the most recent hallucinations occurring on 10/23/2020.  Per chart review, patient presented to [facility] ED in March 2022 reporting suicidal ideation and was admitted to [facility] in [facility].  Patient cannot recall any details regarding the hospital admission.  It appears from chart review that he was prescribed Wellbutrin XL  daily in the spring for depression but patient states that he has not been taking it.  He also reports that he has not been taking prescribed medications for his diabetes, hypertension or hyperlipidemia prior to admission.  Patient reports intermittent use of crack cocaine and states he increased his use beginning 10/20/2020 with an attempt to overdose on cocaine to end his life.  His most recent cocaine use was on 10/23/2020.  Patient consumes two 40 ounce containers of beer per day.  He denies any history of withdrawal symptoms, DTs or seizures.  He denies any current symptoms of alcohol withdrawal.  Patient also reports infrequent use of marijuana.  He denies use of other drugs and denies tobacco or nicotine use.  Patient denies any history of seizures and is receptive to starting treatment today with Wellbutrin for depression. Patient states that his mother may have had mental health issues but he does not know the details.  He reports some maternal relatives had issues with alcohol.  He thinks his father may have had some drug issues.  He denies any known family history of suicide attempt.   Prior to this discharge, Troy Gordon was seen & evaluated for mental health stability. The current laboratory findings were reviewed (stable). The nurses notes & vital signs were reviewed as well. There are no current mental health or medical issues that should prevent this discharge at this time. Patient is being discharged to continue mental health care/medication management as noted below.  This is the first psychiatric admission/discharge summary from this [facility] for this 55 year old [place]. He is with hx of major depressive disorder, alcohol use disorder & cocaine use disorder. He was brought to the hospital for worsening suicidal ideation with  reported attempt to overdose on cocaine the night before and thoughts of harming others. His UDS on admission was positive for cocaine. He admitted to increased alcohol & cannabis abuse as well. However, his BAL on admission was <10. Diem also presented with uncontrolled diabetic condition & HTN. He was in need of both psychiatric & medical treatments.   After evaluation of his presenting symptoms, it was jointly agreed by the treatment team to recommend [facility] for mood stabilization treatments.The medication regimen targeting those presenting symptoms were discussed & initiated with his consent. He received, stabilized & discharged on the medications as listed below on his discharge medication lists. He was also enrolled & participated in the group counseling sessions being offered & held on this unit. He learned coping skills. He presented other significant pre-existing medical issues that required treatments & or monitoring. He was resumed & discharged on all his pertinent home medications for those health issues. He tolerated his treatment regimen without any adverse effects or reactions reported.   Troy Gordon's symptoms responded well to his treatment regimen warranting this discharge. This is evidenced by his  reports of improved mood, absence of suicidal ideations, substance withdrawal symptoms & readiness to be discharged to his home. He is currently mentally & medically stable for discharge to continue mental health care as recommended below. He was also encouraged to enroll in a substance abuse treatment program to help with increased substance use. He declined. Troy Gordon is an established patient in the [facility] health department where he was receiving all his medical care & medication management. He is instructed & encouraged to follow-up with his primary care provider to about his kidney functions as his creatinine levels has remained elevated since March of this 2022. Patient is in agreement.    Today upon this discharge evaluation with the attending psychiatrist today, Troy Gordon reports he is doing & feeling a lot better than when first admitted to the hospital. He denies any specific concerns. He is sleeping well. His appetite is good. He denies other physical complaints. He denies SI/HI/AH/VH, delusional thoughts or paranoia. He does not appear to be responding to any internal stimuli. He is tolerating his medications well & in agreement to continue his current regimen as recommended. He will follow up for routine psychiatric care, further substance abuse treatment, medical care & medication management as noted below. He is provided with all the necessary information needed to make these appointments without problems. He was able to engage in safety planning including plan to return to [facility] or contact emergency services if he feels unable to maintain his own safety or the safety of others. Pt had no further questions, comments or concerns. He left [facility] with all personal belongings in no apparent distress. Transportation per his friend..     Physical Findings: AIMS: Facial and Oral Movements Muscles of Facial Expression: None, normal Lips and Perioral Area: None, normal Jaw: None, normal Tongue: None, normal,Extremity Movements Upper (arms, wrists, hands, fingers): None, normal Lower (legs, knees, ankles, toes): None, normal, Trunk Movements Neck, shoulders, hips: None, normal, Overall Severity Severity of abnormal movements (highest score from questions above): None, normal Incapacitation due to abnormal movements: None, normal Patient's awareness of abnormal movements (rate only patient's report): No Awareness, Dental Status Current problems with teeth and/or dentures?: No Does patient usually wear dentures?: No  CIWA:    COWS:     Musculoskeletal: Strength & Muscle Tone: within normal limits Gait & Station: normal Patient leans: N/A  Psychiatric Specialty Exam:  Presentation   General Appearance: Casual  Eye Contact:Good  Speech:Clear and Coherent; Normal Rate  Speech Volume:Normal  Handedness:Right  Mood and Affect  Mood:Euthymic  Affect:Appropriate  Thought Process  Thought Processes:Coherent; Goal Directed  Descriptions of Associations:Intact  Orientation:Full (Time, Place and Person)  Thought Content:Logical; WDL  History of Schizophrenia/Schizoaffective disorder:No  Duration of Psychotic Symptoms:N/A  Hallucinations:Hallucinations: None  Ideas of Reference:None  Suicidal Thoughts:Suicidal Thoughts: No  Homicidal Thoughts:Homicidal Thoughts: No  Sensorium  Memory:Immediate Good; Recent Good  Judgment:Good  Insight:Good  Executive Functions  Concentration:Good  Attention Span:Good  Recall:Good  Fund of Knowledge:Good  Language:Good  Psychomotor Activity  Psychomotor Activity:Psychomotor Activity: Normal  Assets  Assets:Communication Skills; Desire for Improvement; Housing; Resilience; Social Support; Vocational/Educational; Transportation  Sleep  Sleep:Sleep: Good Number of Hours of Sleep: 6.75  Physical Exam: Physical Exam Vitals and nursing note reviewed.  HENT:     Head: Normocephalic.     Nose: Nose normal.     Mouth/Throat:     Pharynx: Oropharynx is clear.  Eyes:     Pupils: Pupils are equal, round, and reactive to light.  Cardiovascular:     Rate and Rhythm: Normal rate.     Pulses: Normal pulses.     Comments: Hx: HTN, Hypercholesterolemia, DM. Pulmonary:     Effort: Pulmonary effort is normal.  Genitourinary:    Comments: Deferred Musculoskeletal:        General: Normal range of motion.     Cervical back: Normal range of motion.  Skin:    General: Skin is warm and dry.  Neurological:     General: No focal deficit present.     Mental Status: He is alert and oriented to person, place, and time. Mental status is at baseline.   Review of Systems  Constitutional:  Negative for chills,  diaphoresis and fever.  HENT:  Negative for congestion and sore throat.   Eyes: Negative.   Respiratory:  Negative for cough, shortness of breath and wheezing.   Cardiovascular:  Negative for chest pain and palpitations.  Gastrointestinal:  Negative for abdominal pain, constipation, diarrhea, heartburn, nausea and vomiting.  Genitourinary:  Negative for dysuria.  Musculoskeletal:  Negative for joint pain and myalgias.  Skin: Negative.   Neurological:  Negative for dizziness, tingling, tremors, sensory change, speech change, focal weakness, seizures, loss of consciousness, weakness and headaches.  Endo/Heme/Allergies:  Negative for environmental allergies and polydipsia. Does not bruise/bleed easily.       Allergies: NKDA.   Hx. Diabetes mellitus.  Psychiatric/Behavioral:  Positive for depression (Hx. of (Stable on medication).) and substance abuse (Hx. alcohol/cocaine use disorder (stable)). Negative for hallucinations, memory loss and suicidal ideas. The patient has insomnia (Hx. of (stable on medication)). The patient is not nervous/anxious (Stable upon discharge).   Blood pressure (!) 156/97, pulse 73, temperature 98 F (36.7 C), temperature source Oral, resp. rate 18, height  (1.727 m), weight 70.3 kg, SpO2 100 %. Body mass index is 23.57 kg/m.   Social History   Tobacco Use  Smoking Status Former   Years: 1.00   Types: Cigarettes  Smokeless Tobacco Never  Tobacco Comments   1 cig every 5 weeks   Tobacco Cessation:  N/A, patient does not currently use tobacco products  Blood Alcohol level:  Lab Results  Component Value Date   ETH <10 10/24/2020   ETH <10 06/07/2020   Metabolic Disorder Labs:  Lab Results  Component Value Date   HGBA1C 11.3 (H) 10/24/2020   MPG 277.61 10/24/2020   MPG 257.52 06/07/2020   No results found for: PROLACTIN Lab Results  Component Value Date   CHOL 312 (H) 10/26/2020   TRIG 209 (H) 10/26/2020   HDL 48 10/26/2020   CHOLHDL 6.5  10/26/2020   VLDL 42 (H) 10/26/2020   LDLCALC 222 (H) 10/26/2020   LDLCALC 116 (H) 12/23/2019    See Psychiatric Specialty Exam and Suicide Risk Assessment completed by Attending Physician prior to discharge.  Discharge destination:  Home  Is patient on multiple antipsychotic therapies at discharge:  No   Has Patient had three or more failed trials of antipsychotic monotherapy by history:  No  Recommended Plan for Multiple Antipsychotic Therapies: NA  Allergies as of 10/28/2020   No Known Allergies      Medication List     STOP taking these medications    Lantus SoloStar 100 UNIT/ML Solostar Pen Generic drug: insulin glargine Replaced by: insulin glargine 100 UNIT/ML injection   sildenafil 100 MG tablet Commonly known as: VIAGRA   sitaGLIPtin 100 MG tablet Commonly known as: Januvia       TAKE these  medications      Indication  atorvastatin 80 MG tablet Commonly known as: LIPITOR Take 1 tablet (80 mg total) by mouth daily. For high cholesterol What changed: additional instructions  Indication: High Amount of Triglycerides in the Blood, Elevation of Both Cholesterol and Triglycerides in Blood   buPROPion 150 MG 24 hr tablet Commonly known as: Wellbutrin XL Take 1 tablet (150 mg total) by mouth daily. For depression What changed: additional instructions  Indication: Major Depressive Disorder   gabapentin 100 MG capsule Commonly known as: NEURONTIN Take 1 capsule (100 mg total) by mouth 3 (three) times daily. For neuropathic pain  Indication: Diabetes with Nerve Disease, Substance withdrawal syndrome.   hydrOXYzine 25 MG tablet Commonly known as: ATARAX/VISTARIL Take 1 tablet (25 mg total) by mouth 3 (three) times daily as needed for anxiety.  Indication: Feeling Anxious   insulin glargine 100 UNIT/ML injection Commonly known as: LANTUS Inject 0.3 mLs (30 Units total) into the skin daily. For diabetes management Start taking on: October 29, 2020 Replaces: Lantus SoloStar 100 UNIT/ML Solostar Pen  Indication: Type 2 Diabetes   linagliptin 5 MG Tabs tablet Commonly known as: TRADJENTA Take 1 tablet (5 mg total) by mouth daily. For diabetes management Start taking on: October 29, 2020  Indication: Type 2 Diabetes, Diabtes management   lisinopril-hydrochlorothiazide 20-25 MG tablet Commonly known as: ZESTORETIC Take 1 tablet by mouth daily. For high blood pressure What changed: additional instructions  Indication: High Blood Pressure Disorder   traZODone 50 MG tablet Commonly known as: DESYREL Take 1 tablet (50 mg total) by mouth at bedtime as needed for sleep.  Indication: Trouble Sleeping        Follow-up Information     Services, [facility]. Go on 11/02/2020.   Why: You have a hospital follow up appointment for substance abuse intensive outpatient therapy and medication management services on 11/02/20 at 9:00 am.  This appointment will be held in person.  Please bring photo ID, insurance card or proof of any income, and social security card (if available). Contact information: [address] [place] [address] [phone]         [facility] Follow up.   Why: You may call this facility if you are interested in a halfway house. While living at this facility you are provided substance use treatment, medication management, therapy and transportation. Contact information: [address] [place], [place]  [address] Phone: [phone] Fax: [phone]        [facility] Follow up.   Why: You may go to this facility for primary care services. Contact information: [address], [place], [place] [address] [phone]        [facility] Follow up.   Why: You may go to this facility for essential medical and pharmacy care at no/low cost. Please follow up at this facility for your medical needs. Contact information: [address], [place], [place] [address] [phone]               Follow-up recommendations: Activity:  As tolerated Diet: As recommended by your primary care doctor. Keep all scheduled follow-up appointments as recommended.    Comments: Prescriptions given at discharge.  Patient agreeable to plan.  Given opportunity to ask questions.  Appears to feel comfortable with discharge denies any current suicidal or homicidal thought. Patient is also instructed prior to discharge to: Take all medications as prescribed by his/her mental healthcare provider. Report any adverse effects  and or reactions from the medicines to his/her outpatient provider promptly. Patient has been instructed & cautioned: To not engage in alcohol and or illegal drug use while on prescription medicines. In the event of worsening symptoms, patient is instructed to call the crisis hotline, 911 and or go to the nearest ED for appropriate evaluation and treatment of symptoms. To follow-up with his/her primary care provider for your other medical issues, concerns and or health care needs.   Signed: Armandina Stammer, NP, pmhnp, fnp-bc 10/28/2020, 4:58 PM

## 2020-10-28 NOTE — Progress Notes (Signed)
   10/28/20 0400  Psych Admission Type (Psych Patients Only)  Admission Status Voluntary  Psychosocial Assessment  Patient Complaints Depression  Eye Contact Fair  Facial Expression Worried (congruent affect)  Affect Appropriate to circumstance  Speech Logical/coherent  Interaction Assertive  Motor Activity Slow  Appearance/Hygiene Unremarkable  Behavior Characteristics Cooperative  Mood Depressed;Pleasant  Thought Process  Coherency WDL  Content WDL  Delusions None reported or observed  Perception WDL  Hallucination None reported or observed  Judgment Limited  Confusion None  Danger to Self  Current suicidal ideation? Denies  Self-Injurious Behavior No self-injurious ideation or behavior indicators observed or expressed   Agreement Not to Harm Self Yes  Description of Agreement Verbal Contract  Danger to Others  Danger to Others None reported or observed

## 2020-10-28 NOTE — [facility] Suicide Risk Assessment (Addendum)
[facility] INPATIENT:  Family/Significant Other Suicide Prevention Education  Suicide Prevention Education:  Contact Attempts: Wenda Overland (girlfriend) [phone], (name of family member/significant other) has been identified by the patient as the family member/significant other with whom the patient will be residing, and identified as the person(s) who will aid the patient in the event of a mental health crisis.  With written consent from the patient, two attempts were made to provide suicide prevention education, prior to and/or following the patient's discharge.  We were unsuccessful in providing suicide prevention education.  A suicide education pamphlet was given to the patient to share with family/significant other.  Date and time of first attempt:10/28/20  /    9:56am  CSW left a HIPAA compliant message.   CSW was able to make contact with Wenda Overland. "He has anger and drug problems. He has never been physical with me though. He can live with me when he discharges me." No weapons in home. No safety concerns.   Felizardo Hoffmann 10/28/2020, 10:17 AM

## 2021-03-11 ENCOUNTER — Other Ambulatory Visit: Payer: Self-pay

## 2021-03-11 ENCOUNTER — Encounter (HOSPITAL_COMMUNITY): Payer: Self-pay | Admitting: Emergency Medicine

## 2021-03-11 ENCOUNTER — Emergency Department (HOSPITAL_COMMUNITY)
Admission: EM | Admit: 2021-03-11 | Discharge: 2021-03-12 | Disposition: A | Discharge status: Other Acute Inpatient Hospital | Payer: Medicaid Other | Attending: Emergency Medicine | Admitting: Emergency Medicine

## 2021-03-11 DIAGNOSIS — Z79899 Other long term (current) drug therapy: Secondary | ICD-10-CM | POA: Insufficient documentation

## 2021-03-11 DIAGNOSIS — F1914 Other psychoactive substance abuse with psychoactive substance-induced mood disorder: Secondary | ICD-10-CM | POA: Insufficient documentation

## 2021-03-11 DIAGNOSIS — I10 Essential (primary) hypertension: Secondary | ICD-10-CM | POA: Insufficient documentation

## 2021-03-11 DIAGNOSIS — E1169 Type 2 diabetes mellitus with other specified complication: Secondary | ICD-10-CM | POA: Insufficient documentation

## 2021-03-11 DIAGNOSIS — R45851 Suicidal ideations: Secondary | ICD-10-CM | POA: Insufficient documentation

## 2021-03-11 DIAGNOSIS — Z794 Long term (current) use of insulin: Secondary | ICD-10-CM | POA: Insufficient documentation

## 2021-03-11 DIAGNOSIS — F1410 Cocaine abuse, uncomplicated: Secondary | ICD-10-CM

## 2021-03-11 DIAGNOSIS — F1424 Cocaine dependence with cocaine-induced mood disorder: Secondary | ICD-10-CM | POA: Insufficient documentation

## 2021-03-11 DIAGNOSIS — E785 Hyperlipidemia, unspecified: Secondary | ICD-10-CM | POA: Insufficient documentation

## 2021-03-11 DIAGNOSIS — Z87891 Personal history of nicotine dependence: Secondary | ICD-10-CM | POA: Insufficient documentation

## 2021-03-11 DIAGNOSIS — Z20822 Contact with and (suspected) exposure to covid-19: Secondary | ICD-10-CM | POA: Insufficient documentation

## 2021-03-11 DIAGNOSIS — F1994 Other psychoactive substance use, unspecified with psychoactive substance-induced mood disorder: Secondary | ICD-10-CM | POA: Insufficient documentation

## 2021-03-11 DIAGNOSIS — E1165 Type 2 diabetes mellitus with hyperglycemia: Secondary | ICD-10-CM | POA: Insufficient documentation

## 2021-03-11 DIAGNOSIS — F321 Major depressive disorder, single episode, moderate: Secondary | ICD-10-CM | POA: Insufficient documentation

## 2021-03-11 LAB — COMPREHENSIVE METABOLIC PANEL WITH GFR
ALT: 31 U/L (ref 0–44)
AST: 28 U/L (ref 15–41)
Albumin: 3.8 g/dL (ref 3.5–5.0)
Alkaline Phosphatase: 75 U/L (ref 38–126)
Anion gap: 12 (ref 5–15)
BUN: 18 mg/dL (ref 6–20)
CO2: 24 mmol/L (ref 22–32)
Calcium: 8.9 mg/dL (ref 8.9–10.3)
Chloride: 99 mmol/L (ref 98–111)
Creatinine, Ser: 1.22 mg/dL (ref 0.61–1.24)
GFR, Estimated: >60 mL/min
Glucose, Bld: 386 mg/dL — ABNORMAL HIGH (ref 70–99)
Potassium: 3.6 mmol/L (ref 3.5–5.1)
Sodium: 135 mmol/L (ref 135–145)
Total Bilirubin: 0.6 mg/dL (ref 0.3–1.2)
Total Protein: 6.9 g/dL (ref 6.5–8.1)

## 2021-03-11 LAB — CBC WITH DIFFERENTIAL/PLATELET
Abs Immature Granulocytes: 0.09 10*3/uL — ABNORMAL HIGH (ref 0.00–0.07)
Basophils Absolute: 0 10*3/uL (ref 0.0–0.1)
Basophils Relative: 0 %
Eosinophils Absolute: 0.1 10*3/uL (ref 0.0–0.5)
Eosinophils Relative: 1 %
HCT: 43 % (ref 39.0–52.0)
Hemoglobin: 14.5 g/dL (ref 13.0–17.0)
Immature Granulocytes: 1 %
Lymphocytes Relative: 28 %
Lymphs Abs: 3 10*3/uL (ref 0.7–4.0)
MCH: 28.4 pg (ref 26.0–34.0)
MCHC: 33.7 g/dL (ref 30.0–36.0)
MCV: 84.3 fL (ref 80.0–100.0)
Monocytes Absolute: 0.8 10*3/uL (ref 0.1–1.0)
Monocytes Relative: 7 %
Neutro Abs: 6.9 10*3/uL (ref 1.7–7.7)
Neutrophils Relative %: 63 %
Platelets: 152 10*3/uL (ref 150–400)
RBC: 5.1 MIL/uL (ref 4.22–5.81)
RDW: 13.8 % (ref 11.5–15.5)
WBC: 10.8 10*3/uL — ABNORMAL HIGH (ref 4.0–10.5)
nRBC: 0 % (ref 0.0–0.2)

## 2021-03-11 LAB — CBG MONITORING, ED
Glucose-Capillary: 398 mg/dL — ABNORMAL HIGH (ref 70–99)
Glucose-Capillary: 279 mg/dL — ABNORMAL HIGH (ref 70–99)
Glucose-Capillary: 345 mg/dL — ABNORMAL HIGH (ref 70–99)
Glucose-Capillary: 274 mg/dL — ABNORMAL HIGH (ref 70–99)
Glucose-Capillary: 284 mg/dL — ABNORMAL HIGH (ref 70–99)

## 2021-03-11 LAB — SALICYLATE LEVEL: Salicylate Lvl: <7 mg/dL — ABNORMAL LOW (ref 7.0–30.0)

## 2021-03-11 LAB — RAPID URINE DRUG SCREEN, HOSP PERFORMED
Amphetamines: NOT DETECTED
Barbiturates: NOT DETECTED
Benzodiazepines: NOT DETECTED
Cocaine: POSITIVE — AB
Opiates: NOT DETECTED
Tetrahydrocannabinol: POSITIVE — AB

## 2021-03-11 LAB — ETHANOL: Alcohol, Ethyl (B): <10 mg/dL (ref <10)

## 2021-03-11 LAB — ACETAMINOPHEN LEVEL: Acetaminophen (Tylenol), Serum: <10 ug/mL — ABNORMAL LOW (ref 10–30)

## 2021-03-11 MED ORDER — INSULIN ASPART 100 UNIT/ML IJ SOLN
0.0000 [IU] | Freq: Three times a day (TID) | INTRAMUSCULAR | Status: DC
Start: 2021-03-11 — End: 2021-03-12
  Administered 2021-03-11: 5 [IU] via SUBCUTANEOUS
  Administered 2021-03-11: 9 [IU] via SUBCUTANEOUS
  Administered 2021-03-11: 7 [IU] via SUBCUTANEOUS
  Administered 2021-03-12: 5 [IU] via SUBCUTANEOUS
  Administered 2021-03-12: 1 [IU] via SUBCUTANEOUS
  Filled 2021-03-11 (×4): qty 1

## 2021-03-11 MED ORDER — LISINOPRIL-HYDROCHLOROTHIAZIDE 20-25 MG PO TABS: 1.0000 | ORAL_TABLET | Freq: Every day | ORAL | Status: DC

## 2021-03-11 MED ORDER — HYDROCHLOROTHIAZIDE 25 MG PO TABS
25.0000 mg | ORAL_TABLET | Freq: Every day | ORAL | Status: DC
  Administered 2021-03-11 – 2021-03-12 (×2): 25 mg via ORAL
  Filled 2021-03-11 (×2): qty 1

## 2021-03-11 MED ORDER — INSULIN GLARGINE-YFGN 100 UNIT/ML ~~LOC~~ SOLN
30.0000 [IU] | Freq: Every day | SUBCUTANEOUS | Status: DC
  Administered 2021-03-11 – 2021-03-12 (×2): 30 [IU] via SUBCUTANEOUS
  Filled 2021-03-11 (×3): qty 0.3

## 2021-03-11 MED ORDER — BUPROPION HCL ER (XL) 150 MG PO TB24
150.0000 mg | ORAL_TABLET | Freq: Every day | ORAL | Status: DC
  Administered 2021-03-11 – 2021-03-12 (×2): 150 mg via ORAL
  Filled 2021-03-11 (×2): qty 1

## 2021-03-11 MED ORDER — GABAPENTIN 100 MG PO CAPS
100.0000 mg | ORAL_CAPSULE | Freq: Three times a day (TID) | ORAL | Status: DC
  Administered 2021-03-11 – 2021-03-12 (×4): 100 mg via ORAL
  Filled 2021-03-11 (×4): qty 1

## 2021-03-11 MED ORDER — LINAGLIPTIN 5 MG PO TABS
5.0000 mg | ORAL_TABLET | Freq: Every day | ORAL | Status: DC
  Administered 2021-03-11 – 2021-03-12 (×2): 5 mg via ORAL
  Filled 2021-03-11 (×2): qty 1

## 2021-03-11 MED ORDER — LISINOPRIL 10 MG PO TABS
20.0000 mg | ORAL_TABLET | Freq: Every day | ORAL | Status: DC
  Administered 2021-03-11 – 2021-03-12 (×2): 20 mg via ORAL
  Filled 2021-03-11 (×2): qty 2

## 2021-03-11 MED ORDER — ATORVASTATIN CALCIUM 40 MG PO TABS
80.0000 mg | ORAL_TABLET | Freq: Every day | ORAL | Status: DC
Start: 2021-03-11 — End: 2021-03-12
  Administered 2021-03-11 – 2021-03-12 (×2): 80 mg via ORAL
  Filled 2021-03-11 (×2): qty 2

## 2021-03-11 MED ORDER — TRAZODONE HCL 50 MG PO TABS
50.0000 mg | ORAL_TABLET | Freq: Every evening | ORAL | Status: DC | PRN
Start: 2021-03-11 — End: 2021-03-12
  Administered 2021-03-11: 50 mg via ORAL
  Filled 2021-03-11: qty 1

## 2021-03-11 NOTE — ED Notes (Signed)
Pt ambulated to restroom without difficulties.  

## 2021-03-11 NOTE — ED Provider Notes (Signed)
Patient is medically cleared and can be evaluated for suicidal ideations by behavioral health   Bethann Berkshire, MD 03/11/21 [phone]

## 2021-03-11 NOTE — ED Notes (Signed)
Pt stated that he would like to take a shower following dinner. This nurse told pt that I would gather all shower supplies once he was ready.

## 2021-03-11 NOTE — ED Notes (Signed)
Pt given phone to make 5 minute phone call

## 2021-03-11 NOTE — ED Notes (Signed)
Cbg was 279. Nurse notified

## 2021-03-11 NOTE — ED Provider Notes (Signed)
[facility] EMERGENCY DEPARTMENT Provider Note   CSN: [id number] Arrival date & time: 03/11/21  0547     History Chief Complaint  Patient presents with   Suicidal    Troy Gordon is a 55 y.o. male.  Patient with a history of hypertension and diabetes presenting with suicidal thoughts for the past several weeks.  States he been feeling this way for quite some time.  He has intermittent thoughts of jumping off a bridge or jumping in traffic.  He was prescribed pressure medications but has not had them for several months due to insurance issues.  He snorted cocaine last night but denies any other drug use.  Drinks alcohol occasionally.  Having vague homicidal thoughts as well but no hallucinations.  He denies any headache, chest pain, shortness of breath, abdominal pain, visual changes.  States he has not had his blood pressure medication for 3 days.  He takes Lantus for his diabetes. Denies any focal weakness, numbness or tingling.  No chest pain or shortness of breath.  Does have some right shoulder pain intermittently for the past 1 month without clear etiology.  The history is provided by the patient.      Past Medical History:  Diagnosis Date   Depression    Diabetes mellitus (HCC)    Dyslipidemia    Hyperlipidemia    Hypertension     Patient Active Problem List   Diagnosis Date Noted   Cocaine use disorder, severe, dependence (HCC) 10/26/2020   Major depressive disorder, recurrent episode, moderate (HCC) 10/26/2020   MDD (major depressive disorder) 10/25/2020   Depression, major, single episode, moderate (HCC) 05/22/2019   Hyperlipidemia associated with type 2 diabetes mellitus (HCC) 06/07/2017   Benign essential HTN 11/20/2016   Type 2 diabetes mellitus with hyperglycemia, with long-term current use of insulin (HCC) 11/20/2016    Past Surgical History:  Procedure Laterality Date   CIRCUMCISION     as an adult       Family History  Problem Relation Age of  Onset   Diabetes Maternal Grandmother    Hypertension Maternal Grandmother    Hypertension Father     Social History   Tobacco Use   Smoking status: Former    Years: 1.00    Types: Cigarettes   Smokeless tobacco: Never   Tobacco comments:    1 cig every 5 weeks  Vaping Use   Vaping Use: Never used  Substance Use Topics   Alcohol use: Yes    Comment: occasionally beer   Drug use: Yes    Types: Marijuana, Cocaine    Comment: last used crack 06/07/20    Home Medications Prior to Admission medications   Medication Sig Start Date End Date Taking? Authorizing Provider  atorvastatin (LIPITOR) 80 MG tablet Take 1 tablet (80 mg total) by mouth daily. For high cholesterol 10/28/20   Armandina Stammer I, NP  buPROPion (WELLBUTRIN XL) 150 MG 24 hr tablet Take 1 tablet (150 mg total) by mouth daily. For depression 10/28/20   Armandina Stammer I, NP  gabapentin (NEURONTIN) 100 MG capsule Take 1 capsule (100 mg total) by mouth 3 (three) times daily. For neuropathic pain 10/28/20   Armandina Stammer I, NP  hydrOXYzine (ATARAX/VISTARIL) 25 MG tablet Take 1 tablet (25 mg total) by mouth 3 (three) times daily as needed for anxiety. 10/28/20   Armandina Stammer I, NP  insulin glargine (LANTUS) 100 UNIT/ML injection Inject 0.3 mLs (30 Units total) into the skin daily. For diabetes management 10/29/20  Armandina Stammer I, NP  linagliptin (TRADJENTA) 5 MG TABS tablet Take 1 tablet (5 mg total) by mouth daily. For diabetes management 10/29/20   Armandina Stammer I, NP  lisinopril-hydrochlorothiazide (ZESTORETIC) 20-25 MG tablet Take 1 tablet by mouth daily. For high blood pressure 10/28/20   Armandina Stammer I, NP  traZODone (DESYREL) 50 MG tablet Take 1 tablet (50 mg total) by mouth at bedtime as needed for sleep. 10/28/20   Sanjuana Kava, NP    Allergies    Patient has no known allergies.  Review of Systems   Review of Systems  Constitutional:  Negative for activity change, appetite change and fever.  HENT:  Negative for congestion  and rhinorrhea.   Respiratory:  Negative for chest tightness and shortness of breath.   Gastrointestinal:  Negative for abdominal pain, diarrhea, nausea and vomiting.  Musculoskeletal:  Positive for arthralgias and myalgias.  Skin:  Negative for rash.  Neurological:  Negative for weakness and headaches.  Psychiatric/Behavioral:  Positive for behavioral problems, decreased concentration, sleep disturbance and suicidal ideas. The patient is nervous/anxious.    all other systems are negative except as noted in the HPI and PMH.   Physical Exam Updated Vital Signs BP (!) 200/105 (BP Location: Right Arm)    Pulse 83    Temp 97.9 F (36.6 C) (Oral)    Resp 16    Ht 5\' 8"  (1.727 m)    Wt 70.3 kg    SpO2 100%    BMI 23.57 kg/m   Physical Exam Vitals and nursing note reviewed.  Constitutional:      General: He is not in acute distress.    Appearance: He is well-developed. He is not ill-appearing.     Comments: Flat affect.  HENT:     Head: Normocephalic and atraumatic.     Mouth/Throat:     Pharynx: No oropharyngeal exudate.  Eyes:     Conjunctiva/sclera: Conjunctivae normal.     Pupils: Pupils are equal, round, and reactive to light.  Neck:     Comments: No meningismus. Cardiovascular:     Rate and Rhythm: Normal rate and regular rhythm.     Heart sounds: Normal heart sounds. No murmur heard. Pulmonary:     Effort: Pulmonary effort is normal. No respiratory distress.     Breath sounds: Normal breath sounds.  Abdominal:     Palpations: Abdomen is soft.     Tenderness: There is no abdominal tenderness. There is no guarding or rebound.  Musculoskeletal:        General: No tenderness. Normal range of motion.     Cervical back: Normal range of motion and neck supple.     Comments: Pain with ROM R shoulder  Skin:    General: Skin is warm.     Capillary Refill: Capillary refill takes less than 2 seconds.     Findings: No rash.  Neurological:     General: No focal deficit present.      Mental Status: He is alert and oriented to person, place, and time.     Cranial Nerves: No cranial nerve deficit.     Motor: No abnormal muscle tone.     Coordination: Coordination normal.     Comments:  5/5 strength throughout. CN 2-12 intact.Equal grip strength.   Psychiatric:        Behavior: Behavior normal.    ED Results / Procedures / Treatments   Labs (all labs ordered are listed, but only abnormal results are displayed) Labs  Reviewed  RAPID URINE DRUG SCREEN, HOSP PERFORMED  CBC WITH DIFFERENTIAL/PLATELET  COMPREHENSIVE METABOLIC PANEL  ETHANOL  SALICYLATE LEVEL  ACETAMINOPHEN LEVEL    EKG None  Radiology No results found.  Procedures Procedures   Medications Ordered in ED Medications - No data to display  ED Course  I have reviewed the triage vital signs and the nursing notes.  Pertinent labs & imaging results that were available during my care of the patient were reviewed by me and considered in my medical decision making (see chart for details).    MDM Rules/Calculators/A&P                         Patient with hypertension and diabetes here with depression and suicidal thoughts for the past several months.  He is calm and cooperative.  Hypertensive and cocaine use last night.  No chest pain or shortness of breath  Patient is voluntary.  He is calm and cooperative.  He is hypertensive and admits to cocaine use last night.  Has not had his blood pressure medications for several months.  Screening labs will be obtained.  Labs are pending at shift change.  He will need TTS evaluation after medical clearance.  Care transferred to Dr. Estell Harpin.    Final Clinical Impression(s) / ED Diagnoses Final diagnoses:  None    Rx / DC Orders ED Discharge Orders     None        Makylee Sanborn, Jeannett Senior, MD 03/11/21 [phone]

## 2021-03-11 NOTE — ED Notes (Signed)
TTS cart at bedside. 

## 2021-03-11 NOTE — ED Notes (Signed)
Pt given another cup of water per request- pt has had 3 cups of water since 1900

## 2021-03-11 NOTE — ED Notes (Signed)
Pt dressed in burgundy scrubs. Pt in room.

## 2021-03-11 NOTE — ED Triage Notes (Addendum)
Pt c/o suicidal thoughts for the past week. Pt has hx of same, states he didn't follow up with psych. States he is was ordered medications but doesn't take them.
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
## 2021-03-11 NOTE — ED Notes (Signed)
Pt given water, 3 saltines, 1 small packet of peanut butter, and almonds per request for snack-these are the only items available-pt given small amount d/t hx of diabetes.

## 2021-03-11 NOTE — ED Notes (Signed)
Pt taking shower at this time  

## 2021-03-12 ENCOUNTER — Other Ambulatory Visit: Payer: Self-pay

## 2021-03-12 ENCOUNTER — Encounter (HOSPITAL_COMMUNITY): Payer: Self-pay | Admitting: Registered Nurse

## 2021-03-12 ENCOUNTER — Inpatient Hospital Stay (HOSPITAL_COMMUNITY)
Admission: AD | Admit: 2021-03-12 | Discharge: 2021-03-18 | DRG: 885 | Disposition: A | Discharge status: Home / Self Care | Payer: 59 | Source: Intra-hospital | Attending: Emergency Medicine | Admitting: Emergency Medicine

## 2021-03-12 ENCOUNTER — Other Ambulatory Visit: Payer: Self-pay | Admitting: Registered Nurse

## 2021-03-12 DIAGNOSIS — F332 Major depressive disorder, recurrent severe without psychotic features: Principal | ICD-10-CM | POA: Diagnosis present

## 2021-03-12 DIAGNOSIS — I10 Essential (primary) hypertension: Secondary | ICD-10-CM | POA: Diagnosis not present

## 2021-03-12 DIAGNOSIS — Z8249 Family history of ischemic heart disease and other diseases of the circulatory system: Secondary | ICD-10-CM | POA: Diagnosis not present

## 2021-03-12 DIAGNOSIS — Z79899 Other long term (current) drug therapy: Secondary | ICD-10-CM | POA: Diagnosis not present

## 2021-03-12 DIAGNOSIS — R45851 Suicidal ideations: Secondary | ICD-10-CM | POA: Diagnosis not present

## 2021-03-12 DIAGNOSIS — F1994 Other psychoactive substance use, unspecified with psychoactive substance-induced mood disorder: Secondary | ICD-10-CM | POA: Diagnosis present

## 2021-03-12 DIAGNOSIS — E119 Type 2 diabetes mellitus without complications: Secondary | ICD-10-CM | POA: Diagnosis present

## 2021-03-12 DIAGNOSIS — F1220 Cannabis dependence, uncomplicated: Secondary | ICD-10-CM | POA: Diagnosis present

## 2021-03-12 DIAGNOSIS — F329 Major depressive disorder, single episode, unspecified: Secondary | ICD-10-CM | POA: Diagnosis not present

## 2021-03-12 DIAGNOSIS — Z87891 Personal history of nicotine dependence: Secondary | ICD-10-CM

## 2021-03-12 DIAGNOSIS — K30 Functional dyspepsia: Secondary | ICD-10-CM | POA: Diagnosis present

## 2021-03-12 DIAGNOSIS — G4700 Insomnia, unspecified: Secondary | ICD-10-CM | POA: Diagnosis present

## 2021-03-12 DIAGNOSIS — F1420 Cocaine dependence, uncomplicated: Secondary | ICD-10-CM | POA: Diagnosis present

## 2021-03-12 DIAGNOSIS — F411 Generalized anxiety disorder: Secondary | ICD-10-CM | POA: Diagnosis not present

## 2021-03-12 DIAGNOSIS — Z794 Long term (current) use of insulin: Secondary | ICD-10-CM

## 2021-03-12 DIAGNOSIS — M25511 Pain in right shoulder: Secondary | ICD-10-CM | POA: Diagnosis present

## 2021-03-12 DIAGNOSIS — E7800 Pure hypercholesterolemia, unspecified: Secondary | ICD-10-CM | POA: Diagnosis not present

## 2021-03-12 DIAGNOSIS — Z833 Family history of diabetes mellitus: Secondary | ICD-10-CM

## 2021-03-12 DIAGNOSIS — R69 Illness, unspecified: Secondary | ICD-10-CM | POA: Diagnosis not present

## 2021-03-12 LAB — GLUCOSE, CAPILLARY
Glucose-Capillary: 258 mg/dL — ABNORMAL HIGH (ref 70–99)
Glucose-Capillary: 302 mg/dL — ABNORMAL HIGH (ref 70–99)

## 2021-03-12 LAB — CBG MONITORING, ED
Glucose-Capillary: 138 mg/dL — ABNORMAL HIGH (ref 70–99)
Glucose-Capillary: 265 mg/dL — ABNORMAL HIGH (ref 70–99)

## 2021-03-12 LAB — RESP PANEL BY RT-PCR (FLU A&B, COVID) ARPGX2
Influenza A by PCR: NEGATIVE
Influenza B by PCR: NEGATIVE
SARS Coronavirus 2 by RT PCR: NEGATIVE

## 2021-03-12 MED ORDER — INSULIN GLARGINE-YFGN 100 UNIT/ML ~~LOC~~ SOLN
30.0000 [IU] | Freq: Every day | SUBCUTANEOUS | Status: DC
  Administered 2021-03-13 – 2021-03-14 (×2): 30 [IU] via SUBCUTANEOUS

## 2021-03-12 MED ORDER — LISINOPRIL-HYDROCHLOROTHIAZIDE 20-25 MG PO TABS: 1.0000 | ORAL_TABLET | Freq: Every day | ORAL | Status: DC

## 2021-03-12 MED ORDER — HYDROCHLOROTHIAZIDE 25 MG PO TABS
25.0000 mg | ORAL_TABLET | Freq: Every day | ORAL | Status: DC
  Administered 2021-03-13 – 2021-03-15 (×3): 25 mg via ORAL
  Filled 2021-03-12 (×6): qty 1

## 2021-03-12 MED ORDER — ALUM & MAG HYDROXIDE-SIMETH 200-200-20 MG/5ML PO SUSP: 30.0000 mL | ORAL | Status: DC | PRN

## 2021-03-12 MED ORDER — ATORVASTATIN CALCIUM 40 MG PO TABS
80.0000 mg | ORAL_TABLET | Freq: Every day | ORAL | Status: DC
  Administered 2021-03-13 – 2021-03-18 (×6): 80 mg via ORAL
  Filled 2021-03-12 (×3): qty 1
  Filled 2021-03-12: qty 14
  Filled 2021-03-12 (×5): qty 1

## 2021-03-12 MED ORDER — LISINOPRIL 20 MG PO TABS
20.0000 mg | ORAL_TABLET | Freq: Every day | ORAL | Status: DC
  Administered 2021-03-13 – 2021-03-15 (×3): 20 mg via ORAL
  Filled 2021-03-12 (×6): qty 1

## 2021-03-12 MED ORDER — INSULIN ASPART 100 UNIT/ML IJ SOLN: 0.0000 [IU] | Freq: Three times a day (TID) | INTRAMUSCULAR | Status: DC

## 2021-03-12 MED ORDER — TRAZODONE HCL 50 MG PO TABS
50.0000 mg | ORAL_TABLET | Freq: Every evening | ORAL | Status: DC | PRN
  Administered 2021-03-12 – 2021-03-15 (×4): 50 mg via ORAL
  Filled 2021-03-12 (×4): qty 1

## 2021-03-12 MED ORDER — HYDROXYZINE HCL 25 MG PO TABS
25.0000 mg | ORAL_TABLET | Freq: Three times a day (TID) | ORAL | Status: DC | PRN
  Administered 2021-03-12 – 2021-03-15 (×5): 25 mg via ORAL
  Filled 2021-03-12 (×5): qty 1

## 2021-03-12 MED ORDER — MAGNESIUM HYDROXIDE 400 MG/5ML PO SUSP: 30.0000 mL | Freq: Every day | ORAL | Status: DC | PRN

## 2021-03-12 MED ORDER — ACETAMINOPHEN 325 MG PO TABS
650.0000 mg | ORAL_TABLET | Freq: Four times a day (QID) | ORAL | Status: DC | PRN
  Administered 2021-03-12 – 2021-03-17 (×4): 650 mg via ORAL
  Filled 2021-03-12 (×4): qty 2

## 2021-03-12 MED ORDER — INSULIN ASPART 100 UNIT/ML IJ SOLN
0.0000 [IU] | Freq: Three times a day (TID) | INTRAMUSCULAR | Status: DC
  Administered 2021-03-12: 5 [IU] via SUBCUTANEOUS
  Administered 2021-03-13: 3 [IU] via SUBCUTANEOUS
  Administered 2021-03-13: 5 [IU] via SUBCUTANEOUS

## 2021-03-12 MED ORDER — BUPROPION HCL ER (XL) 150 MG PO TB24
150.0000 mg | ORAL_TABLET | Freq: Every day | ORAL | Status: DC
  Administered 2021-03-13 – 2021-03-18 (×6): 150 mg via ORAL
  Filled 2021-03-12: qty 7
  Filled 2021-03-12 (×9): qty 1

## 2021-03-12 NOTE — ED Notes (Signed)
Attempting to call transport service at [phone] with no response. No voicemail set up.

## 2021-03-12 NOTE — ED Notes (Signed)
Pt's belongings except for cell phone and wallet given to pt's friend/visitor per pt's request.

## 2021-03-12 NOTE — [facility] Group Notes (Signed)
[facility] Group Notes:  (Nursing/MHT/Case Management/Adjunct)  Date:  03/12/2021  Time:  9:57 PM  Type of Therapy:  Group Therapy  Participation Level:  Active  Participation Quality:  Appropriate  Affect:  Appropriate  Cognitive:  Appropriate  Insight:  Appropriate  Engagement in Group:  Engaged  Modes of Intervention:  Discussion  Summary of Progress/Problems: Patient unsure about the activity at [facility]. Dicussed with Patient the day to day schedule  at [facility].  Explained that he would have orientation group in the morning for more details.    Reymundo Poll 03/12/2021, 9:57 PM

## 2021-03-12 NOTE — Progress Notes (Signed)
CSW spoke with Pamala Hurry with [name] who requested additional information for this patient to be reviewed for possible placement. The provider at [name] wanted to review updated Glucose levels. CSW faxed updated labs.   Glennie Isle, MSW, [place], LCAS-A Phone: [phone] Disposition/TOC

## 2021-03-12 NOTE — Progress Notes (Signed)
Per Carolyn Coleman,NP, patient meets criteria for inpatient treatment. There are no available or appropriate beds at [facility] today. CSW faxed referrals to the following facilities for review: ° °[facility]  Pending - No Request Sent N/A [address] [place] [address] [phone] --  °[facility]  Pending - No Request Sent N/A [address] [place] [address] [phone] --  °[facility]  Pending - No Request Sent N/A [address] [place] [address] [phone] --  °[facility]  Pending - No Request Sent N/A [address] [place] [address] [phone] --  °[facility]  Pending - No Request Sent N/A [address], [place] [address] [phone] --  °[facility]  Pending - No Request Sent N/A [address], [place] [address] [phone] --  °[facility]  Pending - No Request Sent N/A [address] [place] [address] [phone] --  °[facility]  Pending - No Request Sent N/A [address] [place] [address] [phone] --  °[facility]  Pending - No Request Sent N/A [address] [place] [address] [phone] --  °[facility]  Pending - No Request Sent N/A [address] [place] [address] [phone] --  °[facility]  Pending - No Request Sent N/A [address], [place] [address] [phone] --  °[facility]  Pending - No Request Sent N/A [address], [place] [address] [phone] --  °[facility]  Pending - No Request Sent N/A [address] [place] [address] [phone] --   °[facility]  Pending - No Request Sent N/A [address], [place] [address] [phone] --  °[facility]  Pending - No Request Sent N/A [address],   [address] [phone] --  ° ° °TTS will continue to seek bed placement. ° °Troy Gordon, MSW, LCSW-A, LCAS-A °Phone: [phone] °Disposition/TOC ° °

## 2021-03-12 NOTE — Progress Notes (Signed)
Patient is a 56 year old male who presented under voluntary admission from [facility] for having suicidal thoughts with a plan to jump off a bridge or walk into traffic. Pt has a hx of MDD , substance induced mood disorder and DM. Pt reported that he has recently lost his job, his home, and is currently in a strained relationship. Pt also reported that he lost both of his parents in a fatal MVA two years ago around this time. Pt currently denies SI/HI and A/VH, and states, "I want to get help". Patient presented with a sad affect/ depressed but pleasant mood- was  calm and cooperative- answered questions logically and coherently during admission interview and assessment. VS monitored and recorded. Skin assessment revealed no abnormalities. Belongings searched and secured in locker. Patient was oriented to unit and schedule.Admission paperwork completed and signed. Pt oriented to the unit. Q 15 min checks initiated for safety.

## 2021-03-12 NOTE — Tx Team (Signed)
Initial Treatment Plan 03/12/2021 6:58 PM Troy Gordon ES:[id number]    PATIENT STRESSORS: Financial difficulties   Loss of job and home   Marital or family conflict     PATIENT STRENGTHS: Ability for insight  Active sense of humor  Average or above average intelligence  Capable of independent living  Hydrographic surveyor for treatment/growth  Work skills    PATIENT IDENTIFIED PROBLEMS: Suicidal ideation     "Lost job"    "Losing home"    "Depression"         DISCHARGE CRITERIA:  Ability to meet basic life and health needs Adequate post-discharge living arrangements Improved stabilization in mood, thinking, and/or behavior Need for constant or close observation no longer present Reduction of life-threatening or endangering symptoms to within safe limits Verbal commitment to aftercare and medication compliance  PRELIMINARY DISCHARGE PLAN: Attend aftercare/continuing care group Outpatient therapy Placement in alternative living arrangements  PATIENT/FAMILY INVOLVEMENT: This treatment plan has been presented to and reviewed with the patient, Troy Gordon, .  The patient has been given the opportunity to ask questions and make suggestions.  [name], RN 03/12/2021, 6:58 PM

## 2021-03-12 NOTE — Progress Notes (Signed)
Per Rosey Bath, [facility], pt has been accepted to [facility] bed 305[facility]01. Accepting provider is Ashley Murrain, Attending provider is Dr. Octavia Bruckner. Patient can arrive by 1500. Number for report is [phone].   Crissie Reese, MSW, LCSW-A Phone: [phone] Disposition/TOC

## 2021-03-13 ENCOUNTER — Encounter (HOSPITAL_COMMUNITY): Payer: Self-pay

## 2021-03-13 DIAGNOSIS — F1220 Cannabis dependence, uncomplicated: Secondary | ICD-10-CM

## 2021-03-13 DIAGNOSIS — F411 Generalized anxiety disorder: Secondary | ICD-10-CM

## 2021-03-13 LAB — LIPID PANEL
Cholesterol: 274 mg/dL — ABNORMAL HIGH (ref 0–200)
HDL: 45 mg/dL (ref >40)
LDL Cholesterol: 151 mg/dL — ABNORMAL HIGH (ref 0–99)
Total CHOL/HDL Ratio: 6.1 RATIO
Triglycerides: 390 mg/dL — ABNORMAL HIGH (ref <150)
VLDL: 78 mg/dL — ABNORMAL HIGH (ref 0–40)

## 2021-03-13 LAB — VITAMIN B12: Vitamin B-12: 473 pg/mL (ref 180–914)

## 2021-03-13 LAB — GLUCOSE, CAPILLARY
Glucose-Capillary: 221 mg/dL — ABNORMAL HIGH (ref 70–99)
Glucose-Capillary: 258 mg/dL — ABNORMAL HIGH (ref 70–99)
Glucose-Capillary: 265 mg/dL — ABNORMAL HIGH (ref 70–99)
Glucose-Capillary: 292 mg/dL — ABNORMAL HIGH (ref 70–99)

## 2021-03-13 LAB — MAGNESIUM: Magnesium: 2 mg/dL (ref 1.7–2.4)

## 2021-03-13 LAB — HEMOGLOBIN A1C
Hgb A1c MFr Bld: 10.7 % — ABNORMAL HIGH (ref 4.8–5.6)
Mean Plasma Glucose: 260.39 mg/dL

## 2021-03-13 LAB — VITAMIN D 25 HYDROXY (VIT D DEFICIENCY, FRACTURES): Vit D, 25-Hydroxy: 41.87 ng/mL (ref 30–100)

## 2021-03-13 MED ORDER — SERTRALINE HCL 25 MG PO TABS
25.0000 mg | ORAL_TABLET | Freq: Once | ORAL | Status: AC
  Administered 2021-03-13: 25 mg via ORAL
  Filled 2021-03-13 (×2): qty 1

## 2021-03-13 MED ORDER — SERTRALINE HCL 50 MG PO TABS
50.0000 mg | ORAL_TABLET | Freq: Every day | ORAL | Status: DC
  Administered 2021-03-14 – 2021-03-18 (×5): 50 mg via ORAL
  Filled 2021-03-13 (×6): qty 1
  Filled 2021-03-13: qty 7

## 2021-03-13 MED ORDER — INSULIN ASPART 100 UNIT/ML IJ SOLN
0.0000 [IU] | Freq: Every day | INTRAMUSCULAR | Status: DC
  Administered 2021-03-13 – 2021-03-14 (×2): 3 [IU] via SUBCUTANEOUS
  Administered 2021-03-15: 2 [IU] via SUBCUTANEOUS

## 2021-03-13 MED ORDER — INSULIN ASPART 100 UNIT/ML IJ SOLN
0.0000 [IU] | Freq: Three times a day (TID) | INTRAMUSCULAR | Status: DC
  Administered 2021-03-13: 5 [IU] via SUBCUTANEOUS
  Administered 2021-03-14: 7 [IU] via SUBCUTANEOUS
  Administered 2021-03-14: 5 [IU] via SUBCUTANEOUS
  Administered 2021-03-14: 3 [IU] via SUBCUTANEOUS
  Administered 2021-03-15 (×2): 2 [IU] via SUBCUTANEOUS
  Administered 2021-03-15: 3 [IU] via SUBCUTANEOUS
  Administered 2021-03-16 (×2): 2 [IU] via SUBCUTANEOUS
  Administered 2021-03-16: 3 [IU] via SUBCUTANEOUS
  Administered 2021-03-17: 2 [IU] via SUBCUTANEOUS
  Administered 2021-03-18: 1 [IU] via SUBCUTANEOUS

## 2021-03-13 MED ORDER — IBUPROFEN 400 MG PO TABS
400.0000 mg | ORAL_TABLET | Freq: Four times a day (QID) | ORAL | Status: DC | PRN
  Administered 2021-03-13 – 2021-03-17 (×8): 400 mg via ORAL
  Filled 2021-03-13 (×8): qty 1

## 2021-03-13 MED ORDER — INSULIN ASPART 100 UNIT/ML IJ SOLN
6.0000 [IU] | Freq: Three times a day (TID) | INTRAMUSCULAR | Status: DC
  Administered 2021-03-14 – 2021-03-15 (×5): 6 [IU] via SUBCUTANEOUS

## 2021-03-13 NOTE — Progress Notes (Addendum)
°   03/13/21 1000  Psych Admission Type (Psych Patients Only)  Admission Status Voluntary  Psychosocial Assessment  Patient Complaints Depression;Anxiety  Eye Contact Fair  Facial Expression Anxious  Affect Depressed;Anxious  Speech Logical/coherent  Interaction Assertive  Motor Activity Other (Comment) (wdl)  Appearance/Hygiene Unremarkable  Behavior Characteristics Cooperative  Mood Anxious;Pleasant;Sad  Thought Process  Coherency WDL  Content WDL  Delusions None reported or observed  Perception WDL  Hallucination None reported or observed  Judgment WDL  Confusion WDL  Danger to Self  Current suicidal ideation? Passive  Self-Injurious Behavior No self-injurious ideation or behavior indicators observed or expressed   Agreement Not to Harm Self Yes  Description of Agreement verbal contract for safety  Danger to Others  Danger to Others None reported or observed   D. Pt presented with a sad affect/ mood upon initial approach this am- pt reported that he slept poorly last pm, and that he woke up at 1 am and couldn't get back to sleep. Pt denied SI/HI and AVH  A. Labs and vitals monitored. Pt given and educated on medications. Pt supported emotionally and encouraged to express concerns and ask questions.   R. Pt remains safe with 15 minute checks. Will continue POC.

## 2021-03-13 NOTE — Progress Notes (Signed)
° ° °   03/12/21 2127  Charting Type  Charting Type Shift assessment  Assessment of needs addressed Yes  Orders Chart Check (once per shift) Completed  Safety Check Verification  Has the RN verified the 15 minute safety check completion? Yes  Neurological  Neuro (WDL) WDL  HEENT  HEENT (WDL) WDL  Respiratory  Respiratory (WDL) WDL  Cardiac  Cardiac (WDL) X (Hx: HTN)  Vascular  Vascular (WDL) X (Hx: HTN and DM)  Integumentary  Integumentary (WDL) WDL  Braden Scale (Ages 8 and up)  Sensory Perceptions 4  Moisture 4  Activity 4  Mobility 4  Nutrition 3  Friction and Shear 3  Braden Scale Score 22  Musculoskeletal  Musculoskeletal (WDL) WDL  Assistive Device None  Gastrointestinal  Gastrointestinal (WDL) WDL  GU Assessment  Genitourinary (WDL) WDL

## 2021-03-13 NOTE — BH IP Treatment Plan (Signed)
Interdisciplinary Treatment and Diagnostic Plan Update  03/13/2021 Time of Session: 9:00am Troy Gordon MRN: [id number]  Principal Diagnosis: Substance induced mood disorder ([place])  Secondary Diagnoses: Principal Problem:   Substance induced mood disorder ([place]) Active Problems:   MDD (major depressive disorder)   Cocaine use disorder, severe, dependence (HCC)   Moderate cannabis use disorder (HCC)   Current Medications:  Current Facility-Administered Medications  Medication Dose Route Frequency Provider Last Rate Last Admin   acetaminophen (TYLENOL) tablet 650 mg  650 mg Oral Q6H PRN Rankin, Shuvon B, NP   650 mg at 03/13/21 0619   alum & mag hydroxide-simeth (MAALOX/MYLANTA) 200-200-20 MG/5ML suspension 30 mL  30 mL Oral Q4H PRN Rankin, Shuvon B, NP       atorvastatin (LIPITOR) tablet 80 mg  80 mg Oral Daily Rankin, Shuvon B, NP   80 mg at 03/13/21 0805   buPROPion (WELLBUTRIN XL) 24 hr tablet 150 mg  150 mg Oral Daily Rankin, Shuvon B, NP   150 mg at 03/13/21 0806   lisinopril (ZESTRIL) tablet 20 mg  20 mg Oral Daily Hill, Jackie Plum, MD   20 mg at 03/13/21 0805   And   hydrochlorothiazide (HYDRODIURIL) tablet 25 mg  25 mg Oral Daily Maida Sale, MD   25 mg at 03/13/21 0805   hydrOXYzine (ATARAX) tablet 25 mg  25 mg Oral TID PRN Bobbitt, Shalon E, NP   25 mg at 03/12/21 2127   insulin aspart (novoLOG) injection 0-9 Units  0-9 Units Subcutaneous TID WC Hill, Jackie Plum, MD   3 Units at 03/13/21 5027   insulin glargine-yfgn [facility]) injection 30 Units  30 Units Subcutaneous Daily Rankin, Shuvon B, NP   30 Units at 03/13/21 0803   magnesium hydroxide (MILK OF MAGNESIA) suspension 30 mL  30 mL Oral Daily PRN Rankin, Shuvon B, NP       traZODone (DESYREL) tablet 50 mg  50 mg Oral QHS PRN Rankin, Shuvon B, NP   50 mg at 03/12/21 2127   PTA Medications: Medications Prior to Admission  Medication Sig Dispense Refill Last Dose   atorvastatin (LIPITOR) 80 MG tablet Take  1 tablet (80 mg total) by mouth daily. For high cholesterol 30 tablet 0    buPROPion (WELLBUTRIN XL) 150 MG 24 hr tablet Take 1 tablet (150 mg total) by mouth daily. For depression (Patient not taking: Reported on 03/11/2021) 30 tablet 0    gabapentin (NEURONTIN) 100 MG capsule Take 1 capsule (100 mg total) by mouth 3 (three) times daily. For neuropathic pain (Patient not taking: Reported on 03/11/2021) 90 capsule 0    insulin glargine (LANTUS) 100 UNIT/ML injection Inject 0.3 mLs (30 Units total) into the skin daily. For diabetes management 10 mL 0    linagliptin (TRADJENTA) 5 MG TABS tablet Take 1 tablet (5 mg total) by mouth daily. For diabetes management (Patient not taking: Reported on 03/11/2021) 30 tablet 0    lisinopril-hydrochlorothiazide (ZESTORETIC) 20-25 MG tablet Take 1 tablet by mouth daily. For high blood pressure 30 tablet 0    traZODone (DESYREL) 50 MG tablet Take 1 tablet (50 mg total) by mouth at bedtime as needed for sleep. (Patient not taking: Reported on 03/11/2021) 30 tablet 0     Patient Stressors: Financial difficulties   Loss of job and home   Marital or family conflict    Patient Strengths: Ability for insight  Active sense of humor  Average or above average intelligence  Capable of independent living  Communication skills  Motivation for treatment/growth  Work skills   Treatment Modalities: Medication Management, Group therapy, Case management,  1 to 1 session with clinician, Psychoeducation, Recreational therapy.   Physician Treatment Plan for Primary Diagnosis: Substance induced mood disorder ([place]) Long Term Goal(s):     Short Term Goals:    Medication Management: Evaluate patient's response, side effects, and tolerance of medication regimen.  Therapeutic Interventions: 1 to 1 sessions, Unit Group sessions and Medication administration.  Evaluation of Outcomes: Not Met  Physician Treatment Plan for Secondary Diagnosis: Principal Problem:   Substance  induced mood disorder ([place]) Active Problems:   MDD (major depressive disorder)   Cocaine use disorder, severe, dependence (HCC)   Moderate cannabis use disorder ([place])  Long Term Goal(s):     Short Term Goals:       Medication Management: Evaluate patient's response, side effects, and tolerance of medication regimen.  Therapeutic Interventions: 1 to 1 sessions, Unit Group sessions and Medication administration.  Evaluation of Outcomes: Not Met   RN Treatment Plan for Primary Diagnosis: Substance induced mood disorder ([place]) Long Term Goal(s): Knowledge of disease and therapeutic regimen to maintain health will improve  Short Term Goals: Ability to remain free from injury will improve, Ability to verbalize frustration and anger appropriately will improve, Ability to verbalize feelings will improve, Ability to disclose and discuss suicidal ideas, Ability to identify and develop effective coping behaviors will improve, and Compliance with prescribed medications will improve  Medication Management: RN will administer medications as ordered by provider, will assess and evaluate patient's response and provide education to patient for prescribed medication. RN will report any adverse and/or side effects to prescribing provider.  Therapeutic Interventions: 1 on 1 counseling sessions, Psychoeducation, Medication administration, Evaluate responses to treatment, Monitor vital signs and CBGs as ordered, Perform/monitor CIWA, COWS, AIMS and Fall Risk screenings as ordered, Perform wound care treatments as ordered.  Evaluation of Outcomes: Not Met   LCSW Treatment Plan for Primary Diagnosis: Substance induced mood disorder ([place]) Long Term Goal(s): Safe transition to appropriate next level of care at discharge, Engage patient in therapeutic group addressing interpersonal concerns.  Short Term Goals: Engage patient in aftercare planning with referrals and resources, Increase social support, Increase  ability to appropriately verbalize feelings, Identify triggers associated with mental health/substance abuse issues, and Increase skills for wellness and recovery  Therapeutic Interventions: Assess for all discharge needs, 1 to 1 time with Social worker, Explore available resources and support systems, Assess for adequacy in community support network, Educate family and significant other(s) on suicide prevention, Complete Psychosocial Assessment, Interpersonal group therapy.  Evaluation of Outcomes: Not Met   Progress in Treatment: Attending groups: Yes. Participating in groups: Yes. Taking medication as prescribed: Yes. Toleration medication: Yes. Family/Significant other contact made: No, will contact:  if consent is provided Patient understands diagnosis: Yes. Discussing patient identified problems/goals with staff: Yes. Medical problems stabilized or resolved: Yes. Denies suicidal/homicidal ideation: Yes. Issues/concerns per patient self-inventory: No.   New problem(s) identified: No, Describe:  none  New Short Term/Long Term Goal(s): detox, medication management for mood stabilization; elimination of SI thoughts; development of comprehensive mental wellness/sobriety plan  Patient Goals:  "To get back on medicine, maintain, and be open-minded"  Discharge Plan or Barriers: Pt is interested in getting into [facility] and receiving outpatient therapy and medication management  Reason for Continuation of Hospitalization: Depression Homicidal ideation Medication stabilization Suicidal ideation Withdrawal symptoms  Estimated Length of Stay: 3-5 days  Scribe for Treatment Team: Vassie Moselle, LCSW 03/13/2021 9:53 AM

## 2021-03-13 NOTE — H&P (Signed)
Psychiatric Admission Assessment Adult  Patient Identification: Troy Gordon MRN:  [id number] Date of Evaluation:  03/13/2021 Chief Complaint:  Substance induced mood disorder (HCC) [F19.94] Principal Diagnosis: MDD (major depressive disorder) Diagnosis:  Principal Problem:   MDD (major depressive disorder) Active Problems:   Cocaine use disorder, severe, dependence (HCC)   Suicidal ideation   Moderate cannabis use disorder (HCC)   GAD (generalized anxiety disorder)  History of Present Illness: Troy Gordon is a 56 year old male with a past history of MDD, cocaine & cannabis abuse, hypertension and Diabetes, who walked into [facility] ED with complaints of worsening suicidal ideations x 1 week, with a plan to jump off a bridge or into traffic. Pt was transferred to this [facility]) for treatment and stabilization of his mood.  Evaluation on unit Pt able to validate the above information, and reports that his depressive symptoms started 2 years ago after he lost both of his parents in a motor vehicle accident. He states that over the past week, his symptoms have worsened. He reports hopelessness, irritability, worthlessness, sadness, helplessness, anhedonia, frustration,decreased energy levels, decreased concentration, sadness, and insomnia, which have worsened over the course of three months. Pt reports that he is from [place], moved to  14 months ago, and was able to seek mental health care at [facility] then. Pt reports that he was diagnosed with MDD and medications were ordered for him, but he stopped taking them 3 months ago because he stopped following up with [facility], and could not afford the medications. Pt currently denies SI/HI/AVH, but states he started hearing voices three years ago, and last heard them/saw things 2 weeks ago. He states the voices call his name, and he sees someone moving across his room at home. Pt denies any history of trauma, denies any  emotional/physical or sexual abuse, denies any self injurious/fire setting behaviors, and denies having any current legal issues.  Pt lists his current stressors as mostly financial; states his car needs an engine and he has no money to get it fix, and therefore cannot get transportation to look for work. Pt also reports that he is about to lose his housing because he owes $1800. 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 Pt reports that he was born and raised in [place], has 7 sisters, one brother, one son and three grandchildren. Pt reports his highest level of education as being high school. Pt reports a history of cocaine use which started 2 years ago after his parents died, and states that he uses it twice a month. Pt endorses daily marijuana use, and states he smokes one blunt/day. Pt states that he drinks 2-3 beers/week, and denies using tobacco products or any other recreational substances.  Pt calm and cooperative, has good eye contact and attention to personal hygiene and grooming is good as he recounts the events leading to this hospitalization. Mood is depressed, affect is flat and congruent, speech is clear and coherent, thought contents are logical. Pt denies paranoia, and there are currently no delusional thoughts. Pt reports that his appetite is good, reports poor sleep last night, states that it was related to staff open the door multiple times to do checks. Pt has been educated that staff will be notified to leave his door slightly open so that checks can be completed without disrupting his sleep. Pt agreeable to this. B/P is currently elevated at 149/95, HR-88. Pt restarted on his  BP medications. Will monitor. TSH, baseline EKG, Baseline UA ordered. Pt reports right shoulder pain with diminished range of motion to right arm, stating he fell 5 months ago on the right side. He complains of pain of 7.5 to this extremity. Ibuprofen 400 mg Q 6 H  PRN ordered. Hemoglobin A1C is 10.7, pt educated on making healthier choices during meals.  Pt on insulin Glargine 30 units daily and  on scheduled Novolog TID and also on sliding scale Insulin Novolog coverage three times a day with meals. Endocrinologist consult placed.   Associated Signs/Symptoms: Depression Symptoms:  depressed mood, anhedonia, insomnia, fatigue, feelings of worthlessness/guilt, difficulty concentrating, hopelessness, recurrent thoughts of death, suicidal thoughts with specific plan, loss of energy/fatigue, disturbed sleep, Duration of Depression Symptoms: Greater than two weeks  (Hypo) Manic Symptoms:  Distractibility, Irritable Mood, Anxiety Symptoms:  Excessive Worry, Panic Symptoms, Psychotic Symptoms:  Hallucinations: Auditory Visual PTSD Symptoms: NA Total Time spent with patient: 1 hour  Past Psychiatric History: MDD  Is the patient at risk to self? Yes.    Has the patient been a risk to self in the past 6 months? Yes.    Has the patient been a risk to self within the distant past? Yes.    Is the patient a risk to others? No.  Has the patient been a risk to others in the past 6 months? No.  Has the patient been a risk to others within the distant past? No.   Prior Inpatient Therapy: N/A Prior Outpatient Therapy:  N/A  Alcohol Screening: 1. How often do you have a drink containing alcohol?: 2 to 4 times a month 2. How many drinks containing alcohol do you have on a typical day when you are drinking?: 3 or 4 3. How often do you have six or more drinks on one occasion?: Never AUDIT-C Score: 3 4. How often during the last year have you found that you were not able to stop drinking once you had started?: Never 5. How often during the last year have you failed to do what was normally expected from you because of drinking?: Never 6. How often during the last year have you needed a first drink in the morning to get yourself going after a heavy drinking  session?: Never 7. How often during the last year have you had a feeling of guilt of remorse after drinking?: Never 8. How often during the last year have you been unable to remember what happened the night before because you had been drinking?: Never 9. Have you or someone else been injured as a result of your drinking?: No 10. Has a relative or friend or a doctor or another health worker been concerned about your drinking or suggested you cut down?: No Alcohol Use Disorder Identification Test Final Score (AUDIT): 3 Substance Abuse History in the last 12 months:  No. Consequences of Substance Abuse: Negative Previous Psychotropic Medications: No  Psychological Evaluations: No  Past Medical History:  Past Medical History:  Diagnosis Date   Depression    Diabetes mellitus (HCC)    Dyslipidemia    Hyperlipidemia    Hypertension     Past Surgical History:  Procedure Laterality Date   CIRCUMCISION     as an adult   Family History:  Family History  Problem Relation Age of Onset   Diabetes Maternal Grandmother    Hypertension Maternal Grandmother    Hypertension Father    Family Psychiatric  History: See above Tobacco Screening:  Social History:  Social History   Substance and Sexual Activity  Alcohol Use Not Currently   Comment: occasionally beer     Social History   Substance and Sexual Activity  Drug Use Yes   Types: Marijuana, Cocaine   Comment: last used crack 06/07/20    Allergies:  No Known Allergies Lab Results:  Results for orders placed or performed during the hospital encounter of 03/12/21 (from the past 48 hour(s))  Glucose, capillary     Status: Abnormal   Collection Time: 03/12/21  5:26 PM  Result Value Ref Range   Glucose-Capillary 258 (H) 70 - 99 mg/dL    Comment: Glucose reference range applies only to samples taken after fasting for at least 8 hours.   Comment 1 Notify RN    Comment 2 Document in Chart   Glucose, capillary     Status: Abnormal    Collection Time: 03/12/21  7:58 PM  Result Value Ref Range   Glucose-Capillary 302 (H) 70 - 99 mg/dL    Comment: Glucose reference range applies only to samples taken after fasting for at least 8 hours.  Glucose, capillary     Status: Abnormal   Collection Time: 03/13/21  5:54 AM  Result Value Ref Range   Glucose-Capillary 221 (H) 70 - 99 mg/dL    Comment: Glucose reference range applies only to samples taken after fasting for at least 8 hours.  Hemoglobin A1c     Status: Abnormal   Collection Time: 03/13/21  6:25 AM  Result Value Ref Range   Hgb A1c MFr Bld 10.7 (H) 4.8 - 5.6 %    Comment: (NOTE) Pre diabetes:          5.7%-6.4%  Diabetes:              >6.4%  Glycemic control for   <7.0% adults with diabetes    Mean Plasma Glucose 260.39 mg/dL    Comment: Performed at [facility], [address], [place], [place] [address]  Magnesium     Status: None   Collection Time: 03/13/21  6:25 AM  Result Value Ref Range   Magnesium 2.0 1.7 - 2.4 mg/dL    Comment: Performed at [facility], [address] [place], [place] [address]  VITAMIN D 25 Hydroxy (Vit-D Deficiency, Fractures)     Status: None   Collection Time: 03/13/21  6:25 AM  Result Value Ref Range   Vit D, 25-Hydroxy 41.87 30 - 100 ng/mL    Comment: (NOTE) Vitamin D deficiency has been defined by the Institute of Medicine  and an Endocrine Society practice guideline as a level of serum 25-OH  vitamin D less than 20 ng/mL (1,2). The Endocrine Society went on to  further define vitamin D insufficiency as a level between 21 and 29  ng/mL (2).  1. IOM (Institute of Medicine). 2010. Dietary reference intakes for  calcium and D. [place]: [employer]. 2. Holick MF, Binkley Tuttle, Bischoff-Ferrari HA, et al. Evaluation,  treatment, and prevention of vitamin D deficiency: an Endocrine  Society clinical practice guideline, JCEM. 2011 Jul; 96(7): 1911-30.  Performed at [facility], [address], [place], [place] [address]   Vitamin B12     Status: None   Collection Time: 03/13/21  6:25 AM  Result Value Ref Range   Vitamin B-12 473 180 - 914 pg/mL    Comment: (NOTE) This assay is not validated for testing neonatal or myeloproliferative  syndrome specimens for Vitamin B12 levels. Performed at [facility], [address] [place], [place] [address]   Lipid panel     Status: Abnormal   Collection Time: 03/13/21  6:25 AM  Result Value Ref Range   Cholesterol 274 (H) 0 - 200 mg/dL   Triglycerides 191390 (H) <150 mg/dL   HDL 45 >47>40 mg/dL   Total CHOL/HDL Ratio 6.1 RATIO   VLDL 78 (H) 0 - 40 mg/dL   LDL Cholesterol 829151 (H) 0 - 99 mg/dL    Comment:        Total Cholesterol/HDL:CHD Risk Coronary Heart Disease Risk Table                     Men   Women  1/2 Average Risk   3.4   3.3  Average Risk       5.0   4.4  2 X Average Risk   9.6   7.1  3 X Average Risk  23.4   11.0        Use the calculated Patient Ratio above and the CHD Risk Table to determine the patient's CHD Risk.        ATP III CLASSIFICATION (LDL):  <100     mg/dL   Optimal  562-130100-129  mg/dL   Near or Above                    Optimal  130-159  mg/dL   Borderline  [id number]  mg/dL   High  >696>190     mg/dL   Very High Performed at [facility], [address] [place], [place] [address]   Glucose, capillary     Status: Abnormal   Collection Time: 03/13/21 11:50 AM  Result Value Ref Range   Glucose-Capillary 258 (H) 70 - 99 mg/dL    Comment: Glucose reference range applies only to samples taken after fasting for at least 8 hours.   Comment 1 Notify RN   Glucose, capillary     Status: Abnormal   Collection Time: 03/13/21  4:49 PM  Result Value Ref Range   Glucose-Capillary 265 (H) 70 - 99 mg/dL    Comment: Glucose reference range applies only to samples taken after fasting for at least 8 hours.    Blood Alcohol level:  Lab Results  Component Value  Date   ETH <10 03/11/2021   ETH <10 10/24/2020    Metabolic Disorder Labs:  Lab Results  Component Value Date   HGBA1C 10.7 (H) 03/13/2021   MPG 260.39 03/13/2021   MPG 277.61 10/24/2020   No results found for: PROLACTIN Lab Results  Component Value Date   CHOL 274 (H) 03/13/2021   TRIG 390 (H) 03/13/2021   HDL 45 03/13/2021   CHOLHDL 6.1 03/13/2021   VLDL 78 (H) 03/13/2021   LDLCALC 151 (H) 03/13/2021   LDLCALC 222 (H) 10/26/2020    Current Medications: Current Facility-Administered Medications  Medication Dose Route Frequency Provider Last Rate Last Admin   acetaminophen (TYLENOL) tablet 650 mg  650 mg Oral Q6H PRN Rankin, Shuvon B, NP   650 mg at 03/13/21 1417   alum & mag hydroxide-simeth (MAALOX/MYLANTA) 200-200-20 MG/5ML suspension 30 mL  30 mL Oral Q4H PRN Rankin, Shuvon B, NP       atorvastatin (LIPITOR) tablet 80 mg  80 mg Oral Daily Rankin, Shuvon B, NP   80 mg at 03/13/21 0805   buPROPion (WELLBUTRIN XL) 24 hr  tablet 150 mg  150 mg Oral Daily Rankin, Shuvon B, NP   150 mg at 03/13/21 0806   lisinopril (ZESTRIL) tablet 20 mg  20 mg Oral Daily Hill, Shelbie Hutching, MD   20 mg at 03/13/21 4599   And   hydrochlorothiazide (HYDRODIURIL) tablet 25 mg  25 mg Oral Daily Hill, Shelbie Hutching, MD   25 mg at 03/13/21 0805   hydrOXYzine (ATARAX) tablet 25 mg  25 mg Oral TID PRN Roselyn Bering E, NP   25 mg at 03/12/21 2127   ibuprofen (ADVIL) tablet 400 mg  400 mg Oral Q6H PRN Starleen Blue, NP       insulin aspart (novoLOG) injection 0-5 Units  0-5 Units Subcutaneous QHS Massengill, Nathan, MD       insulin aspart (novoLOG) injection 0-9 Units  0-9 Units Subcutaneous TID WC Massengill, Harrold Donath, MD       insulin glargine-yfgn (SEMGLEE) injection 30 Units  30 Units Subcutaneous Daily Rankin, Shuvon B, NP   30 Units at 03/13/21 0803   magnesium hydroxide (MILK OF MAGNESIA) suspension 30 mL  30 mL Oral Daily PRN Rankin, Shuvon B, NP       [START ON 03/14/2021] sertraline (ZOLOFT)  tablet 50 mg  50 mg Oral Daily Shiloh Southern, NP       traZODone (DESYREL) tablet 50 mg  50 mg Oral QHS PRN Rankin, Shuvon B, NP   50 mg at 03/12/21 2127   PTA Medications: Medications Prior to Admission  Medication Sig Dispense Refill Last Dose   atorvastatin (LIPITOR) 80 MG tablet Take 1 tablet (80 mg total) by mouth daily. For high cholesterol 30 tablet 0    buPROPion (WELLBUTRIN XL) 150 MG 24 hr tablet Take 1 tablet (150 mg total) by mouth daily. For depression (Patient not taking: Reported on 03/11/2021) 30 tablet 0    gabapentin (NEURONTIN) 100 MG capsule Take 1 capsule (100 mg total) by mouth 3 (three) times daily. For neuropathic pain (Patient not taking: Reported on 03/11/2021) 90 capsule 0    insulin glargine (LANTUS) 100 UNIT/ML injection Inject 0.3 mLs (30 Units total) into the skin daily. For diabetes management 10 mL 0    linagliptin (TRADJENTA) 5 MG TABS tablet Take 1 tablet (5 mg total) by mouth daily. For diabetes management (Patient not taking: Reported on 03/11/2021) 30 tablet 0    lisinopril-hydrochlorothiazide (ZESTORETIC) 20-25 MG tablet Take 1 tablet by mouth daily. For high blood pressure 30 tablet 0    traZODone (DESYREL) 50 MG tablet Take 1 tablet (50 mg total) by mouth at bedtime as needed for sleep. (Patient not taking: Reported on 03/11/2021) 30 tablet 0    Musculoskeletal: Strength & Muscle Tone: within normal limits Gait & Station: normal Patient leans: N/A  Psychiatric Specialty Exam:  Presentation  General Appearance: Appropriate for Environment; Fairly Groomed  Eye Contact:Good  Speech:Clear and Coherent  Speech Volume:Decreased  Handedness:Right  Mood and Affect  Mood:Depressed; Hopeless; Irritable; Worthless; Anxious  Affect:Flat  Thought Process  Thought Processes:Coherent  Duration of Psychotic Symptoms: N/A  Past Diagnosis of Schizophrenia or Psychoactive disorder: No  Descriptions of Associations:Intact  Orientation:Full (Time,  Place and Person)  Thought Content:Logical  Hallucinations:Hallucinations: Auditory; Visual  Ideas of Reference:None  Suicidal Thoughts:Suicidal Thoughts: No  Homicidal Thoughts:Homicidal Thoughts: No  Sensorium  Memory:Immediate Good  Judgment:Fair  Insight:Fair  Executive Functions  Concentration:Fair  Attention Span:Fair  Recall:Fair  Fund of Knowledge:Fair  Language:Fair  Psychomotor Activity  Psychomotor Activity:Psychomotor Activity: Normal  Assets  Assets:Desire for Improvement; Communication Skills  Sleep  Sleep:Sleep: Poor  Physical Exam: Physical Exam HENT:     Head: Normocephalic.     Nose: No congestion or rhinorrhea.  Eyes:     General:        Right eye: No discharge.        Left eye: No discharge.  Cardiovascular:     Heart sounds: No murmur heard.   No gallop.  Pulmonary:     Effort: No respiratory distress.     Breath sounds: No stridor.  Abdominal:     General: There is no distension.  Musculoskeletal:        General: No swelling or deformity.     Cervical back: No rigidity or tenderness.  Skin:    Coloration: Skin is not jaundiced.  Neurological:     Mental Status: He is alert and oriented to person, place, and time.     Cranial Nerves: No cranial nerve deficit.     Sensory: No sensory deficit.     Coordination: Coordination normal.  Psychiatric:        Behavior: Behavior normal.        Thought Content: Thought content normal.   Review of Systems  Constitutional: Negative.  Negative for chills, fever and malaise/fatigue.  HENT: Negative.  Negative for congestion, hearing loss and sore throat.   Eyes: Negative.  Negative for blurred vision and redness.  Respiratory: Negative.  Negative for cough, shortness of breath and wheezing.   Cardiovascular: Negative.  Negative for chest pain, palpitations and leg swelling.  Gastrointestinal: Negative.  Negative for abdominal pain, constipation, diarrhea, heartburn, nausea and  vomiting.  Genitourinary: Negative.  Negative for dysuria, frequency and urgency.  Musculoskeletal: Negative.  Negative for joint pain, myalgias and neck pain.  Skin: Negative.  Negative for itching and rash.  Neurological: Negative.  Negative for dizziness, tingling, tremors, weakness and headaches.  Endo/Heme/Allergies: Negative.   Psychiatric/Behavioral:  Positive for depression and substance abuse. Negative for hallucinations, memory loss and suicidal ideas. The patient is nervous/anxious and has insomnia.   Blood pressure (!) 149/95, pulse 88, temperature 98.5 F (36.9 C), temperature source Oral, resp. rate 18, height 5\' 8"  (1.727 m), weight 84.4 kg, SpO2 98 %. Body mass index is 28.28 kg/m.  Treatment Plan Summary: Daily contact with patient to assess and evaluate symptoms and progress in treatment and Medication management  Observation Level/Precautions:  15 minute checks  Laboratory:  Labs reviewed TSH, baseline EKG, Baseline UA ordered.  Psychotherapy:  Unit Group sessions  Medications:  See [facility]  Consultations:  To be determined   Discharge Concerns:  Safety, medication compliance, mood stability  Estimated LOS: 5-7 days  Other:  N/A   Physician Treatment Plan for Primary Diagnosis: MDD (major depressive disorder)  PLAN Safety and Monitoring: Voluntary admission to inpatient psychiatric unit for safety, stabilization and treatment Daily contact with patient to assess and evaluate symptoms and progress in treatment Patient's case to be discussed in multi-disciplinary team meeting Observation Level : q15 minute checks Vital signs: q12 hours Precautions: suicide  Physician Treatment Plan for Secondary Diagnosis:  Principal Problem:   MDD (major depressive disorder) Active Problems:   Cocaine use disorder, severe, dependence (HCC)   Suicidal ideation   Moderate cannabis use disorder (HCC)   GAD (generalized anxiety disorder)  Long Term Goal(s): Improvement in symptoms  so as ready for discharge  Short Term Goals: Ability to identify and develop effective coping behaviors will improve, Ability to maintain clinical  measurements within normal limits will improve, Compliance with prescribed medications will improve, and Ability to identify triggers associated with substance abuse/mental health issues will improve. Ability to identify changes in lifestyle to reduce recurrence of condition will improve, Ability to verbalize feelings will improve, and Ability to disclose and discuss suicidal ideas.  Medications Principal Problem: MDD (major depressive disorder) -Start Wellbutrin XL 150 mg daily  Anxiety -Start Sertraline (25mg  given today, will start 50 mg daily on 03/14/21 -Continue Atarax 25 mg TID PRN  Insomnia Start Trazodone 50 mg nightly PRN  Medications for other medical reasons -Continue Atorvastatin 80 mg daily for hypercholesteremia -Continue Hydrochlorothiazide 25 mg for hypertension -Continue Lisinopril 20 mg daily for hypertension -Continue Hydrochlorothiazide 25 mg daily for hypertension    PRNS -Continue Tylenol 650 mg every 6 hours PRN for mild pain -Continue Maalox 30 mg every 4 hrs PRN for indigestion -Continue Milk of Magnesia as needed every 6 hrs for constipation  Discharge Planning: Social work and case management to assist with discharge planning and identification of hospital follow-up needs prior to discharge Estimated LOS: 5-7 days Discharge Concerns: Need to establish a safety plan; Medication compliance and effectiveness Discharge Goals: Return home with outpatient referrals for mental health follow-up including medication management/psychotherapy  I certify that inpatient services furnished can reasonably be expected to improve the patient's condition.    05/12/21, NP 1/2/20234:53 PM

## 2021-03-13 NOTE — Progress Notes (Signed)
Inpatient Diabetes Program Recommendations  AACE/ADA: New Consensus Statement on Inpatient Glycemic Control (2015)  Target Ranges:  Prepandial:   less than 140 mg/dL      Peak postprandial:   less than 180 mg/dL (1-2 hours)      Critically ill patients:  140 - 180 mg/dL    Latest Reference Range & Units 03/12/21 09:03 03/12/21 12:09 03/12/21 17:26 03/12/21 19:58  Glucose-Capillary 70 - 99 mg/dL 786 (H)  1 unit Novolog  30 units Semglee @0952   265 (H)  5 units Novolog  258 (H)  5 units Novolog  302 (H)  (H): Data is abnormally high  Latest Reference Range & Units 03/13/21 05:54 03/13/21 11:50  Glucose-Capillary 70 - 99 mg/dL 05/11/21 (H)  3 units Novolog  30 units Semglee @0803   258 (H)  5 units Novolog   (H): Data is abnormally high  Latest Reference Range & Units 06/07/20 21:36 10/24/20 15:22 03/13/21 06:25  Hemoglobin A1C 4.8 - 5.6 % 10.6 (H) 11.3 (H) 10.7 (H)  (H): Data is abnormally high    Admit with: Substance induced mood disorder Suicidal ideation  History: DM  Home DM Meds: Lantus 30 units daily       Tradjenta 5 mg daily (NOT taking)  Current Orders: Novolog Sensitive Correction Scale/ SSI (0-9 units) TID AC      Semglee 30 units daily   MD- Please consider adding Novolog Meal Coverage:  Novolog 6 units TID with meals  Hold if pt eats <50% of meal, Hold if pt NPO    --Will follow patient during hospitalization--  10/26/20 RN, MSN, CDE Diabetes Coordinator Inpatient Glycemic Control Team Team Pager: [phone] (8a-5p)

## 2021-03-13 NOTE — Group Note (Signed)
Recreation Therapy Group Note   Group Topic:Stress Management  Group Date: 03/13/2021 Start Time: 0930 End Time: 0945 Facilitators: Victorino Sparrow, [place] Location: [address] Day[address]   Goal Area(s) Addresses:  Patient will identify positive stress management techniques. Patient will identify benefits of using stress management post d/c.  Group Description:  Meditation.  LRT played a meditation that focused on letting go of the past and living in the moment.  Patients were to listen as meditation played to fully engage in the activity.   Affect/Mood: N/A   Participation Level: Did not attend    Clinical Observations/Individualized Feedback:     Plan: Continue to engage patient in RT group sessions 2-3x/week.   Victorino Sparrow, LRT,CTRS  03/13/2021 11:07 AM

## 2021-03-13 NOTE — [facility] Suicide Risk Assessment (Addendum)
Suicide Risk Assessment  Admission Assessment    [facility] Admission Suicide Risk Assessment   Nursing information obtained from:  Patient Demographic factors:  Unemployed, Male Current Mental Status:  Suicidal ideation indicated by patient Loss Factors:  Decrease in vocational status, Decline in physical health, Financial problems / change in socioeconomic status Historical Factors:  Anniversary of important loss Risk Reduction Factors:  Living with another person, especially a relative  Total Time spent with patient: 1 hour Principal Problem: MDD (major depressive disorder) Diagnosis:  Principal Problem:   MDD (major depressive disorder) Active Problems:   Cocaine use disorder, severe, dependence ([place])   Suicidal ideation   Moderate cannabis use disorder (HCC)   GAD (generalized anxiety disorder)  History of Present Illness: Troy Gordon is a 56 year old male with a past history of MDD, cocaine & cannabis abuse, hypertension and Diabetes, who walked into [facility] ED with complaints of worsening suicidal ideations x 1 week, with a plan to jump off a bridge or into traffic. Pt was transferred to this [facility]) for treatment and stabilization of his mood.   Continued Clinical Symptoms: Pt able to validate the above information, and reports that his depressive symptoms started 2 years ago after he lost both of his parents in a motor vehicle accident. He states that over the past week, his symptoms have worsened. He reports hopelessness, irritability, worthlessness, sadness, helplessness, anhedonia, frustration,decreased energy levels, decreased concentration, sadness, and insomnia, which have worsened over the course of three months. Pt is currently deemed to be at high risk for completing a suicide and continued hospitalization is necessary to treat and stabilize his mood.  Alcohol Use Disorder Identification Test Final Score (AUDIT): 3 The "Alcohol Use  Disorders Identification Test", Guidelines for Use in Primary Care, Second Edition.  World Pharmacologist [facility]). Score between 0-7:  no or low risk or alcohol related problems. Score between 8-15:  moderate risk of alcohol related problems. Score between 16-19:  high risk of alcohol related problems. Score 20 or above:  warrants further diagnostic evaluation for alcohol dependence and treatment.  CLINICAL FACTORS:  Depression Symptoms:  depressed mood, anhedonia, insomnia, fatigue, feelings of worthlessness/guilt, difficulty concentrating, hopelessness, recurrent thoughts of death, suicidal thoughts with specific plan, loss of energy/fatigue, disturbed sleep.  Musculoskeletal: Strength & Muscle Tone: within normal limits Gait & Station: normal Patient leans: N/A  Psychiatric Specialty Exam:  Presentation  General Appearance: Appropriate for Environment; Fairly Groomed  Eye Contact:Good  Speech:Clear and Coherent  Speech Volume:Decreased  Handedness:Right  Mood and Affect  Mood:Depressed; Hopeless; Irritable; Worthless; Anxious  Affect:Flat  Thought Process  Thought Processes:Coherent  Descriptions of Associations:Intact  Orientation:Full (Time, Place and Person)  Thought Content:Logical  History of Schizophrenia/Schizoaffective disorder:No  Duration of Psychotic Symptoms:N/A  Hallucinations:Hallucinations: Auditory; Visual  Ideas of Reference:None  Suicidal Thoughts:Suicidal Thoughts: No  Homicidal Thoughts:Homicidal Thoughts: No  Sensorium  Memory:Immediate Good  Judgment:Fair  Insight:Fair  Executive Functions  Concentration:Fair  Attention Span:Fair  [place]  Psychomotor Activity  Psychomotor Activity:Psychomotor Activity: Normal  Assets  Assets:Desire for Improvement; Communication Skills  Sleep  Sleep:Sleep: Poor  Physical Exam: Physical Exam Constitutional:      General:  He is not in acute distress. HENT:     Head: Normocephalic.     Right Ear: There is no impacted cerumen.     Nose: No congestion or rhinorrhea.     Mouth/Throat:     Pharynx: No oropharyngeal exudate  or posterior oropharyngeal erythema.  Eyes:     General:        Right eye: No discharge.  Cardiovascular:     Heart sounds: No murmur heard.   No gallop.  Pulmonary:     Effort: No respiratory distress.  Musculoskeletal:     Cervical back: No rigidity.  Neurological:     Mental Status: He is alert and oriented to person, place, and time.     Sensory: No sensory deficit.     Coordination: Coordination normal.  Psychiatric:        Behavior: Behavior normal.        Thought Content: Thought content normal.   Review of Systems  Constitutional: Negative.  Negative for chills, fever, malaise/fatigue and weight loss.  HENT: Negative.  Negative for congestion, nosebleeds and sore throat.   Eyes: Negative.   Cardiovascular:  Negative for chest pain, palpitations and orthopnea.  Gastrointestinal: Negative.  Negative for abdominal pain, constipation, diarrhea, heartburn, nausea and vomiting.  Genitourinary: Negative.   Musculoskeletal: Negative.  Negative for back pain, joint pain, myalgias and neck pain.  Skin: Negative.   Neurological: Negative.  Negative for dizziness, tingling, tremors, sensory change, speech change, focal weakness, weakness and headaches.  Endo/Heme/Allergies: Negative.   Psychiatric/Behavioral:  Positive for depression, hallucinations and substance abuse. Negative for suicidal ideas (had suicidal ideations prior to admission with a plan to jump off a bridge). The patient is nervous/anxious and has insomnia.   Blood pressure (!) 149/95, pulse 88, temperature 98.5 F (36.9 C), temperature source Oral, resp. rate 18, height 5\' 8"  (1.727 m), weight 84.4 kg, SpO2 98 %. Body mass index is 28.28 kg/m.   COGNITIVE FEATURES THAT CONTRIBUTE TO RISK:  None    SUICIDE RISK:    Moderate:  Frequent suicidal ideation with limited intensity, and duration, some specificity in terms of plans, no associated intent, good self-control, limited dysphoria/symptomatology, some risk factors present, and identifiable protective factors, including available and accessible social support.   PLAN Safety and Monitoring: Voluntary admission to inpatient psychiatric unit for safety, stabilization and treatment Daily contact with patient to assess and evaluate symptoms and progress in treatment Patient's case to be discussed in multi-disciplinary team meeting Observation Level : q15 minute checks Vital signs: q12 hours Precautions: suicide   Physician Treatment Plan for Secondary Diagnosis:  Principal Problem:   MDD (major depressive disorder) Active Problems:   Cocaine use disorder, severe, dependence (HCC)   Suicidal ideation   Moderate cannabis use disorder (HCC)   GAD (generalized anxiety disorder)   Long Term Goal(s): Improvement in symptoms so as ready for discharge   Short Term Goals: Ability to identify and develop effective coping behaviors will improve, Ability to maintain clinical measurements within normal limits will improve, Compliance with prescribed medications will improve, and Ability to identify triggers associated with substance abuse/mental health issues will improve. Ability to identify changes in lifestyle to reduce recurrence of condition will improve, Ability to verbalize feelings will improve, and Ability to disclose and discuss suicidal ideas.   Medications Principal Problem: MDD (major depressive disorder) -Start Wellbutrin XL 150 mg daily   Anxiety -Start Sertraline (25mg  given today, will start 50 mg daily on 03/14/21 -Continue Atarax 25 mg TID PRN   Insomnia Start Trazodone 50 mg nightly PRN   Medications for other medical reasons -Continue Atorvastatin 80 mg daily for hypercholesteremia -Continue Hydrochlorothiazide 25 mg for  hypertension -Continue Lisinopril 20 mg daily for hypertension -Continue Hydrochlorothiazide 25 mg daily for hypertension  PRNS -Continue Tylenol 650 mg every 6 hours PRN for mild pain -Continue Maalox 30 mg every 4 hrs PRN for indigestion -Continue Milk of Magnesia as needed every 6 hrs for constipation   Discharge Planning: Social work and case management to assist with discharge planning and identification of hospital follow-up needs prior to discharge Estimated LOS: 5-7 days Discharge Concerns: Need to establish a safety plan; Medication compliance and effectiveness Discharge Goals: Return home with outpatient referrals for mental health follow-up including medication management/psychotherapy  I certify that inpatient services furnished can reasonably be expected to improve the patient's condition.   Nicholes Rough, NP 03/13/2021, 5:17 PM  Total Time Spent in Direct Patient Care:  I personally spent 60 minutes on the unit in direct patient care. The direct patient care time included face-to-face time with the patient, reviewing the patient's chart, communicating with other professionals, and coordinating care. Greater than 50% of this time was spent in counseling or coordinating care with the patient regarding goals of hospitalization, psycho-education, and discharge planning needs.  I have independently evaluated the patient during a face-to-face assessment on 03/13/21. I reviewed the patient's chart, and I participated in key portions of the service. I discussed the case with the APP, and I agree with the assessment and plan of care as documented in the APP's note , as addended by me or notated below:  Edits to H&P: On my exam, the pt reports that he continues to have suicidal thoughts that are passive without intent or plan, and that he is able to contract for safety on the unit.    Pt reports that when he was prescribed wellbutrin previously, that he felt less depressed, less  sad, more motivated, and had more energy. Pt is willing to restart this medication here. Pt reports that anxiety levels are very high, and we discussed starting buspar vs zoloft for this, pt decided to start zoloft, as this is also an antidepressant and his depression is severe.    His insight is poor. His judgement is fair.   Janine Limbo, MD Psychiatrist

## 2021-03-13 NOTE — Plan of Care (Signed)
°  Problem: Education: Goal: Emotional status will improve Outcome: Not Progressing Goal: Mental status will improve Outcome: Not Progressing   Problem: Health Behavior/Discharge Planning: Goal: Compliance with treatment plan for underlying cause of condition will improve Outcome: Not Progressing

## 2021-03-13 NOTE — Progress Notes (Signed)
° °  Contacted provider on call concerning the following:     Writer Request:  There is an order for me to do CBG tonight. It was 302, but I don't see any order following for insulin until morning.   Provider Response as follows:   Yes it does not look if anything else was ordered, will monitor for tonight and they can adjust tomorrow unless he is having some symptoms.  Writer Response:  No symptoms with blood sugar, said he had a piece of pie. Will continue to monitor and report any changes.  Provider Response:  Ok Thanks

## 2021-03-13 NOTE — Progress Notes (Addendum)
On assessment, pt presents with moderate anxiety and depression.  Pt reported pain 4/10; rest and heat pack offered for additional comfort.  Pt observed in milieu and in attendance at wrap up group.  Pt endorses passive SI, but verbally contracts for safety.  Pt denies HI and AVH.    Medication administered.  PRN Hydroxyzine and Trazodone administered for anxiety and sleep per [facility] per pt request.  Pt is safe on unit with Q 15 minute safety checks.    03/12/21 2127  Psych Admission Type (Psych Patients Only)  Admission Status Voluntary  Psychosocial Assessment  Patient Complaints Anxiety;Depression  Eye Contact Fair  Facial Expression Anxious  Affect Depressed;Anxious  Speech Logical/coherent  Interaction Assertive  Motor Activity Other (Comment) (wdl)  Appearance/Hygiene Unremarkable  Behavior Characteristics Cooperative;Anxious  Mood Depressed;Anxious  Thought Process  Coherency WDL  Content WDL  Delusions None reported or observed  Perception WDL  Hallucination None reported or observed  Judgment WDL  Confusion WDL  Danger to Self  Current suicidal ideation? Passive  Self-Injurious Behavior No self-injurious ideation or behavior indicators observed or expressed   Agreement Not to Harm Self Yes  Description of Agreement verbal contract for safety  Danger to Others  Danger to Others None reported or observed

## 2021-03-14 LAB — GLUCOSE, CAPILLARY
Glucose-Capillary: 279 mg/dL — ABNORMAL HIGH (ref 70–99)
Glucose-Capillary: 248 mg/dL — ABNORMAL HIGH (ref 70–99)
Glucose-Capillary: 311 mg/dL — ABNORMAL HIGH (ref 70–99)
Glucose-Capillary: 291 mg/dL — ABNORMAL HIGH (ref 70–99)

## 2021-03-14 LAB — URINALYSIS, ROUTINE W REFLEX MICROSCOPIC
Bilirubin Urine: NEGATIVE
Glucose, UA: >=500 mg/dL — AB
Hgb urine dipstick: NEGATIVE
Ketones, ur: NEGATIVE mg/dL
Leukocytes,Ua: NEGATIVE
Nitrite: NEGATIVE
Protein, ur: NEGATIVE mg/dL
Specific Gravity, Urine: 1.02 (ref 1.005–1.030)
pH: 5 (ref 5.0–8.0)

## 2021-03-14 LAB — TSH: TSH: 2.395 u[IU]/mL (ref 0.350–4.500)

## 2021-03-14 LAB — RPR: RPR Ser Ql: NONREACTIVE

## 2021-03-14 MED ORDER — INSULIN GLARGINE-YFGN 100 UNIT/ML ~~LOC~~ SOLN
5.0000 [IU] | Freq: Once | SUBCUTANEOUS | Status: AC
  Administered 2021-03-14: 5 [IU] via SUBCUTANEOUS

## 2021-03-14 MED ORDER — INSULIN GLARGINE-YFGN 100 UNIT/ML ~~LOC~~ SOLN
35.0000 [IU] | Freq: Every day | SUBCUTANEOUS | Status: DC
  Administered 2021-03-15 – 2021-03-16 (×2): 35 [IU] via SUBCUTANEOUS

## 2021-03-14 NOTE — Progress Notes (Signed)
D:  Patient's self inventory sheet, patient has poor sleep, sleep medication was not helpful.  Good appetite, low energy level, poor concentration.  Rated depression and anxiety 8, hopeless 9.  Denied withdrawals.  Denied SI.  Physical problems. Pain, R shoulder, worst pain #7 in past 24 hours.  Pain medicine is helpful.  Goal is meditation.  Plans to take action.  No discharge plans.  "No place to stay, no job." A:  Medications administered per MD orders.  Emotional support and encouragement given patient. R:  Denied SI and HI, contracts for safety.  Denied A/V hallucinations.  Safety maintained with 15 minute checks.

## 2021-03-14 NOTE — Group Note (Addendum)
LCSW Group Therapy Note ° ° °Group Date: 03/13/2021 °Start Time: 1300 °End Time: 1400 ° ° °Type of Therapy and Topic:  Group Therapy:  ° °Participation Level:  Active ° °Description of Group: Coping Skills Packet ° ° °Summary of Patient Progress:  CSW was unable to hold group due to low staffing. Pt was given packet and encouraged to meet with CSW individually if questions arose.  ° °Travius Crochet M Khandi Kernes, LCSWA °03/14/2021  1:01 PM   ° °

## 2021-03-14 NOTE — [facility] Group Notes (Signed)
Pt encouraged but did not attend orientation group.  °

## 2021-03-14 NOTE — [facility] Suicide Risk Assessment (Signed)
[place] INPATIENT:  Family/Significant Other Suicide Prevention Education  Suicide Prevention Education:  Patient Refusal for Family/Significant Other Suicide Prevention Education: The patient Troy Gordon has refused to provide written consent for family/significant other to be provided Family/Significant Other Suicide Prevention Education during admission and/or prior to discharge.  Physician notified.  Mliss Fritz 03/14/2021, 10:13 AM

## 2021-03-14 NOTE — Progress Notes (Signed)
[facility] Group Notes:  (Nursing/MHT/Case Management/Adjunct)  Date:  03/14/2021  Time:  2015  Type of Therapy:   wrap up group  Participation Level:  Active  Participation Quality:  Appropriate, Attentive, Sharing, and Supportive  Affect:  Depressed  Cognitive:  Alert  Insight:  Improving  Engagement in Group:  Engaged  Modes of Intervention:  Clarification, Education, and Socialization  Summary of Progress/Problems: Positive thinking and self-care were discussed.   Marcille Buffy 03/14/2021, 8:53 PM

## 2021-03-14 NOTE — Progress Notes (Signed)
°   03/14/21 2134  Psych Admission Type (Psych Patients Only)  Admission Status Voluntary  Psychosocial Assessment  Patient Complaints Anxiety;Depression;Sadness  Eye Contact Fair  Facial Expression Sullen;Pensive  Affect Appropriate to circumstance  Speech Logical/coherent  Interaction Assertive  Motor Activity Other (Comment) (WDL)  Appearance/Hygiene Unremarkable  Behavior Characteristics Cooperative;Appropriate to situation  Mood Depressed;Sad  Thought Process  Coherency WDL  Content WDL  Delusions None reported or observed  Perception WDL  Hallucination None reported or observed  Judgment WDL  Confusion None  Danger to Self  Current suicidal ideation? Denies  Self-Injurious Behavior No self-injurious ideation or behavior indicators observed or expressed   Agreement Not to Harm Self Yes  Description of Agreement Verbal contract  Danger to Others  Danger to Others None reported or observed

## 2021-03-14 NOTE — Progress Notes (Signed)
Morning BP abnormal:  BP 174/103, Pulse 66  BP 159/102, Pulse 79  Writer  Rechecked BP  BP 149/81, Pulse 66   Will notify day RN.
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
## 2021-03-14 NOTE — Progress Notes (Addendum)
[facility] MD Progress Note  03/14/2021 3:51 PM Troy Gordon  MRN:  [id number]  Principal Problem: MDD (major depressive disorder) Diagnosis: Principal Problem:   MDD (major depressive disorder) Active Problems:   Cocaine use disorder, severe, dependence ([employer])   Suicidal ideation   Moderate cannabis use disorder (HCC)   GAD (generalized anxiety disorder)  History of Present Illness: Troy Gordon is a 56 year old male with a past history of MDD, cocaine & cannabis abuse, hypertension and Diabetes, who walked into [facility] ED with complaints of worsening suicidal ideations x 1 week, with a plan to jump off a bridge or into traffic. Pt was transferred to this [facility]) for treatment and stabilization of his mood.  Yesterday's recommendation by the Psychiatry team Start Wellbutrin XL 150 mg daily -Start Sertraline (25mg  given today, will start 50 mg daily on 03/14/21 -Continue Atarax 25 mg TID PRN  Start Trazodone 50 mg nightly PRN  Medications for other medical reasons -Continue Atorvastatin 80 mg daily for hypercholesteremia -Continue Hydrochlorothiazide 25 mg for hypertension -Continue Lisinopril 20 mg daily for hypertension -Continue Hydrochlorothiazide 25 mg daily for hypertension   Today's Evaluation (03/14/2021) Pt calm and cooperative during this encounter, denies SI/HI/AVH, denies paranoia, and there are currently no delusional thoughts. Pt with good eye contact, attention to personal hygiene and grooming is fair, speech is clear and coherent, and thought contents are logical and organized.   Pt reports feeling tired, states that he did not have a good night's sleep last night. Offers made to increase his Trazodone to 100 mg, but pt declined stating that his body "just has to get used to it, because I used to work second shift, and was used to sleeping during the day and being up at night". Pt educated to let staff know if he changes his mind, and  verbalizes understanding. Pt reports a good appetite, reports a decreased energy level, and continues to report his mood as "sad", and "depressed", but "getting better". Pt denies having any side effects to his current medications. Pt rates his anxiety and 4 (10 being the worst), and reports right shoulder pain of 7 (10 being worst), and states that the Ibuprofen being given to him is helping with the pain. Pt reports that he sustained a fall 5 months ago and fell on the right side, which had led to pain to this side since the fall.   Current vital signs [id number] pressure (!) 149/81, pulse 66, temperature 97.8 F (36.6 C), temperature source Oral, resp. rate 16, height 5\' 8"  (1.727 m), weight 84.4 kg, SpO2 98 %. Pt currently on Lisinopril 20 mg and Hydrochlorothiazide 25 mg for BP control. Capillary Blood glucose this morning was 292, and in the afternoon was 248. Pt is currently on scheduled Insulin Novolog 6 units TID with meals as per recommendations from the Diabetes consultant, and receiving insulin coverage on a sliding scale. Pt is also on Insulin Glargine 30 units daily.  Past Psychiatric History: Please see below  Past Medical History:  Past Medical History:  Diagnosis Date   Depression    Diabetes mellitus ([place])    Dyslipidemia    Hyperlipidemia    Hypertension     Past Surgical History:  Procedure Laterality Date   CIRCUMCISION     as an adult   Family History:  Family History  Problem Relation Age of Onset   Diabetes Maternal Grandmother    Hypertension Maternal Grandmother    Hypertension Father  Family Psychiatric  History: See above Social History:  Social History   Substance and Sexual Activity  Alcohol Use Not Currently   Comment: occasionally beer     Social History   Substance and Sexual Activity  Drug Use Yes   Types: Marijuana, Cocaine   Comment: last used crack 06/07/20    Social History   Socioeconomic History   Marital status: Single    Spouse  name: Not on file   Number of children: Not on file   Years of education: Not on file   Highest education level: Not on file  Occupational History   Not on file  Tobacco Use   Smoking status: Former    Years: 1.00    Types: Cigarettes   Smokeless tobacco: Never   Tobacco comments:    1 cig every 5 weeks  Vaping Use   Vaping Use: Never used  Substance and Sexual Activity   Alcohol use: Not Currently    Comment: occasionally beer   Drug use: Yes    Types: Marijuana, Cocaine    Comment: last used crack 06/07/20   Sexual activity: Not on file  Other Topics Concern   Not on file  Social History Narrative   Not on file   Social Determinants of Health   Financial Resource Strain: Not on file  Food Insecurity: Not on file  Transportation Needs: Not on file  Physical Activity: Not on file  Stress: Not on file  Social Connections: Not on file   Sleep: Poor  Appetite:  Good  Current Medications: Current Facility-Administered Medications  Medication Dose Route Frequency Provider Last Rate Last Admin   acetaminophen (TYLENOL) tablet 650 mg  650 mg Oral Q6H PRN Rankin, Shuvon B, NP   650 mg at 03/13/21 1417   alum & mag hydroxide-simeth (MAALOX/MYLANTA) 200-200-20 MG/5ML suspension 30 mL  30 mL Oral Q4H PRN Rankin, Shuvon B, NP       atorvastatin (LIPITOR) tablet 80 mg  80 mg Oral Daily Rankin, Shuvon B, NP   80 mg at 03/14/21 [id number]   buPROPion (WELLBUTRIN XL) 24 hr tablet 150 mg  150 mg Oral Daily Rankin, Shuvon B, NP   150 mg at 03/14/21 [id number]   lisinopril (ZESTRIL) tablet 20 mg  20 mg Oral Daily Hill, Jackie Plum, MD   20 mg at 03/14/21 [id number]   And   hydrochlorothiazide (HYDRODIURIL) tablet 25 mg  25 mg Oral Daily Hill, Jackie Plum, MD   25 mg at 03/14/21 [id number]   hydrOXYzine (ATARAX) tablet 25 mg  25 mg Oral TID PRN Bobbitt, Shalon E, NP   25 mg at 03/13/21 2119   ibuprofen (ADVIL) tablet 400 mg  400 mg Oral Q6H PRN Nicholes Rough, NP   400 mg at 03/14/21 [id number]   insulin  aspart (novoLOG) injection 0-5 Units  0-5 Units Subcutaneous QHS Massengill, Ovid Curd, MD   3 Units at 03/13/21 2153   insulin aspart (novoLOG) injection 0-9 Units  0-9 Units Subcutaneous TID WC Massengill, Ovid Curd, MD   3 Units at 03/14/21 1213   insulin aspart (novoLOG) injection 6 Units  6 Units Subcutaneous TID WC Trudee Chirino, Tamela Oddi, NP   6 Units at 03/14/21 1218   insulin glargine-yfgn (SEMGLEE) injection 30 Units  30 Units Subcutaneous Daily Rankin, Shuvon B, NP   30 Units at 03/14/21 [id number]   magnesium hydroxide (MILK OF MAGNESIA) suspension 30 mL  30 mL Oral Daily PRN Rankin, Shuvon B, NP       sertraline (  ZOLOFT) tablet 50 mg  50 mg Oral Daily Nicholes Rough, NP   50 mg at 03/14/21 [id number]   traZODone (DESYREL) tablet 50 mg  50 mg Oral QHS PRN Rankin, Shuvon B, NP   50 mg at 03/13/21 2119    Lab Results:  Results for orders placed or performed during the hospital encounter of 03/12/21 (from the past 48 hour(s))  Glucose, capillary     Status: Abnormal   Collection Time: 03/12/21  5:26 PM  Result Value Ref Range   Glucose-Capillary 258 (H) 70 - 99 mg/dL    Comment: Glucose reference range applies only to samples taken after fasting for at least 8 hours.   Comment 1 Notify RN    Comment 2 Document in Chart   Glucose, capillary     Status: Abnormal   Collection Time: 03/12/21  7:58 PM  Result Value Ref Range   Glucose-Capillary 302 (H) 70 - 99 mg/dL    Comment: Glucose reference range applies only to samples taken after fasting for at least 8 hours.  Glucose, capillary     Status: Abnormal   Collection Time: 03/13/21  5:54 AM  Result Value Ref Range   Glucose-Capillary 221 (H) 70 - 99 mg/dL    Comment: Glucose reference range applies only to samples taken after fasting for at least 8 hours.  Hemoglobin A1c     Status: Abnormal   Collection Time: 03/13/21  6:25 AM  Result Value Ref Range   Hgb A1c MFr Bld 10.7 (H) 4.8 - 5.6 %    Comment: (NOTE) Pre diabetes:          5.7%-6.4%  Diabetes:               >6.4%  Glycemic control for   <7.0% adults with diabetes    Mean Plasma Glucose 260.39 mg/dL    Comment: Performed at [facility] [address] [place], [place] [address]  Magnesium     Status: None   Collection Time: 03/13/21  6:25 AM  Result Value Ref Range   Magnesium 2.0 1.7 - 2.4 mg/dL    Comment: Performed at [facility], [place] [address], [place], [place] [address]  RPR     Status: None   Collection Time: 03/13/21  6:25 AM  Result Value Ref Range   RPR Ser Ql NON REACTIVE NON REACTIVE    Comment: Performed at [facility], [place] [address] [place], [place] [address]  VITAMIN D 25 Hydroxy (Vit-D Deficiency, Fractures)     Status: None   Collection Time: 03/13/21  6:25 AM  Result Value Ref Range   Vit D, 25-Hydroxy 41.87 30 - 100 ng/mL    Comment: (NOTE) Vitamin D deficiency has been defined by the [place] practice guideline as a level of serum 25-OH  vitamin D less than 20 ng/mL (1,2). The Endocrine Society went on to  further define vitamin D insufficiency as a level between 21 and 29  ng/mL (2).  1. IOM (Institute of Medicine). 2010. Dietary reference intakes for  calcium and D. [place]: The [employer]. 2. Holick MF, Binkley Winston, Bischoff-Ferrari HA, et al. Evaluation,  treatment, and prevention of vitamin D deficiency: an Endocrine  Society clinical practice guideline, JCEM. 2011 Jul; 96(7): 1911-30.  Performed at [facility], [place] [address], [place],  [address]   Vitamin B12     Status: None  Collection Time: 03/13/21  6:25 AM  Result Value Ref Range   Vitamin B-12 473 180 - 914 pg/mL    Comment: (NOTE) This assay is not validated for testing neonatal or myeloproliferative syndrome specimens for Vitamin B12 levels. Performed at [facility], [place] [address] [place], [place] [address]   Lipid panel     Status: Abnormal    Collection Time: 03/13/21  6:25 AM  Result Value Ref Range   Cholesterol 274 (H) 0 - 200 mg/dL   Triglycerides 390 (H) <150 mg/dL   HDL 45 >40 mg/dL   Total CHOL/HDL Ratio 6.1 RATIO   VLDL 78 (H) 0 - 40 mg/dL   LDL Cholesterol 151 (H) 0 - 99 mg/dL    Comment:        Total Cholesterol/HDL:CHD Risk Coronary Heart Disease Risk Table                     Men   Women  1/2 Average Risk   3.4   3.3  Average Risk       5.0   4.4  2 X Average Risk   9.6   7.1  3 X Average Risk  23.4   11.0        Use the calculated Patient Ratio above and the CHD Risk Table to determine the patient's CHD Risk.        ATP III CLASSIFICATION (LDL):  <100     mg/dL   Optimal  100-129  mg/dL   Near or Above                    Optimal  130-159  mg/dL   Borderline  160-189  mg/dL   High  >190     mg/dL   Very High Performed at [facility] [address], [place], [place] [address]   Glucose, capillary     Status: Abnormal   Collection Time: 03/13/21 11:50 AM  Result Value Ref Range   Glucose-Capillary 258 (H) 70 - 99 mg/dL    Comment: Glucose reference range applies only to samples taken after fasting for at least 8 hours.   Comment 1 Notify RN   Glucose, capillary     Status: Abnormal   Collection Time: 03/13/21  4:49 PM  Result Value Ref Range   Glucose-Capillary 265 (H) 70 - 99 mg/dL    Comment: Glucose reference range applies only to samples taken after fasting for at least 8 hours.  Urinalysis, Routine w reflex microscopic     Status: Abnormal   Collection Time: 03/13/21  7:08 PM  Result Value Ref Range   Color, Urine STRAW (A) YELLOW   APPearance CLEAR CLEAR   Specific Gravity, Urine 1.020 1.005 - 1.030   pH 5.0 5.0 - 8.0   Glucose, UA >=500 (A) NEGATIVE mg/dL   Hgb urine dipstick NEGATIVE NEGATIVE   Bilirubin Urine NEGATIVE NEGATIVE   Ketones, ur NEGATIVE NEGATIVE mg/dL   Protein, ur NEGATIVE NEGATIVE mg/dL   Nitrite NEGATIVE NEGATIVE   Leukocytes,Ua NEGATIVE NEGATIVE    WBC, UA 0-5 0 - 5 WBC/hpf   Bacteria, UA RARE (A) NONE SEEN   Mucus PRESENT     Comment: Performed at [facility], [place] [address] [place], [place] [address]  Glucose, capillary     Status: Abnormal   Collection Time: 03/13/21  9:27 PM  Result Value Ref Range   Glucose-Capillary 292 (H) 70 - 99  mg/dL    Comment: Glucose reference range applies only to samples taken after fasting for at least 8 hours.  TSH     Status: None   Collection Time: 03/14/21  6:33 AM  Result Value Ref Range   TSH 2.395 0.350 - 4.500 uIU/mL    Comment: Performed by a 3rd Generation assay with a functional sensitivity of <=0.01 uIU/mL. Performed at [facility], [place] [address], [place], [place] [address]   Glucose, capillary     Status: Abnormal   Collection Time: 03/14/21 11:59 AM  Result Value Ref Range   Glucose-Capillary 248 (H) 70 - 99 mg/dL    Comment: Glucose reference range applies only to samples taken after fasting for at least 8 hours.   Comment 1 Notify RN     Blood Alcohol level:  Lab Results  Component Value Date   ETH <10 03/11/2021   ETH <10 [id number]    Metabolic Disorder Labs: Lab Results  Component Value Date   HGBA1C 10.7 (H) 03/13/2021   MPG 260.39 03/13/2021   MPG 277.61 10/24/2020   No results found for: PROLACTIN Lab Results  Component Value Date   CHOL 274 (H) 03/13/2021   TRIG 390 (H) 03/13/2021   HDL 45 03/13/2021   CHOLHDL 6.1 03/13/2021   VLDL 78 (H) 03/13/2021   LDLCALC 151 (H) 03/13/2021   LDLCALC 222 (H) 10/26/2020   Physical Findings: AIMS: N/A CIWA:  N/A COWS:  N/A  Musculoskeletal: Strength & Muscle Tone: within normal limits Gait & Station: normal Patient leans: N/A  Psychiatric Specialty Exam:  Presentation  General Appearance: Appropriate for Environment; Fairly Groomed  Eye Contact:Fair  Speech:Clear and Coherent  Speech Volume:Normal  Handedness:Right  Mood and Affect  Mood:Anxious;  Depressed  Affect:Appropriate  Thought Process  Thought Processes:Coherent  Descriptions of Associations:Intact  Orientation:Full (Time, Place and Person)  Thought Content:Logical  History of Schizophrenia/Schizoaffective disorder:No  Duration of Psychotic Symptoms:N/A  Hallucinations:Hallucinations: None  Ideas of Reference:None  Suicidal Thoughts:Suicidal Thoughts: No  Homicidal Thoughts:Homicidal Thoughts: No  Sensorium  Memory:Immediate Good  Judgment:Fair  Insight:Fair  Executive Functions  Concentration:Fair  Attention Span:Fair  [place]  Psychomotor Activity  Psychomotor Activity:Psychomotor Activity: Normal  Assets  Assets:Communication Skills  Sleep  Sleep:Sleep: Poor  Physical Exam: Physical Exam Constitutional:      Appearance: He is normal weight.  HENT:     Head: Normocephalic.     Right Ear: There is no impacted cerumen.     Left Ear: There is no impacted cerumen.     Nose: No congestion or rhinorrhea.     Mouth/Throat:     Pharynx: No oropharyngeal exudate or posterior oropharyngeal erythema.  Eyes:     General:        Right eye: No discharge.        Left eye: No discharge.  Cardiovascular:     Rate and Rhythm: Regular rhythm.  Pulmonary:     Effort: No respiratory distress.  Musculoskeletal:     Cervical back: No rigidity.  Neurological:     Mental Status: He is oriented to person, place, and time.  Psychiatric:        Behavior: Behavior normal.        Thought Content: Thought content normal.   Review of Systems  Constitutional: Negative.  Negative for chills, fever and malaise/fatigue.  HENT: Negative.  Negative for congestion and sore throat.   Eyes: Negative.  Negative for pain.  Respiratory:  Negative.  Negative for cough and shortness of breath.   Cardiovascular: Negative.  Negative for chest pain and palpitations.  Gastrointestinal: Negative.  Negative for constipation,  diarrhea, heartburn, nausea and vomiting.  Genitourinary: Negative.   Musculoskeletal:  Positive for joint pain (complains of right shoulder pain). Negative for back pain, myalgias and neck pain.  Skin: Negative.  Negative for itching and rash.  Neurological:  Negative for dizziness, tingling, tremors and headaches.  Psychiatric/Behavioral:  Positive for depression and substance abuse. Negative for hallucinations and suicidal ideas. The patient is nervous/anxious and has insomnia.   Blood pressure (!) 149/81, pulse 66, temperature 97.8 F (36.6 C), temperature source Oral, resp. rate 16, height 5\' 8"  (1.727 m), weight 84.4 kg, SpO2 98 %. Body mass index is 28.28 kg/m.  Treatment Plan Summary: Daily contact with patient to assess and evaluate symptoms and progress in treatment and Medication management   Observation Level/Precautions:  15 minute checks  Laboratory:  Labs reviewed  Psychotherapy:  Unit Group sessions  Medications:  See [facility]  Consultations:  To be determined   Discharge Concerns:  Safety, medication compliance, mood stability  Estimated LOS: 5-7 days  Other:  N/A    Physician Treatment Plan for Primary Diagnosis: MDD (major depressive disorder)   PLAN Safety and Monitoring: Voluntary admission to inpatient psychiatric unit for safety, stabilization and treatment Daily contact with patient to assess and evaluate symptoms and progress in treatment Patient's case to be discussed in multi-disciplinary team meeting Observation Level : q15 minute checks Vital signs: q12 hours Precautions: suicide   Medications Principal Problem: MDD (major depressive disorder) -Continue Wellbutrin XL 150 mg daily   Anxiety -Continue Sertraline 50 mg daily  -Continue Atarax 25 mg TID PRN   Insomnia Continue Trazodone 50 mg nightly PRN   Medications for other medical reasons -Continue Atorvastatin 80 mg daily for hypercholesteremia -Continue Hydrochlorothiazide 25 mg for  hypertension -Continue Lisinopril 20 mg daily for hypertension -Continue Hydrochlorothiazide 25 mg daily for hypertension     PRNS -Continue Tylenol 650 mg every 6 hours PRN for mild pain -Continue Maalox 30 mg every 4 hrs PRN for indigestion -Continue Milk of Magnesia as needed every 6 hrs for constipation   Discharge Planning: Social work and case management to assist with discharge planning and identification of hospital follow-up needs prior to discharge Estimated LOS: 5-7 days Discharge Concerns: Need to establish a safety plan; Medication compliance and effectiveness Discharge Goals: Return home with outpatient referrals for mental health follow-up including medication management/psychotherapy  Nicholes Rough, NP 03/14/2021, 3:51 PM

## 2021-03-14 NOTE — Progress Notes (Signed)
Patient stated his hands have changed since he came to [facility].  He believes that the finger sticks for his CBG are causing callouses, etc.

## 2021-03-14 NOTE — Plan of Care (Signed)
  Problem: Activity: Goal: Sleeping patterns will improve Outcome: Progressing   Problem: Education: Goal: Emotional status will improve Outcome: Not Progressing Goal: Mental status will improve Outcome: Not Progressing   

## 2021-03-14 NOTE — Progress Notes (Signed)
Inpatient Diabetes Program Recommendations  AACE/ADA: New Consensus Statement on Inpatient Glycemic Control (2015)  Target Ranges:  Prepandial:   less than 140 mg/dL      Peak postprandial:   less than 180 mg/dL (1-2 hours)      Critically ill patients:  140 - 180 mg/dL    Latest Reference Range & Units 03/13/21 05:54 03/13/21 11:50 03/13/21 16:49 03/13/21 21:27  Glucose-Capillary 70 - 99 mg/dL 407 (H) 680 (H) 881 (H) 292 (H)  (H): Data is abnormally high   Home DM Meds: Lantus 30 units daily                             Tradjenta 5 mg daily (NOT taking)   Current Orders: Novolog Sensitive Correction Scale/ SSI (0-9 units) TID AC       Novolog 6 units TID with meals                           Semglee 30 units daily      MD- Note Novolog 6 units TID with meals started this AM with Breakfast  CBG was 279 this AM per [facility] documentation  Please also consider increasing the Semglee to 35 units Daily  If 30 unit dose Semglee already given this AM, please also order Semglee 5 units X 1 dose to be given today as well     --Will follow patient during hospitalization--  Ambrose Finland RN, MSN, CDE Diabetes Coordinator Inpatient Glycemic Control Team Team Pager: [phone] (8a-5p)

## 2021-03-14 NOTE — Plan of Care (Signed)
Nurse discussed anxiety, depression and coping skills with patient.  

## 2021-03-14 NOTE — Progress Notes (Addendum)
°  On assessment, pt has complaints of high depression and mild anxiety.  Pt reports spoke with significant other and that triggered depression to increase.  Pt provided reassurance.  Pt denies SI/HI, and verbally contracts for safety.  Pt denies AVH.  Pt administered PRN Ibuprofen for pain(5/10), Trazodone for sleep, and Hydroxyzine for anxiety per [facility] per pt request.  Pt is safe on unit with Q 15 minute safety checks.    03/13/21 2119  Psych Admission Type (Psych Patients Only)  Admission Status Voluntary  Psychosocial Assessment  Patient Complaints Anxiety;Depression  Eye Contact Fair  Facial Expression Anxious  Affect Depressed;Anxious  Speech Logical/coherent  Interaction Assertive  Motor Activity Other (Comment) (wdl)  Appearance/Hygiene Unremarkable  Behavior Characteristics Cooperative  Mood Pleasant;Depressed;Anxious  Thought Process  Coherency WDL  Content WDL  Delusions None reported or observed  Perception WDL  Hallucination None reported or observed  Judgment WDL  Confusion WDL  Danger to Self  Current suicidal ideation? Passive  Self-Injurious Behavior No self-injurious ideation or behavior indicators observed or expressed   Agreement Not to Harm Self Yes  Description of Agreement verbal contract for safety  Danger to Others  Danger to Others None reported or observed

## 2021-03-15 LAB — GLUCOSE, CAPILLARY
Glucose-Capillary: 206 mg/dL — ABNORMAL HIGH (ref 70–99)
Glucose-Capillary: 154 mg/dL — ABNORMAL HIGH (ref 70–99)
Glucose-Capillary: 208 mg/dL — ABNORMAL HIGH (ref 70–99)
Glucose-Capillary: 244 mg/dL — ABNORMAL HIGH (ref 70–99)

## 2021-03-15 MED ORDER — HYDROCHLOROTHIAZIDE 12.5 MG PO TABS
37.5000 mg | ORAL_TABLET | Freq: Every day | ORAL | Status: DC
  Administered 2021-03-16: 37.5 mg via ORAL
  Filled 2021-03-15 (×3): qty 3

## 2021-03-15 MED ORDER — LISINOPRIL 20 MG PO TABS
20.0000 mg | ORAL_TABLET | Freq: Every day | ORAL | Status: DC
  Administered 2021-03-16: 20 mg via ORAL
  Filled 2021-03-15 (×3): qty 1

## 2021-03-15 MED ORDER — HYDROCHLOROTHIAZIDE 12.5 MG PO TABS
12.5000 mg | ORAL_TABLET | Freq: Once | ORAL | Status: AC
  Administered 2021-03-15: 12.5 mg via ORAL
  Filled 2021-03-15: qty 1

## 2021-03-15 MED ORDER — INSULIN ASPART 100 UNIT/ML IJ SOLN
10.0000 [IU] | Freq: Three times a day (TID) | INTRAMUSCULAR | Status: DC
  Administered 2021-03-15 – 2021-03-18 (×8): 10 [IU] via SUBCUTANEOUS

## 2021-03-15 NOTE — Progress Notes (Signed)
Pt reported that he feels depressed and anxious but pt denied SI/HI/AVH.  Pt taking medications without incident and he has been cooperative and pleasant this shift.  Pt interacting with staff and fellow peers.  RN provided support and assessed for needs and concerns.  Pt is coping well at this time and is in no acute distress.

## 2021-03-15 NOTE — Progress Notes (Signed)
[facility] MD Progress Note  03/15/2021 1:18 PM Troy Gordon  MRN:  [id number]  Principal Problem: MDD (major depressive disorder) Diagnosis: Principal Problem:   MDD (major depressive disorder) Active Problems:   Cocaine use disorder, severe, dependence ([place])   Suicidal ideation   Moderate cannabis use disorder (HCC)   GAD (generalized anxiety disorder)  History of Present Illness: Troy Gordon is a 56 year old male with a past history of MDD, cocaine & cannabis abuse, hypertension and Diabetes, who walked into [facility] ED with complaints of worsening suicidal ideations x 1 week, with a plan to jump off a bridge or into traffic. Pt was transferred to this [facility]) for treatment and stabilization of his mood.  Yesterday's recommendation by the Psychiatry team Start Wellbutrin XL 150 mg daily -Start Sertraline (25mg  given today, will start 50 mg daily on 03/14/21 -Continue Atarax 25 mg TID PRN  Start Trazodone 50 mg nightly PRN  Medications for other medical reasons -Continue Atorvastatin 80 mg daily for hypercholesteremia -Continue Hydrochlorothiazide 25 mg for hypertension -Continue Lisinopril 20 mg daily for hypertension -Continue Hydrochlorothiazide 25 mg daily for hypertension   Today's Evaluation (03/15/2021) Pt calm and cooperative during this encounter, denies SI/HI/AVH, denies paranoia, and there are currently no delusional thoughts. Pt with good eye contact, attention to personal hygiene and grooming is fair, speech is clear and coherent, and thought contents are logical and organized.   Pt reports feeling well rested this morning, states that his sleep quality last night was better than it has been. Pt reports a good appetite, reports a normal energy level today. Pt reports and improvement in his mood, today. Mood is euthymic and affect is congruent. Pt is visible on the unit and is interacting appropriately with staff and peers. Pt denies having any  side effects to his current medications. Pt denies having any anxiety today.  Pt continues to reports right shoulder pain of 7 (10 being worst), and states that the Ibuprofen being given to him is helping to relief the  pain for appropriately 3-4 hrs at a time. Pt asking to be given an Xray of the right shoulder, but educated that since it has been four months since he fell to the right side, it would be more appropriate for him to follow up outpatient with Orthopedics after discharge. Pt verbalized understanding.   Current vital signs [id number] pressure (!) 155/92, pulse 68, temperature 97.8 F (36.6 C), resp. rate 18, height 5\' 8"  (1.727 m), weight 84.4 kg, SpO2 98 %. Pt is currently on Lisinopril 20 mg and Hydrochlorothiazide 25 mg for BP control.  Will increase the dose of his Hydrochlorothiazide to 37.5 mg. Capillary Blood glucose this morning was 206, and this afternoon, it was 154, which shows an improvement in his blood glucose control on his current medications.    Past Psychiatric History: Please see below  Past Medical History:  Past Medical History:  Diagnosis Date   Depression    Diabetes mellitus ([place])    Dyslipidemia    Hyperlipidemia    Hypertension     Past Surgical History:  Procedure Laterality Date   CIRCUMCISION     as an adult   Family History:  Family History  Problem Relation Age of Onset   Diabetes Maternal Grandmother    Hypertension Maternal Grandmother    Hypertension Father    Family Psychiatric  History: See above Social History:  Social History   Substance and Sexual Activity  Alcohol Use  Not Currently   Comment: occasionally beer     Social History   Substance and Sexual Activity  Drug Use Yes   Types: Marijuana, Cocaine   Comment: last used crack 06/07/20    Social History   Socioeconomic History   Marital status: Single    Spouse name: Not on file   Number of children: Not on file   Years of education: Not on file   Highest education  level: Not on file  Occupational History   Not on file  Tobacco Use   Smoking status: Former    Years: 1.00    Types: Cigarettes   Smokeless tobacco: Never   Tobacco comments:    1 cig every 5 weeks  Vaping Use   Vaping Use: Never used  Substance and Sexual Activity   Alcohol use: Not Currently    Comment: occasionally beer   Drug use: Yes    Types: Marijuana, Cocaine    Comment: last used crack 06/07/20   Sexual activity: Not on file  Other Topics Concern   Not on file  Social History Narrative   Not on file   Social Determinants of Health   Financial Resource Strain: Not on file  Food Insecurity: Not on file  Transportation Needs: Not on file  Physical Activity: Not on file  Stress: Not on file  Social Connections: Not on file   Sleep: Poor  Appetite:  Good  Current Medications: Current Facility-Administered Medications  Medication Dose Route Frequency Provider Last Rate Last Admin   acetaminophen (TYLENOL) tablet 650 mg  650 mg Oral Q6H PRN Rankin, Shuvon B, NP   650 mg at 03/13/21 1417   alum & mag hydroxide-simeth (MAALOX/MYLANTA) 200-200-20 MG/5ML suspension 30 mL  30 mL Oral Q4H PRN Rankin, Shuvon B, NP       atorvastatin (LIPITOR) tablet 80 mg  80 mg Oral Daily Rankin, Shuvon B, NP   80 mg at 03/15/21 [id number]   buPROPion (WELLBUTRIN XL) 24 hr tablet 150 mg  150 mg Oral Daily Rankin, Shuvon B, NP   150 mg at 03/15/21 [id number]   hydrochlorothiazide (HYDRODIURIL) tablet 12.5 mg  12.5 mg Oral Once Nicholes Rough, NP       [START ON 03/16/2021] lisinopril (ZESTRIL) tablet 20 mg  20 mg Oral Daily Lakshya Mcgillicuddy, Tamela Oddi, NP       And   [START ON 03/16/2021] hydrochlorothiazide (HYDRODIURIL) tablet 37.5 mg  37.5 mg Oral Daily Orel Hord, NP       hydrOXYzine (ATARAX) tablet 25 mg  25 mg Oral TID PRN Bobbitt, Shalon E, NP   25 mg at 03/15/21 0922   ibuprofen (ADVIL) tablet 400 mg  400 mg Oral Q6H PRN Nicholes Rough, NP   400 mg at 03/15/21 [id number]   insulin aspart (novoLOG) injection  0-5 Units  0-5 Units Subcutaneous QHS Massengill, Ovid Curd, MD   3 Units at 03/14/21 2135   insulin aspart (novoLOG) injection 0-9 Units  0-9 Units Subcutaneous TID WC Massengill, Ovid Curd, MD   2 Units at 03/15/21 1217   insulin aspart (novoLOG) injection 10 Units  10 Units Subcutaneous TID WC Massengill, Nathan, MD       insulin glargine-yfgn (SEMGLEE) injection 35 Units  35 Units Subcutaneous Daily Massengill, Nathan, MD   35 Units at 03/15/21 0919   magnesium hydroxide (MILK OF MAGNESIA) suspension 30 mL  30 mL Oral Daily PRN Rankin, Shuvon B, NP       sertraline (ZOLOFT) tablet 50 mg  50 mg  Oral Daily Nicholes Rough, NP   50 mg at 03/15/21 [id number]   traZODone (DESYREL) tablet 50 mg  50 mg Oral QHS PRN Rankin, Shuvon B, NP   50 mg at 03/14/21 2134    Lab Results:  Results for orders placed or performed during the hospital encounter of 03/12/21 (from the past 48 hour(s))  Glucose, capillary     Status: Abnormal   Collection Time: 03/13/21  4:49 PM  Result Value Ref Range   Glucose-Capillary 265 (H) 70 - 99 mg/dL    Comment: Glucose reference range applies only to samples taken after fasting for at least 8 hours.  Urinalysis, Routine w reflex microscopic     Status: Abnormal   Collection Time: 03/13/21  7:08 PM  Result Value Ref Range   Color, Urine STRAW (A) YELLOW   APPearance CLEAR CLEAR   Specific Gravity, Urine 1.020 1.005 - 1.030   pH 5.0 5.0 - 8.0   Glucose, UA >=500 (A) NEGATIVE mg/dL   Hgb urine dipstick NEGATIVE NEGATIVE   Bilirubin Urine NEGATIVE NEGATIVE   Ketones, ur NEGATIVE NEGATIVE mg/dL   Protein, ur NEGATIVE NEGATIVE mg/dL   Nitrite NEGATIVE NEGATIVE   Leukocytes,Ua NEGATIVE NEGATIVE   WBC, UA 0-5 0 - 5 WBC/hpf   Bacteria, UA RARE (A) NONE SEEN   Mucus PRESENT     Comment: Performed at [facility], [place] [address] [place], [place] [address]  Glucose, capillary     Status: Abnormal   Collection Time: 03/13/21  9:27 PM  Result Value Ref Range    Glucose-Capillary 292 (H) 70 - 99 mg/dL    Comment: Glucose reference range applies only to samples taken after fasting for at least 8 hours.  TSH     Status: None   Collection Time: 03/14/21  6:33 AM  Result Value Ref Range   TSH 2.395 0.350 - 4.500 uIU/mL    Comment: Performed by a 3rd Generation assay with a functional sensitivity of <=0.01 uIU/mL. Performed at [facility], [place] [address] [place], [place] [address]   Glucose, capillary     Status: Abnormal   Collection Time: 03/14/21  6:40 AM  Result Value Ref Range   Glucose-Capillary 279 (H) 70 - 99 mg/dL    Comment: Glucose reference range applies only to samples taken after fasting for at least 8 hours.  Glucose, capillary     Status: Abnormal   Collection Time: 03/14/21 11:59 AM  Result Value Ref Range   Glucose-Capillary 248 (H) 70 - 99 mg/dL    Comment: Glucose reference range applies only to samples taken after fasting for at least 8 hours.   Comment 1 Notify RN   Glucose, capillary     Status: Abnormal   Collection Time: 03/14/21  4:46 PM  Result Value Ref Range   Glucose-Capillary 311 (H) 70 - 99 mg/dL    Comment: Glucose reference range applies only to samples taken after fasting for at least 8 hours.   Comment 1 Notify RN   Glucose, capillary     Status: Abnormal   Collection Time: 03/14/21  8:42 PM  Result Value Ref Range   Glucose-Capillary 291 (H) 70 - 99 mg/dL    Comment: Glucose reference range applies only to samples taken after fasting for at least 8 hours.   Comment 1 Notify RN    Comment 2 Document in Chart   Glucose, capillary     Status: Abnormal   Collection Time: 03/15/21  5:59 AM  Result Value Ref Range   Glucose-Capillary 206 (H) 70 - 99 mg/dL    Comment: Glucose reference range applies only to samples taken after fasting for at least 8 hours.   Comment 1 Notify RN    Comment 2 Document in Chart   Glucose, capillary     Status: Abnormal   Collection Time: 03/15/21 12:00 PM   Result Value Ref Range   Glucose-Capillary 154 (H) 70 - 99 mg/dL    Comment: Glucose reference range applies only to samples taken after fasting for at least 8 hours.   Comment 1 Notify RN     Blood Alcohol level:  Lab Results  Component Value Date   ETH <10 03/11/2021   ETH <10 [id number]    Metabolic Disorder Labs: Lab Results  Component Value Date   HGBA1C 10.7 (H) 03/13/2021   MPG 260.39 03/13/2021   MPG 277.61 10/24/2020   No results found for: PROLACTIN Lab Results  Component Value Date   CHOL 274 (H) 03/13/2021   TRIG 390 (H) 03/13/2021   HDL 45 03/13/2021   CHOLHDL 6.1 03/13/2021   VLDL 78 (H) 03/13/2021   LDLCALC 151 (H) 03/13/2021   LDLCALC 222 (H) 10/26/2020   Physical Findings: AIMS: N/A CIWA:  N/A COWS:  N/A  Musculoskeletal: Strength & Muscle Tone: within normal limits Gait & Station: normal Patient leans: N/A  Psychiatric Specialty Exam:  Presentation  General Appearance: Appropriate for Environment; Fairly Groomed  Eye Contact:Fair  Speech:Clear and Coherent  Speech Volume:Normal  Handedness:Right  Mood and Affect  Mood:Euthymic  Affect:Appropriate  Thought Process  Thought Processes:Coherent  Descriptions of Associations:Intact  Orientation:Full (Time, Place and Person)  Thought Content:Logical  History of Schizophrenia/Schizoaffective disorder:No  Duration of Psychotic Symptoms:N/A  Hallucinations:Hallucinations: None  Ideas of Reference:None  Suicidal Thoughts:Suicidal Thoughts: No  Homicidal Thoughts:Homicidal Thoughts: No  Sensorium  Memory:Immediate Good  Judgment:Good  Insight:Good  Executive Functions  Concentration:Good  Attention Span:Good  [place] of Knowledge:Good  Language:Good  Psychomotor Activity  Psychomotor Activity:Psychomotor Activity: Normal  Assets  Assets:Communication Skills; Desire for Improvement  Sleep  Sleep:Sleep: Good  Physical Exam: Physical  Exam Constitutional:      Appearance: He is normal weight.  HENT:     Head: Normocephalic.     Right Ear: There is no impacted cerumen.     Left Ear: There is no impacted cerumen.     Nose: No congestion or rhinorrhea.     Mouth/Throat:     Pharynx: No oropharyngeal exudate or posterior oropharyngeal erythema.  Eyes:     General:        Right eye: No discharge.        Left eye: No discharge.  Cardiovascular:     Rate and Rhythm: Regular rhythm.  Pulmonary:     Effort: No respiratory distress.  Musculoskeletal:     Cervical back: No rigidity.  Neurological:     Mental Status: He is oriented to person, place, and time.  Psychiatric:        Behavior: Behavior normal.        Thought Content: Thought content normal.   Review of Systems  Constitutional: Negative.  Negative for chills, fever and malaise/fatigue.  HENT: Negative.  Negative for congestion and sore throat.   Eyes: Negative.  Negative for pain.  Respiratory: Negative.  Negative for cough and shortness of breath.   Cardiovascular: Negative.  Negative for chest pain and palpitations.  Gastrointestinal: Negative.  Negative for constipation,  diarrhea, heartburn, nausea and vomiting.  Genitourinary: Negative.   Musculoskeletal:  Positive for joint pain (complains of right shoulder pain). Negative for back pain, myalgias and neck pain.  Skin: Negative.  Negative for itching and rash.  Neurological:  Negative for dizziness, tingling, tremors and headaches.  Psychiatric/Behavioral:  Positive for depression (improving with medications) and substance abuse (history of cocaine abuse). Negative for hallucinations and suicidal ideas. The patient has insomnia (improving with medications). The patient is not nervous/anxious.   Blood pressure (!) 155/92, pulse 68, temperature 97.8 F (36.6 C), resp. rate 18, height 5\' 8"  (1.727 m), weight 84.4 kg, SpO2 98 %. Body mass index is 28.28 kg/m.  Treatment Plan Summary: Daily contact with  patient to assess and evaluate symptoms and progress in treatment and Medication management   Observation Level/Precautions:  15 minute checks  Laboratory:  Labs reviewed  Psychotherapy:  Unit Group sessions  Medications:  See [facility]  Consultations:  To be determined   Discharge Concerns:  Safety, medication compliance, mood stability  Estimated LOS: 5-7 days  Other:  N/A    Physician Treatment Plan for Primary Diagnosis: MDD (major depressive disorder)   PLAN Safety and Monitoring: Voluntary admission to inpatient psychiatric unit for safety, stabilization and treatment Daily contact with patient to assess and evaluate symptoms and progress in treatment Patient's case to be discussed in multi-disciplinary team meeting Observation Level : q15 minute checks Vital signs: q12 hours Precautions: suicide   Medications Principal Problem: MDD (major depressive disorder) -Continue Wellbutrin XL 150 mg daily   Anxiety -Continue Sertraline 50 mg daily  -Continue Atarax 25 mg TID PRN   Insomnia Continue Trazodone 50 mg nightly PRN   Medications for other medical reasons -Continue Atorvastatin 80 mg daily for hypercholesteremia -Increase Hydrochlorothiazide 37.5 mg daily for hypertension effective 1/5 with a one time dose of 12.5 mg given today. -Continue Lisinopril 20 mg daily for hypertension -Continue Hydrochlorothiazide 25 mg daily for hypertension     PRNS -Continue Tylenol 650 mg every 6 hours PRN for mild pain -Continue Maalox 30 mg every 4 hrs PRN for indigestion -Continue Milk of Magnesia as needed every 6 hrs for constipation   Discharge Planning: Social work and case management to assist with discharge planning and identification of hospital follow-up needs prior to discharge Estimated LOS: 5-7 days Discharge Concerns: Need to establish a safety plan; Medication compliance and effectiveness Discharge Goals: Return home with outpatient referrals for mental health follow-up  including medication management/psychotherapy  Nicholes Rough, NP 03/15/2021, 1:18 PM Patient ID: Troy Gordon, male   DOB: 08-Jun-1965, 56 y.o.   MRN: [id number]

## 2021-03-15 NOTE — BHH Counselor (Signed)
CSW spoke to Stas with [facility] who states pt has a high deductible plan and would have to pay $3000 to be admitted. CSW notified pt. Pt stated he cannot afford this at this time and plans to call [facility].  Fredirick Lathe, LCSWA Clinicial Social Worker [employer]

## 2021-03-15 NOTE — Progress Notes (Signed)
°   03/15/21 2114  Psych Admission Type (Psych Patients Only)  Admission Status Voluntary  Psychosocial Assessment  Patient Complaints Depression;Sadness  Eye Contact Fair  Facial Expression Pensive  Affect Appropriate to circumstance  Speech Logical/coherent  Interaction Assertive  Motor Activity Other (Comment) (WDL)  Appearance/Hygiene Unremarkable  Behavior Characteristics Cooperative;Appropriate to situation  Mood Depressed;Sad  Thought Process  Coherency WDL  Content WDL  Delusions None reported or observed  Perception WDL  Hallucination None reported or observed  Judgment WDL  Confusion None  Danger to Self  Current suicidal ideation? Denies  Self-Injurious Behavior No self-injurious ideation or behavior indicators observed or expressed   Agreement Not to Harm Self Yes  Description of Agreement Verbal contract  Danger to Others  Danger to Others None reported or observed

## 2021-03-15 NOTE — Group Note (Signed)
LCSW Group Therapy Note  Group Date: 03/15/2021 Start Time: 1300 End Time: 1400   Type of Therapy and Topic:  Group Therapy - Healthy vs Unhealthy Coping Skills  Participation Level:  Active   Description of Group The focus of this group was to determine what unhealthy coping techniques typically are used by group members and what healthy coping techniques would be helpful in coping with various problems. Patients were guided in becoming aware of the differences between healthy and unhealthy coping techniques. Patients were asked to identify 2-3 healthy coping skills they would like to learn to use more effectively.  Therapeutic Goals Patients learned that coping is what human beings do all day long to deal with various situations in their lives Patients defined and discussed healthy vs unhealthy coping techniques Patients identified their preferred coping techniques and identified whether these were healthy or unhealthy Patients determined 2-3 healthy coping skills they would like to become more familiar with and use more often. Patients provided support and ideas to each other   Summary of Patient Progress:  During group, pt expressed that a coping skill he used prior to being in the hospital is drugs. Patient proved open to input from peers and feedback from [place]. Patient demonstrated insight into the subject matter, was respectful of peers, and participated throughout the entire session.   Therapeutic Modalities Cognitive Behavioral Therapy Motivational Interviewing  Troy Gordon 03/15/2021  1:43 PM

## 2021-03-15 NOTE — BHH Counselor (Signed)
CSW met with pt and discussed residential treatment options with him. Pt asked for a referral to [facility] in [place], [place]. [place] [facility] sent referral and will continue to f/u.  Toney Reil, [place] Worker [employer]

## 2021-03-15 NOTE — BHH Group Notes (Signed)
The focus of this group is to educate the patient on the purpose and policies of crisis stabilization and provide a format to answer questions about their admission.  The group details unit policies and expectations of patients while admitted.  Pt attended morning goals group and stated that his goal was to exercise more.

## 2021-03-15 NOTE — Progress Notes (Signed)
Inpatient Diabetes Program Recommendations  AACE/ADA: New Consensus Statement on Inpatient Glycemic Control (2015)  Target Ranges:  Prepandial:   less than 140 mg/dL      Peak postprandial:   less than 180 mg/dL (1-2 hours)      Critically ill patients:  140 - 180 mg/dL   Lab Results  Component Value Date   GLUCAP 206 (H) 03/15/2021   HGBA1C 10.7 (H) 03/13/2021    Review of Glycemic Control  Diabetes history: DM2 Outpatient Diabetes medications: Lantus 30 QD, Tradjenta 5 mg QD (not taking) Current orders for Inpatient glycemic control: Semglee 35 units QD, Novolog 6 units TID with meals  Inpatient Diabetes Program Recommendations:    Increase Novolog to 10 units TID with meals  Continue to follow.  Thank you. Ailene Ards, RD, LDN, CDE Inpatient Diabetes Coordinator [phone]

## 2021-03-15 NOTE — Group Note (Signed)
Recreation Therapy Group Note   Group Topic:Stress Management  Group Date: 03/15/2021 Start Time: 0930 End Time: 0948 Facilitators: Caroll Rancher, LRT,CTRS Location: [address] Dayroom   Goal Area(s) Addresses:  Patient will actively participate in stress management techniques presented during session.  Patient will successfully identify benefit of practicing stress management post d/c.   Group Description: Guided Imagery. LRT provided education, instruction, and demonstration on practice of visualization via guided imagery. Patient was asked to participate in the technique introduced during session. LRT debriefed including topics of mindfulness, stress management and specific scenarios each patient could use these techniques. Patients were given suggestions of ways to access scripts post d/c and encouraged to explore Youtube and other apps available on smartphones, tablets, and computers.   Affect/Mood: Appropriate   Participation Level: Engaged   Participation Quality: Independent   Behavior: Appropriate   Speech/Thought Process: Focused   Insight: Good   Judgement: Good   Modes of Intervention: Script, Ambient Sounds   Patient Response to Interventions:  Engaged   Education Outcome:  Acknowledges education and In group clarification offered    Clinical Observations/Individualized Feedback: Pt attended and participated in group.    Plan: Continue to engage patient in RT group sessions 2-3x/week.   Caroll Rancher, LRT,CTRS  03/15/2021 12:10 PM

## 2021-03-16 LAB — GLUCOSE, CAPILLARY
Glucose-Capillary: 232 mg/dL — ABNORMAL HIGH (ref 70–99)
Glucose-Capillary: 181 mg/dL — ABNORMAL HIGH (ref 70–99)
Glucose-Capillary: 163 mg/dL — ABNORMAL HIGH (ref 70–99)
Glucose-Capillary: 199 mg/dL — ABNORMAL HIGH (ref 70–99)

## 2021-03-16 MED ORDER — TRAZODONE HCL 50 MG PO TABS: 50.0000 mg | ORAL_TABLET | Freq: Every evening | ORAL | Status: DC | PRN

## 2021-03-16 MED ORDER — HYDROCHLOROTHIAZIDE 50 MG PO TABS
50.0000 mg | ORAL_TABLET | Freq: Every day | ORAL | Status: DC
  Administered 2021-03-17 – 2021-03-18 (×2): 50 mg via ORAL
  Filled 2021-03-16 (×3): qty 1

## 2021-03-16 MED ORDER — TRAZODONE HCL 50 MG PO TABS
50.0000 mg | ORAL_TABLET | Freq: Every evening | ORAL | Status: DC | PRN
  Administered 2021-03-16 – 2021-03-17 (×2): 50 mg via ORAL
  Filled 2021-03-16 (×6): qty 1

## 2021-03-16 MED ORDER — HYDROXYZINE HCL 25 MG PO TABS: 25.0000 mg | ORAL_TABLET | Freq: Three times a day (TID) | ORAL | Status: DC | PRN

## 2021-03-16 MED ORDER — LISINOPRIL 20 MG PO TABS
20.0000 mg | ORAL_TABLET | Freq: Every day | ORAL | Status: DC
  Administered 2021-03-17 – 2021-03-18 (×2): 20 mg via ORAL
  Filled 2021-03-16: qty 7
  Filled 2021-03-16 (×4): qty 1

## 2021-03-16 MED ORDER — INSULIN GLARGINE-YFGN 100 UNIT/ML ~~LOC~~ SOLN
40.0000 [IU] | Freq: Every day | SUBCUTANEOUS | Status: DC
  Administered 2021-03-17 – 2021-03-18 (×2): 40 [IU] via SUBCUTANEOUS
  Filled 2021-03-16 (×11): qty 0.4

## 2021-03-16 NOTE — BHH Counselor (Signed)
Events affecting the discharge plan: ° -Disposition °Interventions by CSW: ° -CSW met with pt to check on his progress with [facility] ° -CSW discussed [facility] °Emotional response of the patient/family to the plan of care: °-Pt states he called [facility] and was asked to call back at a later time to complete intake. Pt states he called back later and left a message. Pt states that he is interested in [facility] as a backup plan. ° ° ° , LCSWA °Clinicial Social Worker °[facility]   °

## 2021-03-16 NOTE — Progress Notes (Addendum)
[facility] MD Progress Note  03/16/2021 10:02 AM Troy Gordon  MRN:  [id number]  Principal Problem: MDD (major depressive disorder) Diagnosis: Principal Problem:   MDD (major depressive disorder) Active Problems:   Cocaine use disorder, severe, dependence (HCC)   Suicidal ideation   Moderate cannabis use disorder (HCC)   GAD (generalized anxiety disorder)  History of Present Illness: Troy Gordon is a 56 year old male with a past history of MDD, cocaine & cannabis abuse, hypertension and Diabetes, who walked into [facility] ED with complaints of worsening suicidal ideations x 1 week, with a plan to jump off a bridge or into traffic. Pt was transferred to this [facility]) for treatment and stabilization of his mood.  BH Day 4   Yesterday's recommendation by the Psychiatry team Continue Wellbutrin XL 150 mg daily Continue sertraline 50 mg daily Increased HCTZ 25mg  to 37.5 mg daily for hypertension  Overnight events: No acute events overnight. Patient has been compliant with scheduled meds, no agitation PRN's required, and attended group appropriately. Patient slept 5.75.  Today's Evaluation (03/15/2021) Patient was seen initially asleep in bed, but was awoken easily and agreed to be interviewed in the interview room on 300 hall. He was pleasant and cooperative during encounter.  Patient still endorses poor sleep, c/o middle insomnia.  Pt agreeable with additional trazodone PRN for initial insomnia nd vistaril PRN for middle insomnia. He reports that appetite has improved. Stated that he called [facility] yesterday for discharge planning, however no one replied.  Stated that he will try again today.   He denies any adverse side effects to medications.  He denies body aches and pains, except for right shoulder pain, but does not appear to bother him much.  Patient denied SI/HI/AVH, delusions, paranoia, first rank symptoms, and contracted to safety on the unit.  Patient was not grossly responding to internal/external stimuli nor made any delusional statements during encounter.   Past Psychiatric History: Please see below  Past Medical History:  Past Medical History:  Diagnosis Date   Depression    Diabetes mellitus (HCC)    Dyslipidemia    Hyperlipidemia    Hypertension     Past Surgical History:  Procedure Laterality Date   CIRCUMCISION     as an adult   Family History:  Family History  Problem Relation Age of Onset   Diabetes Maternal Grandmother    Hypertension Maternal Grandmother    Hypertension Father    Family Psychiatric  History: See above Social History:  Social History   Substance and Sexual Activity  Alcohol Use Not Currently   Comment: occasionally beer     Social History   Substance and Sexual Activity  Drug Use Yes   Types: Marijuana, Cocaine   Comment: last used crack 06/07/20    Social History   Socioeconomic History   Marital status: Single    Spouse name: Not on file   Number of children: Not on file   Years of education: Not on file   Highest education level: Not on file  Occupational History   Not on file  Tobacco Use   Smoking status: Former    Years: 1.00    Types: Cigarettes   Smokeless tobacco: Never   Tobacco comments:    1 cig every 5 weeks  Vaping Use   Vaping Use: Never used  Substance and Sexual Activity   Alcohol use: Not Currently    Comment: occasionally beer   Drug use: Yes  Types: Marijuana, Cocaine    Comment: last used crack 06/07/20   Sexual activity: Not on file  Other Topics Concern   Not on file  Social History Narrative   Not on file   Social Determinants of Health   Financial Resource Strain: Not on file  Food Insecurity: Not on file  Transportation Needs: Not on file  Physical Activity: Not on file  Stress: Not on file  Social Connections: Not on file   Sleep: Poor  Appetite:  Good  Current Medications: Current Facility-Administered Medications   Medication Dose Route Frequency Provider Last Rate Last Admin   acetaminophen (TYLENOL) tablet 650 mg  650 mg Oral Q6H PRN Rankin, Shuvon B, NP   650 mg at 03/13/21 1417   alum & mag hydroxide-simeth (MAALOX/MYLANTA) 200-200-20 MG/5ML suspension 30 mL  30 mL Oral Q4H PRN Rankin, Shuvon B, NP       atorvastatin (LIPITOR) tablet 80 mg  80 mg Oral Daily Rankin, Shuvon B, NP   80 mg at 03/16/21 0750   buPROPion (WELLBUTRIN XL) 24 hr tablet 150 mg  150 mg Oral Daily Rankin, Shuvon B, NP   150 mg at 03/16/21 0752   lisinopril (ZESTRIL) tablet 20 mg  20 mg Oral Daily Nkwenti, [name], NP   20 mg at 03/16/21 9604   And   hydrochlorothiazide (HYDRODIURIL) tablet 37.5 mg  37.5 mg Oral Daily Nkwenti, [name], NP   37.5 mg at 03/16/21 0751   hydrOXYzine (ATARAX) tablet 25 mg  25 mg Oral TID PRN Bobbitt, Shalon E, NP   25 mg at 03/15/21 2114   ibuprofen (ADVIL) tablet 400 mg  400 mg Oral Q6H PRN Starleen Blue, NP   400 mg at 03/16/21 0801   insulin aspart (novoLOG) injection 0-5 Units  0-5 Units Subcutaneous QHS Enza Shone, Harrold Donath, MD   2 Units at 03/15/21 2115   insulin aspart (novoLOG) injection 0-9 Units  0-9 Units Subcutaneous TID WC Rayhaan Huster, Harrold Donath, MD   3 Units at 03/16/21 0629   insulin aspart (novoLOG) injection 10 Units  10 Units Subcutaneous TID WC Geoffry Bannister, Harrold Donath, MD   10 Units at 03/16/21 0628   insulin glargine-yfgn (SEMGLEE) injection 35 Units  35 Units Subcutaneous Daily Pamalee Marcoe, Harrold Donath, MD   35 Units at 03/16/21 0758   magnesium hydroxide (MILK OF MAGNESIA) suspension 30 mL  30 mL Oral Daily PRN Rankin, Shuvon B, NP       sertraline (ZOLOFT) tablet 50 mg  50 mg Oral Daily Nkwenti, [name], NP   50 mg at 03/16/21 5409   traZODone (DESYREL) tablet 50 mg  50 mg Oral QHS PRN Rankin, Shuvon B, NP   50 mg at 03/15/21 2114    Lab Results:  Results for orders placed or performed during the hospital encounter of 03/12/21 (from the past 48 hour(s))  Glucose, capillary     Status: Abnormal    Collection Time: 03/14/21 11:59 AM  Result Value Ref Range   Glucose-Capillary 248 (H) 70 - 99 mg/dL    Comment: Glucose reference range applies only to samples taken after fasting for at least 8 hours.   Comment 1 Notify RN   Glucose, capillary     Status: Abnormal   Collection Time: 03/14/21  4:46 PM  Result Value Ref Range   Glucose-Capillary 311 (H) 70 - 99 mg/dL    Comment: Glucose reference range applies only to samples taken after fasting for at least 8 hours.   Comment 1 Notify RN   Glucose,  capillary     Status: Abnormal   Collection Time: 03/14/21  8:42 PM  Result Value Ref Range   Glucose-Capillary 291 (H) 70 - 99 mg/dL    Comment: Glucose reference range applies only to samples taken after fasting for at least 8 hours.   Comment 1 Notify RN    Comment 2 Document in Chart   Glucose, capillary     Status: Abnormal   Collection Time: 03/15/21  5:59 AM  Result Value Ref Range   Glucose-Capillary 206 (H) 70 - 99 mg/dL    Comment: Glucose reference range applies only to samples taken after fasting for at least 8 hours.   Comment 1 Notify RN    Comment 2 Document in Chart   Glucose, capillary     Status: Abnormal   Collection Time: 03/15/21 12:00 PM  Result Value Ref Range   Glucose-Capillary 154 (H) 70 - 99 mg/dL    Comment: Glucose reference range applies only to samples taken after fasting for at least 8 hours.   Comment 1 Notify RN   Glucose, capillary     Status: Abnormal   Collection Time: 03/15/21  5:05 PM  Result Value Ref Range   Glucose-Capillary 208 (H) 70 - 99 mg/dL    Comment: Glucose reference range applies only to samples taken after fasting for at least 8 hours.   Comment 1 Notify RN   Glucose, capillary     Status: Abnormal   Collection Time: 03/15/21  9:11 PM  Result Value Ref Range   Glucose-Capillary 244 (H) 70 - 99 mg/dL    Comment: Glucose reference range applies only to samples taken after fasting for at least 8 hours.  Glucose, capillary      Status: Abnormal   Collection Time: 03/16/21  5:37 AM  Result Value Ref Range   Glucose-Capillary 232 (H) 70 - 99 mg/dL    Comment: Glucose reference range applies only to samples taken after fasting for at least 8 hours.    Blood Alcohol level:  Lab Results  Component Value Date   ETH <10 03/11/2021   ETH <10 10/24/2020    Metabolic Disorder Labs: Lab Results  Component Value Date   HGBA1C 10.7 (H) 03/13/2021   MPG 260.39 03/13/2021   MPG 277.61 10/24/2020   No results found for: PROLACTIN Lab Results  Component Value Date   CHOL 274 (H) 03/13/2021   TRIG 390 (H) 03/13/2021   HDL 45 03/13/2021   CHOLHDL 6.1 03/13/2021   VLDL 78 (H) 03/13/2021   LDLCALC 151 (H) 03/13/2021   LDLCALC 222 (H) 10/26/2020   Physical Findings: AIMS: N/A CIWA:  N/A COWS:  N/A  Musculoskeletal: Strength & Muscle Tone: within normal limits Gait & Station: normal Patient leans: N/A  Psychiatric Specialty Exam:  Presentation  General Appearance: Appropriate for Environment; Fairly Groomed  Eye Contact:Fair  Speech:Clear and Coherent  Speech Volume:Normal  Handedness:Right  Mood and Affect  Mood:Euthymic  Affect:Appropriate  Thought Process  Thought Processes:Coherent  Descriptions of Associations:Intact  Orientation:Full (Time, Place and Person)  Thought Content:Logical  History of Schizophrenia/Schizoaffective disorder:No  Duration of Psychotic Symptoms:N/A  Hallucinations:Hallucinations: None  Ideas of Reference:None  Suicidal Thoughts:Suicidal Thoughts: No  Homicidal Thoughts:Homicidal Thoughts: No  Sensorium  Memory:Immediate Good  Judgment:Good  Insight:Good  Executive Functions  Concentration:Good  Attention Span:Good  Recall:Good  Fund of Knowledge:Good  Language:Good  Psychomotor Activity  Psychomotor Activity:Psychomotor Activity: Normal  Assets  Assets:Communication Skills; Desire for Improvement  Sleep  Sleep:Sleep:  Good  Physical Exam: Physical Exam Vitals and nursing note reviewed.  Constitutional:      General: He is awake. He is not in acute distress.    Appearance: He is not ill-appearing or diaphoretic.  HENT:     Head: Normocephalic.  Pulmonary:     Effort: Pulmonary effort is normal.  Neurological:     General: No focal deficit present.     Mental Status: He is alert.  Psychiatric:        Behavior: Behavior is cooperative.   Review of Systems  Constitutional: Negative.  Negative for chills, fever and malaise/fatigue.  HENT: Negative.  Negative for congestion and sore throat.   Eyes: Negative.  Negative for pain.  Respiratory: Negative.  Negative for cough and shortness of breath.   Cardiovascular: Negative.  Negative for chest pain and palpitations.  Gastrointestinal: Negative.  Negative for constipation, diarrhea, heartburn, nausea and vomiting.  Genitourinary: Negative.   Musculoskeletal:  Positive for joint pain (complains of right shoulder pain). Negative for back pain, myalgias and neck pain.  Skin: Negative.  Negative for itching and rash.  Neurological:  Negative for dizziness, tingling, tremors and headaches.   Blood pressure (!) 151/92, pulse 71, temperature 97.6 F (36.4 C), resp. rate 18, height 5\' 8"  (1.727 m), weight 84.4 kg, SpO2 97 %. Body mass index is 28.28 kg/m.  Treatment Plan Summary: Daily contact with patient to assess and evaluate symptoms and progress in treatment and Medication management   Observation Level/Precautions:  15 minute checks  Laboratory:  Labs reviewed  Psychotherapy:  Unit Group sessions  Medications:  See [facility]  Consultations:  To be determined   Discharge Concerns:  Safety, medication compliance, mood stability  Estimated LOS: 5-7 days  Other:  N/A    Physician Treatment Plan for Primary Diagnosis: MDD (major depressive disorder)   PLAN Safety and Monitoring: Voluntary admission to inpatient psychiatric unit for safety,  stabilization and treatment Daily contact with patient to assess and evaluate symptoms and progress in treatment Patient's case to be discussed in multi-disciplinary team meeting Observation Level : q15 minute checks Vital signs: q12 hours Precautions: suicide   Medications Principal Problem: MDD (major depressive disorder) and GAD -Continue Wellbutrin XL 150 mg daily -Continue Sertraline 50 mg daily  -Continue Atarax 25 mg TID PRN -Change Trazodone to 50 mg nightly scheduled plus 50 mg PRN, if patient does not fall asleep within 30 minutes -Continue Atarax 25 mg PRN for waking up in the middle of the night   Medications for other medical reasons -Continue Atorvastatin 80 mg daily for hypercholesteremia -INCREASE hydrochlorothiazide from 37.5 mg to 50 mg once daily for hypertension  -Continue Lisinopril 20 mg daily for hypertension -INCREASE basal insulin from 36 units to 40 units (home dose) for diabetes    PRNS -Continue Tylenol 650 mg every 6 hours PRN for mild pain -Continue Maalox 30 mg every 4 hrs PRN for indigestion -Continue Milk of Magnesia as needed every 6 hrs for constipation   Discharge Planning: Social work and case management to assist with discharge planning and identification of hospital follow-up needs prior to discharge Estimated LOS: 5-7 days Discharge Concerns: Need to establish a safety plan; Medication compliance and effectiveness Discharge Goals: Return home with outpatient referrals for mental health follow-up including medication management/psychotherapy  Signed: [facility], DO Psychiatry Resident, PGY-1 [facility] 03/16/2021, 1:15 PM  Patient ID: Trevel Dillenbeck, male   DOB: 07-13-1965, 56 y.o.   MRN: [id number]   Total Time  Spent in Direct Patient Care:  I personally spent 30 minutes on the unit in direct patient care. The direct patient care time included face-to-face time with the patient, reviewing the patient's chart, communicating with other  professionals, and coordinating care. Greater than 50% of this time was spent in counseling or coordinating care with the patient regarding goals of hospitalization, psycho-education, and discharge planning needs.  I have independently evaluated the patient during a face-to-face assessment on 03/16/21. I reviewed the patient's chart, and I participated in key portions of the service. I discussed the case with the [employer], and I agree with the assessment and plan of care as documented in the House Officer's note, as addended by me or notated below:  I directly edited the note, as above.   Phineas InchesNathan Marci Polito, MD Psychiatrist

## 2021-03-16 NOTE — Progress Notes (Signed)
Inpatient Diabetes Program Recommendations  AACE/ADA: New Consensus Statement on Inpatient Glycemic Control (2015)  Target Ranges:  Prepandial:   less than 140 mg/dL      Peak postprandial:   less than 180 mg/dL (1-2 hours)      Critically ill patients:  140 - 180 mg/dL   Lab Results  Component Value Date   GLUCAP 232 (H) 03/16/2021   HGBA1C 10.7 (H) 03/13/2021    Review of Glycemic Control  Latest Reference Range & Units 03/15/21 05:59 03/15/21 12:00 03/15/21 17:05 03/15/21 21:11 03/16/21 05:37  Glucose-Capillary 70 - 99 mg/dL 206 (H) 154 (H) 208 (H) 244 (H) 232 (H)   Diabetes history: DM2 Outpatient Diabetes medications: Lantus 30 QD, Tradjenta 5 mg QD (not taking) Current orders for Inpatient glycemic control: Semglee 35 units QD Novolog 0-9 units tid Novolog 10 units TID with meals  Inpatient Diabetes Program Recommendations:    Increase Semglee to 40 units  Continue to follow.  Thanks,  Tama Headings RN, MSN, BC-ADM Inpatient Diabetes Coordinator Team Pager [phone] (8a-5p)

## 2021-03-16 NOTE — Hospital Course (Addendum)
[name] is a 56 y.o. male with a past history of MDD, cocaine & cannabis abuse, hypertension and Diabetes, who walked into [facility] ED with complaints of worsening suicidal ideations x 1 week, with a plan to jump off a bridge or into traffic. Pt was transferred to this [facility]) for treatment and stabilization of his mood. [facility] LOS: 6 days   Plan: MDD (major depressive disorder) and GAD -Continued Wellbutrin XL 150 mg daily -Continued Sertraline 50 mg daily  -Continued Atarax 25 mg TID PRN -Continued Trazodone to 50 mg nightly scheduled + 50 mg PRN, if patient does not fall asleep within 30 minutes -Continued Atarax 25 mg PRN for waking up in the middle of the night   Medications for other medical reasons -Continued Atorvastatin 80 mg daily for hypercholesteremia -STARTED clonidine 0.1 mg daily for hypertension -Continue hydrochlorothiazide 50 mg once daily for hypertension  -Continued Lisinopril 20 mg daily for hypertension -Continued basal insulin 40 units (home dose) for diabetes

## 2021-03-16 NOTE — Progress Notes (Signed)
D:  Patient's self inventory sheet, patient has poor sleep, sleep medication not helpful.  Fair appetite, low energy level, good concentration.  Rated depression and anxiety 2, hopeless 1.  Denied withdrawals.  Denied SI.  Denied physical pain.  Goal is talk to [facility] in [place].  No discharge plans.  A:  Medications administered per MD orders.  Emotional support and encouragement given patient. R:  Patient denied SI and HI, contracts for safety.   Denied A/V hallucinations.  Safety maintained with 15 minute checks.

## 2021-03-16 NOTE — Plan of Care (Signed)
Nurse discussed anxiety, depression and coping skills with patient.  

## 2021-03-16 NOTE — Progress Notes (Signed)
[facility] Group Notes:  (Nursing/MHT/Case Management/Adjunct)  Date:  03/16/2021  Time:  2015 Type of Therapy:   wrap up group  Participation Level:  Active  Participation Quality:  Appropriate, Attentive, Sharing, and Supportive  Affect:  Flat  Cognitive:  Alert  Insight:  Improving  Engagement in Group:  Engaged  Modes of Intervention:  Clarification, Education, and Support  Summary of Progress/Problems: Positive thinking and positive change were discussed.   Marcille Buffy 03/16/2021, 8:51 PM

## 2021-03-17 ENCOUNTER — Encounter (HOSPITAL_COMMUNITY): Payer: Self-pay

## 2021-03-17 DIAGNOSIS — F329 Major depressive disorder, single episode, unspecified: Secondary | ICD-10-CM

## 2021-03-17 LAB — GLUCOSE, CAPILLARY
Glucose-Capillary: 114 mg/dL — ABNORMAL HIGH (ref 70–99)
Glucose-Capillary: 151 mg/dL — ABNORMAL HIGH (ref 70–99)
Glucose-Capillary: 118 mg/dL — ABNORMAL HIGH (ref 70–99)
Glucose-Capillary: 167 mg/dL — ABNORMAL HIGH (ref 70–99)

## 2021-03-17 LAB — VITAMIN B1: Vitamin B1 (Thiamine): 105 nmol/L (ref 66.5–200.0)

## 2021-03-17 MED ORDER — CLONIDINE HCL 0.1 MG PO TABS
0.1000 mg | ORAL_TABLET | Freq: Every day | ORAL | Status: DC
  Administered 2021-03-17: 0.1 mg via ORAL
  Filled 2021-03-17 (×3): qty 1

## 2021-03-17 MED ORDER — CLONIDINE HCL 0.1 MG PO TABS
0.1000 mg | ORAL_TABLET | Freq: Two times a day (BID) | ORAL | Status: DC
  Administered 2021-03-17 – 2021-03-18 (×2): 0.1 mg via ORAL
  Filled 2021-03-17 (×2): qty 14
  Filled 2021-03-17 (×2): qty 1

## 2021-03-17 NOTE — Group Note (Signed)
Recreation Therapy Group Note   Group Topic:Stress Management  Group Date: 03/17/2021 Start Time: 0940 End Time: 0955 Facilitators: Caroll Rancher, [place] Location: [address] Dayroom   Goal Area(s) Addresses:  Patient will identify positive stress management techniques. Patient will identify benefits of using stress management post d/c.  Group Description: LRT played a meditation that focused on taking on the characteristics of a mountain. The meditation spoke of how mountains go through changes in weather, seasons, etc.  No mater what they face, they are able to withstand each challenge it faces.  Through the meditation, patients were encouraged to be like the mountain and learn to withstand any changes or obstacles they may face in life.   Affect/Mood: Appropriate   Participation Level: Active   Participation Quality: Independent   Behavior: Appropriate   Speech/Thought Process: Focused   Insight: Good   Judgement: Good   Modes of Intervention: Meditation   Patient Response to Interventions:  Engaged   Education Outcome:  Acknowledges education and In group clarification offered    Clinical Observations/Individualized Feedback: Pt attended and participated in group.    Plan: Continue to engage patient in RT group sessions 2-3x/week.   Caroll Rancher, LRT,CTRS 03/17/2021 11:49 AM

## 2021-03-17 NOTE — Progress Notes (Addendum)
[facility]  03/17/2021 2:44 PM Troy Gordon  MRN:  [id number]  Principal Problem: MDD (major depressive disorder) Diagnosis: Principal Problem:   MDD (major depressive disorder) Active Problems:   Cocaine use disorder, severe, dependence (HCC)   Suicidal ideation   Moderate cannabis use disorder (HCC)   GAD (generalized anxiety disorder)  History of Present Illness: Troy Gordon is a 56 year old male with a past history of MDD, cocaine & cannabis abuse, hypertension and Diabetes, who [facility] ED with complaints of worsening suicidal ideations x 1 week, with a plan to jump off a bridge or into traffic. [facility] for treatment and stabilization of his mood.  BH Day 5   Yesterday's recommendation by the Psychiatry team Change Trazodone to 50 mg nightly scheduled plus 50 mg PRN, if patient does not fall asleep within 30 minutes Continue Atarax 25 mg PRN for waking up in the middle of the night Continue Wellbutrin XL 150 mg daily Continue Sertraline 50 mg daily INCREASE basal insulin from 36 units to 40 units (home dose) for diabetes INCREASE hydrochlorothiazide from 37.5 mg to 50 mg once daily for hypertension Continue Lisinopril 20 mg daily for hypertension  Overnight events: No acute events overnight. Patient has been compliant with scheduled meds, no agitation PRN's required, and attended group appropriately. Patient slept 5.75.  Today's Evaluation (03/17/2021) Patient was initially seen attending group therapy and was pleasant and cooperative during evaluation. Patient reports that mood is better and denies feeling down depressed or sad.  He reports that sleep is better. He continues to c/o middle insomnia but he did not request vistaril PRN for this last night.  Discussed with patient that Vistaril would help him fall asleep, and return to sleep in the middle of the night. He reports that his appetite  is stable.  He denies any adverse side effects to medications.  Discussed with patient that his blood pressure is still elevated despite being on 2 agents, that an additional agent will be added until the other blood pressure medication as time to maximize its effects, to which patient was amenable to changes.  He denies headache, dizziness, or feeling like he is on fall.   Patient denied SI/HI/AVH, delusions, paranoia, first rank symptoms, and contracted to safety on the unit. Patient was not grossly responding to internal/external stimuli nor made any delusional statements during encounter.  Otherwise patient had no other concerns or questions.  Past Psychiatric History: Please see below  Past Medical History:  Past Medical History:  Diagnosis Date   Depression    Diabetes mellitus (HCC)    Dyslipidemia    Hyperlipidemia    Hypertension     Past Surgical History:  Procedure Laterality Date   CIRCUMCISION     as an adult   Family History:  Family History  Problem Relation Age of Onset   Diabetes Maternal Grandmother    Hypertension Maternal Grandmother    Hypertension Father    Family Psychiatric  History: See above Social History:  Social History   Substance and Sexual Activity  Alcohol Use Not Currently   Comment: occasionally beer     Social History   Substance and Sexual Activity  Drug Use Yes   Types: Marijuana, Cocaine   Comment: last used crack 06/07/20    Social History   Socioeconomic History   Marital status: Single    Spouse name: Not on file   Number  of children: Not on file   Years of education: Not on file   Highest education level: Not on file  Occupational History   Not on file  Tobacco Use   Smoking status: Former    Years: 1.00    Types: Cigarettes   Smokeless tobacco: Never   Tobacco comments:    1 cig every 5 weeks  Vaping Use   Vaping Use: Never used  Substance and Sexual Activity   Alcohol use: Not Currently    Comment:  occasionally beer   Drug use: Yes    Types: Marijuana, Cocaine    Comment: last used crack 06/07/20   Sexual activity: Not on file  Other Topics Concern   Not on file  Social History Narrative   Not on file   Social Determinants of Health   Financial Resource Strain: Not on file  Food Insecurity: Not on file  Transportation Needs: Not on file  Physical Activity: Not on file  Stress: Not on file  Social Connections: Not on file   Sleep: improving  Appetite:  Good  Current Medications: Current Facility-Administered Medications  Medication Dose Route Frequency Provider Last Rate Last Admin   acetaminophen (TYLENOL) tablet 650 mg  650 mg Oral Q6H PRN Rankin, Shuvon B, NP   650 mg at 03/17/21 0803   alum & mag hydroxide-simeth (MAALOX/MYLANTA) 200-200-20 MG/5ML suspension 30 mL  30 mL Oral Q4H PRN Rankin, Shuvon B, NP       atorvastatin (LIPITOR) tablet 80 mg  80 mg Oral Daily Rankin, Shuvon B, NP   80 mg at 03/17/21 0804   buPROPion (WELLBUTRIN XL) 24 hr tablet 150 mg  150 mg Oral Daily Rankin, Shuvon B, NP   150 mg at 03/17/21 6812   cloNIDine (CATAPRES) tablet 0.1 mg  0.1 mg Oral Daily Chandel Zaun, MD   0.1 mg at 03/17/21 1201   lisinopril (ZESTRIL) tablet 20 mg  20 mg Oral Daily Treysean Petruzzi, MD   20 mg at 03/17/21 0803   And   hydrochlorothiazide (HYDRODIURIL) tablet 50 mg  50 mg Oral Daily Tamberly Pomplun, Harrold Donath, MD   50 mg at 03/17/21 0804   hydrOXYzine (ATARAX) tablet 25 mg  25 mg Oral TID PRN Princess Bruins, DO       ibuprofen (ADVIL) tablet 400 mg  400 mg Oral Q6H PRN Starleen Blue, NP   400 mg at 03/16/21 2114   insulin aspart (novoLOG) injection 0-5 Units  0-5 Units Subcutaneous QHS Valton Schwartz, Harrold Donath, MD   2 Units at 03/15/21 2115   insulin aspart (novoLOG) injection 0-9 Units  0-9 Units Subcutaneous TID WC Demorris Choyce, Harrold Donath, MD   2 Units at 03/17/21 1157   insulin aspart (novoLOG) injection 10 Units  10 Units Subcutaneous TID WC Aleen Marston, Harrold Donath, MD   10  Units at 03/17/21 1159   insulin glargine-yfgn (SEMGLEE) injection 40 Units  40 Units Subcutaneous Daily Princess Bruins, DO   40 Units at 03/17/21 0807   magnesium hydroxide (MILK OF MAGNESIA) suspension 30 mL  30 mL Oral Daily PRN Rankin, Shuvon B, NP       sertraline (ZOLOFT) tablet 50 mg  50 mg Oral Daily Nkwenti, [name], NP   50 mg at 03/17/21 0803   traZODone (DESYREL) tablet 50 mg  50 mg Oral QHS,MR X 1 Ketra Duchesne, MD   50 mg at 03/16/21 2114    Lab Results:  Results for orders placed or performed during the hospital encounter of 03/12/21 (from the past 48  hour(s))  Glucose, capillary     Status: Abnormal   Collection Time: 03/15/21  5:05 PM  Result Value Ref Range   Glucose-Capillary 208 (H) 70 - 99 mg/dL    Comment: Glucose reference range applies only to samples taken after fasting for at least 8 hours.   Comment 1 Notify RN   Glucose, capillary     Status: Abnormal   Collection Time: 03/15/21  9:11 PM  Result Value Ref Range   Glucose-Capillary 244 (H) 70 - 99 mg/dL    Comment: Glucose reference range applies only to samples taken after fasting for at least 8 hours.  Glucose, capillary     Status: Abnormal   Collection Time: 03/16/21  5:37 AM  Result Value Ref Range   Glucose-Capillary 232 (H) 70 - 99 mg/dL    Comment: Glucose reference range applies only to samples taken after fasting for at least 8 hours.  Glucose, capillary     Status: Abnormal   Collection Time: 03/16/21 11:55 AM  Result Value Ref Range   Glucose-Capillary 181 (H) 70 - 99 mg/dL    Comment: Glucose reference range applies only to samples taken after fasting for at least 8 hours.  Glucose, capillary     Status: Abnormal   Collection Time: 03/16/21  5:28 PM  Result Value Ref Range   Glucose-Capillary 163 (H) 70 - 99 mg/dL    Comment: Glucose reference range applies only to samples taken after fasting for at least 8 hours.  Glucose, capillary     Status: Abnormal   Collection Time: 03/16/21  8:45  PM  Result Value Ref Range   Glucose-Capillary 199 (H) 70 - 99 mg/dL    Comment: Glucose reference range applies only to samples taken after fasting for at least 8 hours.   Comment 1 Notify RN    Comment 2 Document in Chart   Glucose, capillary     Status: Abnormal   Collection Time: 03/17/21  5:56 AM  Result Value Ref Range   Glucose-Capillary 114 (H) 70 - 99 mg/dL    Comment: Glucose reference range applies only to samples taken after fasting for at least 8 hours.   Comment 1 Notify RN    Comment 2 Document in Chart   Glucose, capillary     Status: Abnormal   Collection Time: 03/17/21 11:53 AM  Result Value Ref Range   Glucose-Capillary 151 (H) 70 - 99 mg/dL    Comment: Glucose reference range applies only to samples taken after fasting for at least 8 hours.    Blood Alcohol level:  Lab Results  Component Value Date   ETH <10 03/11/2021   ETH <10 10/24/2020    Metabolic Disorder Labs: Lab Results  Component Value Date   HGBA1C 10.7 (H) 03/13/2021   MPG 260.39 03/13/2021   MPG 277.61 10/24/2020   No results found for: PROLACTIN Lab Results  Component Value Date   CHOL 274 (H) 03/13/2021   TRIG 390 (H) 03/13/2021   HDL 45 03/13/2021   CHOLHDL 6.1 03/13/2021   VLDL 78 (H) 03/13/2021   LDLCALC 151 (H) 03/13/2021   LDLCALC 222 (H) 10/26/2020   Physical Findings: AIMS: N/A CIWA:  N/A COWS:  N/A  Musculoskeletal: Strength & Muscle Tone: within normal limits Gait & Station: normal Patient leans: N/A  Psychiatric Specialty Exam:  Presentation  General Appearance: Appropriate for Environment; Casual; Fairly Groomed  Eye Contact:Good  Speech:Clear and Coherent; Normal Rate  Speech Volume:Normal  Handedness:Right  Mood  and Affect  Mood:Euthymic  Affect:Appropriate; Congruent; Full Range  Thought Process  Thought Processes:Linear  Descriptions of Associations:Intact  Orientation:Full (Time, Place and Person)  Thought Content:Logical  History of  Schizophrenia/Schizoaffective disorder:No  Duration of Psychotic Symptoms:N/A  Hallucinations:Hallucinations: None  Ideas of Reference:None  Suicidal Thoughts:Suicidal Thoughts: No  Homicidal Thoughts:Homicidal Thoughts: No  Sensorium  Memory:Immediate Good; Recent Good; Remote Good  Judgment:Good  Insight:Good  Executive Functions  Concentration:Good  Attention Span:Good  Recall:Good  Fund of Knowledge:Good  Language:Good  Psychomotor Activity  Psychomotor Activity:Psychomotor Activity: Normal  Assets  Assets:Communication Skills; Desire for Improvement; Resilience; Talents/Skills  Sleep  Sleep: Poor  Physical Exam: Physical Exam Vitals and nursing note reviewed.  Constitutional:      General: He is awake. He is not in acute distress.    Appearance: He is not ill-appearing or diaphoretic.  HENT:     Head: Normocephalic.  Pulmonary:     Effort: Pulmonary effort is normal.  Neurological:     General: No focal deficit present.     Mental Status: He is alert.  Psychiatric:        Behavior: Behavior is cooperative.   Review of Systems  Constitutional: Negative.  Negative for chills, fever and malaise/fatigue.  HENT: Negative.  Negative for congestion and sore throat.   Eyes: Negative.  Negative for pain.  Respiratory: Negative.  Negative for cough and shortness of breath.   Cardiovascular: Negative.  Negative for chest pain and palpitations.  Gastrointestinal: Negative.  Negative for constipation, diarrhea, heartburn, nausea and vomiting.  Genitourinary: Negative.   Musculoskeletal:  Positive for joint pain (complains of right shoulder pain). Negative for back pain, myalgias and neck pain.  Skin: Negative.  Negative for itching and rash.  Neurological:  Negative for dizziness, tingling, tremors and headaches.  Psychiatric/Behavioral:  Positive for depression and substance abuse. Negative for hallucinations and suicidal ideas. The patient has insomnia. The  patient is not nervous/anxious.    Blood pressure (!) 151/100, pulse 67, temperature (!) 97.5 F (36.4 C), temperature source Oral, resp. rate 20, height 5\' 8"  (1.727 m), weight 84.4 kg, SpO2 100 %. Body mass index is 28.28 kg/m.  Treatment Plan Summary: Daily contact with patient to assess and evaluate symptoms and progress in treatment and Medication management   Observation Level/Precautions:  15 minute checks  Laboratory:  Labs reviewed  Psychotherapy:  Unit Group sessions  Medications:  See [facility]  Consultations:  To be determined   Discharge Concerns:  Safety, medication compliance, mood stability  Estimated LOS: 03/18/2021  Other:  N/A    Physician Treatment Plan for Primary Diagnosis: MDD (major depressive disorder)   PLAN Safety and Monitoring: Voluntary admission to inpatient psychiatric unit for safety, stabilization and treatment Daily contact with patient to assess and evaluate symptoms and progress in treatment Patient's case to be discussed in multi-disciplinary team meeting Observation Level : q15 minute checks Vital signs: q12 hours Precautions: suicide   Medications Principal Problem: MDD (major depressive disorder) and GAD -Continue Wellbutrin XL 150 mg daily -Continue Sertraline 50 mg daily  -Continue Atarax 25 mg TID PRN -Continue Trazodone to 50 mg nightly scheduled + 50 mg PRN, if patient does not fall asleep within 30 minutes -Continue Atarax 25 mg PRN for waking up in the middle of the night   Medications for other medical reasons -Continue Atorvastatin 80 mg daily for hypercholesteremia -START clonidine 0.1 mg daily for hypertension -Continue hydrochlorothiazide 50 mg once daily for hypertension  -Continue Lisinopril 20 mg daily  for hypertension -Continue basal insulin 40 units (home dose) for diabetes    PRNS -Continue Tylenol 650 mg every 6 hours PRN for mild pain -Continue Maalox 30 mg every 4 hrs PRN for indigestion -Continue Milk of Magnesia  as needed every 6 hrs for constipation   Discharge Planning: Social work and case management to assist with discharge planning and identification of hospital follow-up needs prior to discharge Estimated LOS: 03/18/2021 at 2 PM to [facility] Discharge Concerns: Need to establish a safety plan; Medication compliance and effectiveness Discharge Goals: Return home with outpatient referrals for mental health follow-up including medication management/psychotherapy  Signed: Princess Bruins, DO Psychiatry Resident, PGY-1 [facility] 03/17/2021, 2:44 PM  Patient ID: Troy Gordon, male   DOB: [birth year], 56 y.o.   MRN: [id number]   Total Time Spent in Direct Patient Care:  I personally spent 30 minutes on the unit in direct patient care. The direct patient care time included face-to-face time with the patient, reviewing the patient's chart, communicating with other professionals, and coordinating care. Greater than 50% of this time was spent in counseling or coordinating care with the patient regarding goals of hospitalization, psycho-education, and discharge planning needs.  I have independently evaluated the patient during a face-to-face assessment on 03/17/21. I reviewed the patient's chart, and I participated in key portions of the service. I discussed the case with the [employer], and I agree with the assessment and plan of care as documented in the House Officer's note, as addended by me or notated below:  I directly edited the note, as above.   Phineas Inches, MD Psychiatrist

## 2021-03-17 NOTE — BH IP Treatment Plan (Signed)
Interdisciplinary Treatment and Diagnostic Plan Update  03/17/2021 Troy Gordon MRN: [id number]  Principal Diagnosis: MDD (major depressive disorder)  Secondary Diagnoses: Principal Problem:   MDD (major depressive disorder) Active Problems:   Cocaine use disorder, severe, dependence (HCC)   Suicidal ideation   Moderate cannabis use disorder (HCC)   GAD (generalized anxiety disorder)   Current Medications:  Current Facility-Administered Medications  Medication Dose Route Frequency Provider Last Rate Last Admin   acetaminophen (TYLENOL) tablet 650 mg  650 mg Oral Q6H PRN Rankin, Shuvon B, NP   650 mg at 03/17/21 0803   alum & mag hydroxide-simeth (MAALOX/MYLANTA) 200-200-20 MG/5ML suspension 30 mL  30 mL Oral Q4H PRN Rankin, Shuvon B, NP       atorvastatin (LIPITOR) tablet 80 mg  80 mg Oral Daily Rankin, Shuvon B, NP   80 mg at 03/17/21 0804   buPROPion (WELLBUTRIN XL) 24 hr tablet 150 mg  150 mg Oral Daily Rankin, Shuvon B, NP   150 mg at 03/17/21 0803   lisinopril (ZESTRIL) tablet 20 mg  20 mg Oral Daily Massengill, Nathan, MD   20 mg at 03/17/21 0803   And   hydrochlorothiazide (HYDRODIURIL) tablet 50 mg  50 mg Oral Daily Massengill, Harrold Donath, MD   50 mg at 03/17/21 0804   hydrOXYzine (ATARAX) tablet 25 mg  25 mg Oral TID PRN Princess Bruins, DO       ibuprofen (ADVIL) tablet 400 mg  400 mg Oral Q6H PRN Starleen Blue, NP   400 mg at 03/16/21 2114   insulin aspart (novoLOG) injection 0-5 Units  0-5 Units Subcutaneous QHS Massengill, Harrold Donath, MD   2 Units at 03/15/21 2115   insulin aspart (novoLOG) injection 0-9 Units  0-9 Units Subcutaneous TID WC Massengill, Harrold Donath, MD   2 Units at 03/16/21 1730   insulin aspart (novoLOG) injection 10 Units  10 Units Subcutaneous TID WC Massengill, Harrold Donath, MD   10 Units at 03/16/21 1734   insulin glargine-yfgn (SEMGLEE) injection 40 Units  40 Units Subcutaneous Daily Princess Bruins, DO   40 Units at 03/17/21 0807   magnesium hydroxide (MILK OF MAGNESIA)  suspension 30 mL  30 mL Oral Daily PRN Rankin, Shuvon B, NP       sertraline (ZOLOFT) tablet 50 mg  50 mg Oral Daily Nkwenti, [name], NP   50 mg at 03/17/21 0803   traZODone (DESYREL) tablet 50 mg  50 mg Oral QHS,MR X 1 Massengill, Nathan, MD   50 mg at 03/16/21 2114   PTA Medications: Medications Prior to Admission  Medication Sig Dispense Refill Last Dose   atorvastatin (LIPITOR) 80 MG tablet Take 1 tablet (80 mg total) by mouth daily. For high cholesterol 30 tablet 0    buPROPion (WELLBUTRIN XL) 150 MG 24 hr tablet Take 1 tablet (150 mg total) by mouth daily. For depression (Patient not taking: Reported on 03/11/2021) 30 tablet 0    gabapentin (NEURONTIN) 100 MG capsule Take 1 capsule (100 mg total) by mouth 3 (three) times daily. For neuropathic pain (Patient not taking: Reported on 03/11/2021) 90 capsule 0    insulin glargine (LANTUS) 100 UNIT/ML injection Inject 0.3 mLs (30 Units total) into the skin daily. For diabetes management 10 mL 0    linagliptin (TRADJENTA) 5 MG TABS tablet Take 1 tablet (5 mg total) by mouth daily. For diabetes management (Patient not taking: Reported on 03/11/2021) 30 tablet 0    lisinopril-hydrochlorothiazide (ZESTORETIC) 20-25 MG tablet Take 1 tablet by mouth daily. For high  blood pressure 30 tablet 0    traZODone (DESYREL) 50 MG tablet Take 1 tablet (50 mg total) by mouth at bedtime as needed for sleep. (Patient not taking: Reported on 03/11/2021) 30 tablet 0     Patient Stressors: Financial difficulties   Loss of job and home   Marital or family conflict    Patient Strengths: Ability for insight  Active sense of humor  Average or above average intelligence  Capable of independent living  PrintmakerCommunication skills  Motivation for treatment/growth  Work skills   Treatment Modalities: Medication Management, Group therapy, Case management,  1 to 1 session with clinician, Psychoeducation, Recreational therapy.   Physician Treatment Plan for Primary Diagnosis:  MDD (major depressive disorder) Long Term Goal(s): Improvement in symptoms so as ready for discharge   Short Term Goals: Ability to identify and develop effective coping behaviors will improve Ability to maintain clinical measurements within normal limits will improve Compliance with prescribed medications will improve Ability to identify triggers associated with substance abuse/mental health issues will improve  Medication Management: Evaluate patient's response, side effects, and tolerance of medication regimen.  Therapeutic Interventions: 1 to 1 sessions, Unit Group sessions and Medication administration.  Evaluation of Outcomes: Progressing  Physician Treatment Plan for Secondary Diagnosis: Principal Problem:   MDD (major depressive disorder) Active Problems:   Cocaine use disorder, severe, dependence (HCC)   Suicidal ideation   Moderate cannabis use disorder (HCC)   GAD (generalized anxiety disorder)  Long Term Goal(s): Improvement in symptoms so as ready for discharge   Short Term Goals: Ability to identify and develop effective coping behaviors will improve Ability to maintain clinical measurements within normal limits will improve Compliance with prescribed medications will improve Ability to identify triggers associated with substance abuse/mental health issues will improve     Medication Management: Evaluate patient's response, side effects, and tolerance of medication regimen.  Therapeutic Interventions: 1 to 1 sessions, Unit Group sessions and Medication administration.  Evaluation of Outcomes: Progressing   RN Treatment Plan for Primary Diagnosis: MDD (major depressive disorder) Long Term Goal(s): Knowledge of disease and therapeutic regimen to maintain health will improve  Short Term Goals: Ability to participate in decision making will improve, Ability to verbalize feelings will improve, and Ability to disclose and discuss suicidal ideas  Medication Management:  RN will administer medications as ordered by provider, will assess and evaluate patient's response and provide education to patient for prescribed medication. RN will report any adverse and/or side effects to prescribing provider.  Therapeutic Interventions: 1 on 1 counseling sessions, Psychoeducation, Medication administration, Evaluate responses to treatment, Monitor vital signs and CBGs as ordered, Perform/monitor CIWA, COWS, AIMS and Fall Risk screenings as ordered, Perform wound care treatments as ordered.  Evaluation of Outcomes: Progressing   LCSW Treatment Plan for Primary Diagnosis: MDD (major depressive disorder) Long Term Goal(s): Safe transition to appropriate next level of care at discharge, Engage patient in therapeutic group addressing interpersonal concerns.  Short Term Goals: Engage patient in aftercare planning with referrals and resources, Increase emotional regulation, and Facilitate acceptance of mental health diagnosis and concerns  Therapeutic Interventions: Assess for all discharge needs, 1 to 1 time with Social worker, Explore available resources and support systems, Assess for adequacy in community support network, Educate family and significant other(s) on suicide prevention, Complete Psychosocial Assessment, Interpersonal group therapy.  Evaluation of Outcomes: Progressing   Progress in Treatment: Attending groups: Yes. Participating in groups: Yes. Taking medication as prescribed: Yes. Toleration medication: Yes. Family/Significant other contact  made: No, will contact:  Pt declined consents. Patient understands diagnosis: Yes. Discussing patient identified problems/goals with staff: Yes. Medical problems stabilized or resolved: Yes. Denies suicidal/homicidal ideation: Yes. Issues/concerns per patient self-inventory: No. Other: None  New problem(s) identified: No, Describe:  None  New Short Term/Long Term Goal(s):medication stabilization, elimination of SI  thoughts, development of comprehensive mental wellness plan.   Patient Goals:  "To get back on medicine, maintain, and be open-minded"  Discharge Plan or Barriers: Pt has been accepted for substance use treatment at [facility] in [place] for 03/18/2021 at 2pm.  Reason for Continuation of Hospitalization: Medication stabilization  Estimated Length of Stay: 3-5 days   Scribe for Treatment Team: Felizardo Hoffmann, Theresia Majors 03/17/2021 9:45 AM

## 2021-03-17 NOTE — Progress Notes (Signed)
°   03/16/21 2114  Psych Admission Type (Psych Patients Only)  Admission Status Voluntary  Psychosocial Assessment  Patient Complaints None  Eye Contact Fair  Facial Expression Pensive  Affect Appropriate to circumstance  Speech Logical/coherent  Interaction Assertive  Motor Activity Other (Comment) (WDL)  Appearance/Hygiene Unremarkable  Behavior Characteristics Cooperative;Appropriate to situation  Mood Anxious;Depressed  Thought Process  Coherency WDL  Content WDL  Delusions None reported or observed  Perception WDL  Hallucination None reported or observed  Judgment WDL  Confusion None  Danger to Self  Current suicidal ideation? Denies  Self-Injurious Behavior No self-injurious ideation or behavior indicators observed or expressed   Agreement Not to Harm Self Yes  Description of Agreement Verbal Contract  Danger to Others  Danger to Others None reported or observed

## 2021-03-17 NOTE — Progress Notes (Signed)
Pt denies SI/HI/AVH and verbally agrees to approach staff if these become apparent or before harming themselves/others. Rates depression 0/10. Rates anxiety 0/10. Rates pain 7/10 in shoulder.  Pt stated that he thinks that being here has helped him. Pt stated "I needed to be here." Scheduled medications administered to pt, per MD orders. RN provided support and encouragement to pt. Q15 min safety checks implemented and continued. Pt safe on the unit. RN will continue to monitor and intervene as needed.   03/17/21 0803  Psych Admission Type (Psych Patients Only)  Admission Status Voluntary  Psychosocial Assessment  Patient Complaints Other (Comment) (pain)  Eye Contact Fair  Facial Expression Other (Comment) (WDL)  Affect Appropriate to circumstance  Speech Logical/coherent  Interaction Assertive  Motor Activity Other (Comment) (WDL)  Appearance/Hygiene Unremarkable;In scrubs  Behavior Characteristics Cooperative;Appropriate to situation;Calm  Mood Pleasant;Euthymic  Aggressive Behavior  Effect No apparent injury  Thought Process  Coherency WDL  Content WDL  Delusions None reported or observed  Perception WDL  Hallucination None reported or observed  Judgment WDL  Confusion None  Danger to Self  Current suicidal ideation? Denies  Danger to Others  Danger to Others None reported or observed

## 2021-03-17 NOTE — Group Note (Signed)
LCSW Group Therapy Note   Group Date: 03/17/2021 Start Time: 1300 End Time: 1400  Type of Therapy and Topic: Group Therapy: Introduce Yourself   Participation Level:  Active  Description of Group: In this group, patients will learn quirky, random facts about each other. Patients will be given This group will be process-oriented and eductional, with patients participating in exploration of their own experiences as well as giving and receiving support and challenge from other group members.  Therapeutic Goals: Patient will learn to connect with others. Patient will learn to learn interesting facts about themselves. Patient will receive support and feedback from others  Summary of Patient Progress:  Patient shared during introductions that he likes that he is a kind person and can get along with anyone. Patient accepted and completed worksheet given at the start of the session. Patient listened when other group members spoke. Patient was appropriate throughout group and the discussion.   Therapeutic Modalities: Cognitive Behavioral Therapy Solution Focused Therapy Motivational Interviewing  Fredirick Lathe, [facility] Clinicial Social Worker [facility]    Felizardo Hoffmann, [place] 03/17/2021  1:57 PM

## 2021-03-18 DIAGNOSIS — F332 Major depressive disorder, recurrent severe without psychotic features: Principal | ICD-10-CM

## 2021-03-18 LAB — GLUCOSE, CAPILLARY
Glucose-Capillary: 140 mg/dL — ABNORMAL HIGH (ref 70–99)
Glucose-Capillary: 87 mg/dL (ref 70–99)
Glucose-Capillary: 99 mg/dL (ref 70–99)

## 2021-03-18 MED ORDER — LISINOPRIL 20 MG PO TABS: 20.0000 mg | ORAL_TABLET | Freq: Every day | ORAL | 1 refills | Status: DC

## 2021-03-18 MED ORDER — SERTRALINE HCL 50 MG PO TABS: 50.0000 mg | ORAL_TABLET | Freq: Every day | ORAL | 1 refills | Status: DC

## 2021-03-18 MED ORDER — HYDROCHLOROTHIAZIDE 25 MG PO TABS
50.0000 mg | ORAL_TABLET | Freq: Every day | ORAL | Status: DC
  Filled 2021-03-18: qty 14

## 2021-03-18 MED ORDER — HYDROCHLOROTHIAZIDE 50 MG PO TABS: 50.0000 mg | ORAL_TABLET | Freq: Every day | ORAL | 1 refills | Status: DC

## 2021-03-18 MED ORDER — INSULIN GLARGINE 100 UNIT/ML ~~LOC~~ SOLN
30.0000 [IU] | Freq: Every day | SUBCUTANEOUS | Status: DC
  Filled 2021-03-18 (×9): qty 0.3

## 2021-03-18 MED ORDER — CLONIDINE HCL 0.1 MG PO TABS: 0.1000 mg | ORAL_TABLET | Freq: Two times a day (BID) | ORAL | 1 refills | Status: DC

## 2021-03-18 NOTE — Progress Notes (Signed)
°  [facility] Adult Case Management Discharge Plan :  Will you be returning to the same living situation after discharge:  No.  Is going to a halfway house/program At discharge, do you have transportation home?: No.  [place] being arranged by CSW Do you have the ability to pay for your medications: No.  Has been referred to the [facility] for this  Release of information consent forms completed and emailed to Medical Records, then turned in to Medical Records by CSW.   Patient to Follow up at:  Follow-up Information     [facility]. Call.   Specialty: Hospice and Palliative Medicine Why: Please call personally to schedule an appointment for grief/bereavement therapy services. Contact information: [address] [place] [address] [phone]        [facility] Follow up.   Why: Please go to this facility on Wednesdays from 5pm - 8pm to get reduced cost/ free medications. Services are walk-in only, first come, first service. You must have your printed prescriptions. Contact information: [address] [place], [place] [address]  [phone]        [facility] Follow up.   Why: You have been accepted to this facility for substance use services on 03/18/20 at 2pm. Contact information: [address] [place], [address] Men's Program:  [phone]        [facility]. Go on 03/22/2021.   Why: You have a hospital follow up appointment for therapy and medication management services on 03/22/21 at 8:00 am.   This appointment will be held in person.  * ADDRESS:  [address] [place], [place]. Contact information: [address] [place] [address] [phone]         [place], [facility]. Call.   Why: You may call to inquire if this provider can provide grief/bereavement therapy services. Contact information: [address] [place] [address] [phone]         [facility] Follow up.   Why: Please call to schedule an appointment for primary care services. Contact information: [address] [place], [place], [place] [address]  P: [phone] F: [phone]                Next level of care provider has access to [facility] Link:no  Safety Planning and Suicide Prevention discussed: No.  Declined by patient     Has patient been referred to the Quitline?: Patient refused referral  Patient has been referred for addiction treatment: Yes  Lynnell Chad, LCSW 03/18/2021, 9:29 AM

## 2021-03-18 NOTE — BHH Group Notes (Signed)
Goals Group °03/18/21 ° ° ° °Group Focus: affirmation, clarity of thought, and goals/reality orientation °Treatment Modality:  Psychoeducation °Interventions utilized were assignment, group exercise, and support °Purpose: To be able to understand and verbalize the reason for their admission to the hospital. To understand that the medication helps with their chemical imbalance but they also need to work on their choices in life. To be challenged to develop a list of 30 positives about themselves. Also introduce the concept that "feelings" are not reality. ° °Participation Level:  did not attend ° ° °Troy Gordon A °

## 2021-03-18 NOTE — BHH Suicide Risk Assessment (Signed)
[facility] Discharge Suicide Risk Assessment   Principal Problem: MDD (major depressive disorder) Discharge Diagnoses: Principal Problem:   MDD (major depressive disorder) Active Problems:   Cocaine use disorder, severe, dependence (HCC)   Moderate cannabis use disorder (HCC)   GAD (generalized anxiety disorder)  Total Time Spent in Direct Patient Care:  I personally spent 35 minutes on the unit in direct patient care. The direct patient care time included face-to-face time with the patient, reviewing the patient's chart, communicating with other professionals, and coordinating care. Greater than 50% of this time was spent in counseling or coordinating care with the patient regarding goals of hospitalization, psycho-education, and discharge planning needs.  Subjective: Patient was seen on rounds.  He denies SI, HI, AVH, paranoia, or delusions.  He denies medication side effects and reports stable sleep and appetite.  He reports improved mood and voices no physical complaints.  He was advised to follow-up with an outpatient primary care provider for management of his hypercholesterolemia, diabetes, and hypertension.  He was encouraged to abstain from alcohol and illicit drug use and to go directly to his outpatient substance abuse program today as planned.  He can articulate a safety and discharge plan.  He was encouraged to keep outpatient mental health follow-up appointments after discharge and to comply with medications.  Musculoskeletal: Strength & Muscle Tone: within normal limits Gait & Station: normal Patient leans: N/A  Psychiatric Specialty Exam  Presentation  General Appearance: Appropriate for Environment; Casual; Fairly Groomed  Eye Contact:Good  Speech:Clear and Coherent; Normal Rate  Speech Volume:Normal  Handedness:Right   Mood and Affect  Mood:Euthymic  Duration of Depression Symptoms: Greater than two weeks  Affect:Appropriate; Congruent; Full Range   Thought  Process  Thought Processes:Linear, goal directed  Descriptions of Associations:Intact  Orientation:Full (Time, Place and Person)  Thought Content:Logical - denies AVH, paranoia or delusions  History of Schizophrenia/Schizoaffective disorder:No  Hallucinations:Denied  Ideas of Reference:None  Suicidal Thoughts:Denied  Homicidal Thoughts:Denied  Sensorium  Memory:Immediate Good; Recent Good; Remote Good  Judgment:Good  Insight:Good   Executive Functions  Concentration:Good  Attention Span:Good  Recall:Good  Fund of Knowledge:Good  Language:Good   Psychomotor Activity  Psychomotor Activity:Normal  Assets  Assets:Communication Skills; Desire for Improvement; Resilience; Talents/Skills   Sleep  6.5 hours  Physical Exam Vitals and nursing note reviewed.  Constitutional:      Appearance: Normal appearance.  HENT:     Head: Normocephalic.  Pulmonary:     Effort: Pulmonary effort is normal.  Neurological:     General: No focal deficit present.     Mental Status: He is alert.   Review of Systems  Respiratory:  Negative for shortness of breath.   Cardiovascular:  Negative for chest pain.  Gastrointestinal:  Negative for diarrhea, nausea and vomiting.  Blood pressure 131/84, pulse 62, temperature 97.7 F (36.5 C), resp. rate 19, height 5\' 8"  (1.727 m), weight 84.4 kg, SpO2 98 %. Body mass index is 28.28 kg/m.  Mental Status Per Nursing Assessment::   On Admission:  Suicidal ideation indicated by patient - resolved  Demographic Factors:  Male, unemployed  Loss Factors: Decrease in vocational status, Decline in physical health, and Financial problems/change in socioeconomic status  Historical Factors: Anniversary of important loss  Risk Reduction Factors:   Positive social support and Positive coping skills or problem solving skills  Continued Clinical Symptoms:  Alcohol/Substance Abuse/Dependencies Previous Psychiatric Diagnoses and  Treatments  Cognitive Features That Contribute To Risk:  None    Suicide Risk:  Mild:  There are no identifiable plans, no associated intent, mild dysphoria and related symptoms, good self-control (both objective and subjective assessment), few other risk factors, and identifiable protective factors, including available and accessible social support.   Follow-up Information     [facility]. Call.   Specialty: Hospice and Palliative Medicine Why: Please call personally to schedule an appointment for grief/bereavement therapy services. Contact information: [address] [place] [address] [phone]        [facility] Follow up.   Why: Please go to this facility on Wednesdays from 5pm - 8pm to get reduced cost/ free medications. Services are walk-in only, first come, first service. You must have your printed prescriptions. Contact information: [address] [place], [place] [address]  [phone]        [facility] Follow up.   Why: You have been accepted to this facility for substance use services on 03/18/20 at 2pm. Contact information: [address] [place], [address] Men's Program:  [phone]        [facility]. Go on 03/22/2021.   Why: You have a hospital follow up appointment for therapy and medication management services on 03/22/21 at 8:00 am.   This appointment will be held in person.  * ADDRESS:  [address] [place], [place]. Contact information: [address] [place] [address] [phone]         [place], [facility]. Call.   Why: You may call to inquire if this provider can provide grief/bereavement therapy services. Contact information: [address] [place] [address] [phone]         [facility] Follow up.   Why: Please call to schedule an appointment for primary care services. Contact information: [address] [place],  [place], [place] [address]  P: [phone] F: [phone]                Plan Of Care/Follow-up recommendations:  Activity:  as tolerated Diet:  heart healthy Other:  Patient advised to keep outpatient mental health and substance abuse follow up appointments and to comply with medications without fail. He was advised to see a primary care provider for management of his diabetes, high blood pressure, and elevated cholesterol without fail.He was encouraged to abstain from alcohol and illicit substance use after discharge.   Comer Locket, MD, FAPA 03/18/2021, 6:52 AM

## 2021-03-18 NOTE — Discharge Summary (Signed)
Physician Discharge Summary Note  Patient:  Troy Gordon is an 56 y.o., male MRN:  [id number] DOB:  Apr 15, 1965 Patient phone:  [phone] (home)  Patient address:   [facility] [place] [address],  Total Time Spent in Direct Patient Care: I personally spent 30 minutes on the unit in direct patient care. The direct patient care time included face-to-face time with the patient, reviewing the patient's chart, communicating with other professionals, and coordinating care. Greater than 50% of this time was spent in counseling or coordinating care with the patient regarding goals of hospitalization, psycho-education, and discharge planning needs.   Date of Admission:  03/12/2021 Date of Discharge: 03/18/2021  Reason for Admission:   Per H and P: Troy Gordon is a 56 year old male with a past history of MDD, cocaine & cannabis abuse, hypertension and Diabetes, who walked into [facility] ED with complaints of worsening suicidal ideations x 1 week, with a plan to jump off a bridge or into traffic. Pt was transferred to this [facility]) for treatment and stabilization of his mood.   Evaluation on unit Pt able to validate the above information, and reports that his depressive symptoms started 2 years ago after he lost both of his parents in a motor vehicle accident. He states that over the past week, his symptoms have worsened. He reports hopelessness, irritability, worthlessness, sadness, helplessness, anhedonia, frustration,decreased energy levels, decreased concentration, sadness, and insomnia, which have worsened over the course of three months. Pt reports that he is from [place], moved to [place] 14 months ago, and was able to seek mental health care at [facility] then. Pt reports that he was diagnosed with MDD and medications were ordered for him, but he stopped taking them 3 months ago because he stopped following up with [facility], and could not afford the medications. Pt  currently denies SI/HI/AVH, but states he started hearing voices three years ago, and last heard them/saw things 2 weeks ago. He states the voices call his name, and he sees someone moving across his room at home. Pt denies any history of trauma, denies any emotional/physical or sexual abuse, denies any self injurious/fire setting behaviors, and denies having any current legal issues.   Pt lists his current stressors as mostly financial; states his car needs an engine and he has no money to get it fix, and therefore cannot get transportation to look for work. Pt also reports that he is about to lose his housing because he owes $1800. 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 Pt reports that he was born and raised in [place], has 7 sisters, one brother, one son and three grandchildren. Pt reports his highest level of education as being high school. Pt reports a history of cocaine use which started 2 years ago after his parents died, and states that he uses it twice a month. Pt endorses daily marijuana use, and states he smokes one blunt/day. Pt states that he drinks 2-3 beers/week, and denies using tobacco products or any other recreational substances.   Pt calm and cooperative, has good eye contact and attention to personal hygiene and grooming is good as he recounts the events leading to this hospitalization. Mood is depressed, affect is flat and congruent, speech is clear and coherent, thought contents are logical. Pt denies paranoia, and there are currently no delusional thoughts. Pt reports that his appetite is good, reports poor sleep last night, states that it was  related to staff open the door multiple times to do checks. Pt has been educated that staff will be notified to leave his door slightly open so that checks can be completed without disrupting his sleep. Pt agreeable to this. B/P is currently elevated at 149/95, HR-88. Pt restarted on his BP  medications. Will monitor. TSH, baseline EKG, Baseline UA ordered. Pt reports right shoulder pain with diminished range of motion to right arm, stating he fell 5 months ago on the right side. He complains of pain of 7.5 to this extremity. Ibuprofen 400 mg Q 6 H PRN ordered. Hemoglobin A1C is 10.7, pt educated on making healthier choices during meals.  Pt on insulin Glargine 30 units daily and  on scheduled Novolog TID and also on sliding scale Insulin Novolog coverage three times a day with meals. Endocrinologist consult placed.   Principal Problem: MDD (major depressive disorder) Discharge Diagnoses: Principal Problem:   MDD (major depressive disorder) Active Problems:   Cocaine use disorder, severe, dependence (HCC)   Moderate cannabis use disorder (HCC)   GAD (generalized anxiety disorder)   Past Psychiatric History: MDD, cocaine & cannabis abuse  Past Medical History:  Past Medical History:  Diagnosis Date   Depression    Diabetes mellitus (HCC)    Dyslipidemia    Hyperlipidemia    Hypertension     Past Surgical History:  Procedure Laterality Date   CIRCUMCISION     as an adult   Family History:  Family History  Problem Relation Age of Onset   Diabetes Maternal Grandmother    Hypertension Maternal Grandmother    Hypertension Father    Family Psychiatric  History: unknown Social History:  Social History   Substance and Sexual Activity  Alcohol Use Not Currently   Comment: occasionally beer     Social History   Substance and Sexual Activity  Drug Use Yes   Types: Marijuana, Cocaine   Comment: last used crack 06/07/20    Social History   Socioeconomic History   Marital status: Single    Spouse name: Not on file   Number of children: Not on file   Years of education: Not on file   Highest education level: Not on file  Occupational History   Not on file  Tobacco Use   Smoking status: Former    Years: 1.00    Types: Cigarettes   Smokeless tobacco: Never    Tobacco comments:    1 cig every 5 weeks  Vaping Use   Vaping Use: Never used  Substance and Sexual Activity   Alcohol use: Not Currently    Comment: occasionally beer   Drug use: Yes    Types: Marijuana, Cocaine    Comment: last used crack 06/07/20   Sexual activity: Not on file  Other Topics Concern   Not on file  Social History Narrative   Not on file   Social Determinants of Health   Financial Resource Strain: Not on file  Food Insecurity: Not on file  Transportation Needs: Not on file  Physical Activity: Not on file  Stress: Not on file  Social Connections: Not on file    Hospital Course:   The patient was admitted to the behavioral health hospital for worsening depression and suicidal thoughts with a plan. He was given psychiatric diagnoses of MDD and GAD, as well as cannabis and cocaine use disorder. His home Wellbutrin XL was restarted at his previous dose of 150 mg. Zoloft was added and titrated to 50  mg. He tolerated this medication well. He was significantly hypertensive so his blood pressure medicines were adjusted, including increasing his HCTZ to 50 mg and starting clonidine 0.1 mg BID. His blood pressure improved significantly by the time of discharge. While on the unit he consistently denied suicidal thoughts and his mood was noted to have improved significantly. He was discharged with residential rehab in place in [place].    During the course of his hospitalization, the 15-minute checks were adequate to ensure patient's safety. The patient did not display any dangerous, violent or suicidal behavior on the unit.  The patient interacted with patients & staff appropriately, participated appropriately in the group sessions/therapies. The patient's medications were addressed & adjusted to meet his needs. The patient was recommended for outpatient follow-up care & medication management upon discharge to assure continuity of care & mood stability.    Today upon their  discharge evaluation with the attending psychiatrist, the patient shares they are doing well and that they feel ready for discharge. The patient denies any other specific concerns. The patient is sleeping well. Their appetite is good. The patient denies other physical complaints. The patient denies AH/VH, delusional thoughts or paranoia. The patient does not appear to be responding to any internal stimuli. The patient feels that their medications have been helpful & is in agreement to continue their current treatment regimen as recommended. The patient was able to engage in safety planning including plan to return to [facility] or contact emergency services if the patient feels unable to maintain their own safety or the safety of others. Pt had no further questions, comments, or concerns. The patient left [facility] with all personal belongings in no apparent distress.   Disposition: to residential rehab   Physical Findings:  Musculoskeletal: Strength & Muscle Tone: within normal limits Gait & Station: normal Patient leans: N/A   Psychiatric Specialty Exam:  Presentation  General Appearance: Appropriate for Environment; Casual; Fairly Groomed  Eye Contact:Good  Speech:Clear and Coherent; Normal Rate  Speech Volume:Normal  Handedness:Right   Mood and Affect  Mood:Euthymic  Affect:Appropriate; Congruent; Full Range   Thought Process  Thought Processes:Linear  Descriptions of Associations:Intact  Orientation:Full (Time, Place and Person)  Thought Content:Logical  History of Schizophrenia/Schizoaffective disorder:No  Duration of Psychotic Symptoms:N/A  Hallucinations: none Ideas of Reference:None  Suicidal Thoughts: denied Homicidal Thoughts: denied  Sensorium  Memory:Immediate Good; Recent Good; Remote Good  Judgment:Good  Insight:Good   Executive Functions  Concentration:Good  Attention Span:Good  Recall:Good  Fund of Knowledge:Good  Language:Good   Psychomotor  Activity  Psychomotor Activity:No data recorded  Assets  Assets:Communication Skills; Desire for Improvement; Resilience; Talents/Skills   Sleep  Sleep: fair  Physical Exam: Physical Exam Constitutional:      Appearance: He is not toxic-appearing.  Pulmonary:     Effort: Pulmonary effort is normal.  Neurological:     Mental Status: He is alert and oriented to person, place, and time.   Review of Systems  Respiratory:  Negative for shortness of breath.   Cardiovascular:  Negative for chest pain.  Gastrointestinal:  Negative for diarrhea, nausea and vomiting.  Blood pressure 131/84, pulse 62, temperature 97.7 F (36.5 C), resp. rate 19, height 5\' 8"  (1.727 m), weight 84.4 kg, SpO2 98 %. Body mass index is 28.28 kg/m.   Social History   Tobacco Use  Smoking Status Former   Years: 1.00   Types: Cigarettes  Smokeless Tobacco Never  Tobacco Comments   1 cig every 5 weeks  Tobacco Cessation:  N/A, patient does not currently use tobacco products   Blood Alcohol level:  Lab Results  Component Value Date   ETH <10 03/11/2021   ETH <10 10/24/2020    Metabolic Disorder Labs:  Lab Results  Component Value Date   HGBA1C 10.7 (H) 03/13/2021   MPG 260.39 03/13/2021   MPG 277.61 10/24/2020   No results found for: PROLACTIN Lab Results  Component Value Date   CHOL 274 (H) 03/13/2021   TRIG 390 (H) 03/13/2021   HDL 45 03/13/2021   CHOLHDL 6.1 03/13/2021   VLDL 78 (H) 03/13/2021   LDLCALC 151 (H) 03/13/2021   LDLCALC 222 (H) 10/26/2020    See Psychiatric Specialty Exam and Suicide Risk Assessment completed by Attending Physician prior to discharge.  Discharge destination[place]  [facility] [place]  Is patient on multiple antipsychotic therapies at discharge:  No   Has Patient had three or more failed trials of antipsychotic monotherapy by history:  No  Recommended Plan for Multiple Antipsychotic Therapies: NA  Discharge Instructions     Diet - low  sodium heart healthy   Complete by: As directed    Increase activity slowly   Complete by: As directed       Allergies as of 03/18/2021   No Known Allergies      Medication List     STOP taking these medications    gabapentin 100 MG capsule Commonly known as: NEURONTIN   linagliptin 5 MG Tabs tablet Commonly known as: TRADJENTA   lisinopril-hydrochlorothiazide 20-25 MG tablet Commonly known as: ZESTORETIC   traZODone 50 MG tablet Commonly known as: DESYREL       TAKE these medications      Indication  atorvastatin 80 MG tablet Commonly known as: LIPITOR Take 1 tablet (80 mg total) by mouth daily. For high cholesterol  Indication: High Amount of Triglycerides in the Blood, Elevation of Both Cholesterol and Triglycerides in Blood   buPROPion 150 MG 24 hr tablet Commonly known as: Wellbutrin XL Take 1 tablet (150 mg total) by mouth daily. For depression  Indication: Major Depressive Disorder   cloNIDine 0.1 MG tablet Commonly known as: CATAPRES Take 1 tablet (0.1 mg total) by mouth 2 (two) times daily.  Indication: High Blood Pressure Disorder   hydrochlorothiazide 50 MG tablet Commonly known as: HYDRODIURIL Take 1 tablet (50 mg total) by mouth daily. Start taking on: March 19, 2021  Indication: High Blood Pressure Disorder   insulin glargine 100 UNIT/ML injection Commonly known as: LANTUS Inject 0.3 mLs (30 Units total) into the skin daily. For diabetes management  Indication: Type 2 Diabetes   lisinopril 20 MG tablet Commonly known as: ZESTRIL Take 1 tablet (20 mg total) by mouth daily. Start taking on: March 19, 2021  Indication: High Blood Pressure Disorder   sertraline 50 MG tablet Commonly known as: ZOLOFT Take 1 tablet (50 mg total) by mouth daily. Start taking on: March 19, 2021  Indication: Generalized Anxiety Disorder        Follow-up Information     [facility]. Call.   Specialty: Hospice and Palliative Medicine Why:  Please call personally to schedule an appointment for grief/bereavement therapy services. Contact information: [address] [place] [id number] [phone]        [facility] Follow up.   Why: Please go to this facility on Wednesdays from 5pm - 8pm to get reduced cost/ free medications. Services are walk-in only, first come, first service. You must  have your printed prescriptions. Contact information: [address] [place], [place] [address]  [phone]        [facility] Follow up.   Why: You have been accepted to this facility for substance use services on 03/18/20 at 2pm. Contact information: [address], [place], [id number] Men's Program:  [phone]        Services, [facility]. Go on 03/22/2021.   Why: You have a hospital follow up appointment for therapy and medication management services on 03/22/21 at 8:00 am.   This appointment will be held in person.  * ADDRESS:  [address] [place], [place]. Contact information: [address] [place] [address] [phone]         [facility]. Call.   Why: You may call to inquire if this provider can provide grief/bereavement therapy services. Contact information: [address] [place] [address] [phone]         [facility] Follow [facility].   Why: Please call to schedule an appointment for primary care services. Contact information: [address], [place], [place] [address]  P: [phone] F: [phone]                Follow-up recommendations:   Activity as tolerated. Diet as recommended by PCP. Keep all scheduled follow-up appointments as recommended.  Patient is instructed to take all prescribed medications as recommended. Report any side effects or adverse reactions to your outpatient psychiatrist. Patient is instructed to abstain from alcohol and illegal drugs while on prescription  medications. In the event of worsening symptoms, patient is instructed to call the crisis hotline, 911, or go to the nearest emergency department for evaluation and treatment.  Prescriptions given at discharge. Patient agreeable to plan. Given opportunity to ask questions. Appears to feel comfortable with discharge.  Patient is also instructed prior to discharge to: Take all medications as prescribed by mental healthcare provider. Report any adverse effects and or reactions from the medicines to outpatient provider promptly. Patient has been instructed & cautioned: To not engage in alcohol and or illegal drug use while on prescription medicines. In the event of worsening symptoms,  patient is instructed to call the crisis hotline, 911 and or go to the nearest ED for appropriate evaluation and treatment of symptoms. To follow-up with primary care provider for other medical issues, concerns and or health care needs  The patient was evaluated each day by a clinical provider to ascertain response to treatment. Improvement was noted by the patient's report of decreasing symptoms, improved sleep and appetite, affect, medication tolerance, behavior, and participation in unit programming.  Patient was asked each day to complete a self inventory noting mood, mental status, pain, new symptoms, anxiety and concerns.  Patient responded well to medication and being in a therapeutic and supportive environment. Positive and appropriate behavior was noted and the patient was motivated for recovery. The patient worked closely with the treatment team and case manager to develop a discharge plan with appropriate goals. Coping skills, problem solving as well as relaxation therapies were also part of the unit programming.  By the day of discharge patient was in much improved condition than upon admission.  Symptoms were reported as significantly decreased or resolved completely. The patient was motivated to continue taking  medication with a goal of continued improvement in mental health.    Comments:  NA  Signed: Carlyn ReichertNick Reygan Heagle, MD PGY-1

## 2021-03-18 NOTE — Progress Notes (Addendum)
°   03/17/21 2000  Psych Admission Type (Psych Patients Only)  Admission Status Voluntary  Psychosocial Assessment  Patient Complaints Other (Comment) (R shoulder pain)  Eye Contact Fair  Facial Expression Other (Comment) (wnl)  Affect Appropriate to circumstance  Speech Logical/coherent  Interaction Assertive  Motor Activity Other (Comment) (wnl)  Appearance/Hygiene Unremarkable;In scrubs  Behavior Characteristics Cooperative  Mood Pleasant  Thought Process  Coherency WDL  Content WDL  Delusions None reported or observed  Perception WDL  Hallucination None reported or observed  Judgment WDL  Confusion None  Danger to Self  Current suicidal ideation? Denies  Danger to Others  Danger to Others None reported or observed   Pt seen on his way to evening wrap up group. Pt denies SI, HI, AVH. Pt having pain in his right shoulder rated 7/10. Pt denies anxiety and depression. Pt CBG =167. Pt BP elevated this evening 165/107, pulse 74. "I think it's the salt in the food here. At dinner you could practically taste it everywhere." No other concerns expressed.

## 2021-03-18 NOTE — Group Note (Signed)
LCSW Group Therapy Note  03/18/2021   10:00-11:00am   Type of Therapy and Topic:  Group Therapy: Anger Cues and Responses  Participation Level:  Did Not Attend   Description of Group:   In this group, patients learned how to recognize the physical, cognitive, emotional, and behavioral responses they have to anger-provoking situations.  They identified a recent time they became angry and how they reacted.  They analyzed how their reaction was possibly beneficial and how it was possibly unhelpful.  The group discussed a variety of healthier coping skills that could help with such a situation in the future.  Focus was placed on how helpful it is to recognize the underlying emotions to our anger, because working on those can lead to a more permanent solution as well as our ability to focus on the important rather than the urgent.  Therapeutic Goals: Patients will remember their last incident of anger and how they felt emotionally and physically, what their thoughts were at the time, and how they behaved. Patients will identify how their behavior at that time worked for them, as well as how it worked against them. Patients will explore possible new behaviors to use in future anger situations. Patients will learn that anger itself is normal and cannot be eliminated, and that healthier reactions can assist with resolving conflict rather than worsening situations.  Summary of Patient Progress:  Pt was invited to group; did not attend.    Veva Holes, LCSWA 03/18/2021  3:42 PM

## 2021-03-18 NOTE — Progress Notes (Signed)
Troy Gordon completed survey. RN met with Troy Gordon and reviewed Troy Gordon's discharge instructions. Troy Gordon verbalized understanding of discharge instructions and Troy Gordon did not have any questions. RN reviewed and provided Troy Gordon with a copy of SRA, AVS and Transition Record. RN returned Troy Gordon's belongings to Troy Gordon. Prescriptions and samples were given to Troy Gordon. Troy Gordon denied SI/HI/AVH and voiced no concerns. Troy Gordon excited to be going to treatment center. Troy Gordon was appreciative of the care Troy Gordon received at [facility]. Patient discharged to the lobby without incident.   03/18/21 0900  Psych Admission Type (Psych Patients Only)  Admission Status Voluntary  Psychosocial Assessment  Patient Complaints None  Eye Contact Fair  Facial Expression Other (Comment) (WDL)  Affect Appropriate to circumstance  Speech Logical/coherent  Interaction Assertive  Motor Activity Other (Comment) (WDL)  Appearance/Hygiene Unremarkable  Behavior Characteristics Cooperative;Appropriate to situation;Calm  Mood Pleasant  Aggressive Behavior  Effect No apparent injury  Thought Process  Coherency WDL  Content WDL  Delusions None reported or observed  Perception WDL  Hallucination None reported or observed  Judgment WDL  Confusion None  Danger to Self  Current suicidal ideation? Denies  Danger to Others  Danger to Others None reported or observed

## 2021-03-18 NOTE — Group Note (Signed)
Date:  03/18/2021 Time:  9:35 AM  Group Topic/Focus:  Orientation:   The focus of this group is to educate the patient on the purpose and policies of crisis stabilization and provide a format to answer questions about their admission.  The group details unit policies and expectations of patients while admitted.    Participation Level:  Did Not Attend  Participation Quality:    Affect:    Cognitive:    Insight:   Engagement in Group:    Modes of Intervention:    Additional Comments:  Pt did not attend group.  Garvin Fila 03/18/2021, 9:35 AM

## 2021-03-18 NOTE — [facility] Group Notes (Signed)
CT attended AA group with a positive attitude. CT also contributed to the group.

## 2021-03-23 NOTE — Congregational Nurse Program (Signed)
Pt attended [facility] to complete enrollment with [facility] on 1.12. 23  Pt states he recently was released from hospital and is in need of followup and an continuing management of his medical care   Pt Needs/Resources identified -[facility] Thurs. 1.19. 2.23 @ 10:30am  -Interpersonal safety at risk related to emotional stress (referred to get setup up with [facility]site therapist at [facility] by making provider aware of interest.  -Pt was Referred jn to RN Nurse Case Manager, Norval Gable to provide ongoing medical case management needs and/or other SDOH needs identified during today's visit

## 2021-03-30 ENCOUNTER — Ambulatory Visit: Payer: Medicaid Other | Admitting: Physician Assistant

## 2021-03-30 NOTE — Congregational Nurse Program (Signed)
°  Dept: [phone]   Congregational Nurse Program Note  Date of Encounter: 03/30/2021  Past Medical History: Past Medical History:  Diagnosis Date   Depression    Diabetes mellitus ([place])    Dyslipidemia    Hyperlipidemia    Hypertension     Encounter Details:   Client of [facility], confirmed with [employer] that client is ineligible for the [employer], copies placed in [place] record documents. Have also emailed [facility] application along with documents for proof of No insurance for [facility] to review.  Discussed with client [place] with client and the plan is to go for walk in intake on Friday Morning to re-establish care. Instructed client to let them know he will be running out of his medications. Client reports understanding.  [place] [employer]

## 2021-04-06 ENCOUNTER — Telehealth: Payer: Self-pay

## 2021-04-06 ENCOUNTER — Ambulatory Visit: Payer: Medicaid Other | Admitting: Physician Assistant

## 2021-04-06 NOTE — Telephone Encounter (Signed)
Attempted to call [facility] Connect Client  , No Answer. Left Message requesting ASAP return call. Also sent text to client. Reason: Need to reschedule appointment at [facility].  Will plan to reschedule and review amounts of medications client has at this time and if any provisions are needed regarding medications before he can be seen at [facility].  Francee Nodal RN [employer]

## 2021-04-26 ENCOUNTER — Ambulatory Visit: Payer: Medicaid Other | Admitting: Physician Assistant

## 2021-04-26 ENCOUNTER — Encounter: Payer: Self-pay | Admitting: Physician Assistant

## 2021-04-26 VITALS — BP 143/89 | HR 89 | Temp 97.8°F | Ht 68.0 in | Wt 198.0 lb

## 2021-04-26 DIAGNOSIS — E1165 Type 2 diabetes mellitus with hyperglycemia: Secondary | ICD-10-CM

## 2021-04-26 DIAGNOSIS — F489 Nonpsychotic mental disorder, unspecified: Secondary | ICD-10-CM

## 2021-04-26 DIAGNOSIS — E785 Hyperlipidemia, unspecified: Secondary | ICD-10-CM

## 2021-04-26 DIAGNOSIS — Z7689 Persons encountering health services in other specified circumstances: Secondary | ICD-10-CM

## 2021-04-26 DIAGNOSIS — I10 Essential (primary) hypertension: Secondary | ICD-10-CM

## 2021-04-26 MED ORDER — LISINOPRIL 20 MG PO TABS: 20.0000 mg | ORAL_TABLET | Freq: Every day | ORAL | 0 refills | Status: DC

## 2021-04-26 MED ORDER — SILDENAFIL CITRATE 100 MG PO TABS: 50.0000 mg | ORAL_TABLET | Freq: Every day | ORAL | 0 refills | Status: DC | PRN

## 2021-04-26 MED ORDER — HYDROCHLOROTHIAZIDE 25 MG PO TABS: 25.0000 mg | ORAL_TABLET | Freq: Every day | ORAL | 0 refills | Status: DC

## 2021-04-26 MED ORDER — AMLODIPINE BESYLATE 5 MG PO TABS: 5.0000 mg | ORAL_TABLET | Freq: Every day | ORAL | 0 refills | Status: DC

## 2021-04-26 MED ORDER — ATORVASTATIN CALCIUM 80 MG PO TABS: 80.0000 mg | ORAL_TABLET | Freq: Every day | ORAL | 0 refills | Status: DC

## 2021-04-26 NOTE — Patient Instructions (Signed)
COVID-19 vaccination significantly lowers your risk of severe illness, hospitalization, and death if you get infected. Compared to people who are up to date with their COVID-19 vaccinations, unvaccinated people aremore likely to get COVID-19, much more likely to be hospitalized with COVID-19, and much more likely to die from COVID-19. ?Like all vaccines, COVID-19 vaccines are not 100% effective at preventing infection. Some people who are up to date with their COVID-19 vaccinations will get COVID-19 breakthrough infection. However, staying up to date with your COVID-19 vaccinations means that you are less likely to have a breakthrough infection and, if you do get sick, you are less likely to get severely ill or die. Staying up to date with COVID-19 vaccination also means you are less likely to spread the disease to others and increases your protection against new variants of SARS-CoV-2, the virus that causes COVID-19. ? ?

## 2021-04-26 NOTE — Progress Notes (Signed)
BP (!) 143/89    Pulse 89    Temp 97.8 F (36.6 C)    Ht 5\' 8"  (1.727 m)    Wt 198 lb (89.8 kg)    SpO2 99%    BMI 30.11 kg/m    Subjective:    Patient ID: Troy Gordon, male    DOB: 1965-04-01, 56 y.o.   MRN: [id number]  HPI: Troy Gordon is a 56 y.o. male presenting on 04/26/2021 for New Patient (Initial Visit)   HPI  Chief Complaint  Patient presents with   New Patient (Initial Visit)    Pt is 55yoM who presents to re-establish care.  He was Last seen here 04/18/20- for suspected covid.  Before that appointment, he was last seen 12/17/19.    Pt was dismissed due to repeated no-shows.    He is Going to [facility] for MH issues.  He has been to hospital three times for SI over the last year.  He was discharged from behavioral hospital 03/18/21.    He Just started a new job- at 05/16/21.-  and thinks his insurance starts April  He is using 30 units lantus.  A1c on 03/13/21 10.7  He has No history of heart problems.  He exercises regularly and lifts weights.  He has no CP or SOB when he exercises.  Pt says he is feeling great and feels like he has things on the right track.  He has no complaints today.      Relevant past medical, surgical, family and social history reviewed and updated as indicated. Interim medical history since our last visit reviewed. Allergies and medications reviewed and updated.   Current Outpatient Medications:    atorvastatin (LIPITOR) 80 MG tablet, Take 1 tablet (80 mg total) by mouth daily. For high cholesterol, Disp: 30 tablet, Rfl: 0   buPROPion (WELLBUTRIN XL) 150 MG 24 hr tablet, Take 1 tablet (150 mg total) by mouth daily. For depression, Disp: 30 tablet, Rfl: 0   cloNIDine (CATAPRES) 0.1 MG tablet, Take 1 tablet (0.1 mg total) by mouth 2 (two) times daily., Disp: 60 tablet, Rfl: 1   hydrochlorothiazide (HYDRODIURIL) 50 MG tablet, Take 1 tablet (50 mg total) by mouth daily., Disp: 30 tablet, Rfl: 1   insulin glargine (LANTUS) 100 UNIT/ML injection,  Inject 0.3 mLs (30 Units total) into the skin daily. For diabetes management, Disp: 10 mL, Rfl: 0   lisinopril (ZESTRIL) 20 MG tablet, Take 1 tablet (20 mg total) by mouth daily., Disp: 30 tablet, Rfl: 1   sertraline (ZOLOFT) 50 MG tablet, Take 1 tablet (50 mg total) by mouth daily., Disp: 30 tablet, Rfl: 1    Review of Systems  Per HPI unless specifically indicated above     Objective:    BP (!) 143/89    Pulse 89    Temp 97.8 F (36.6 C)    Ht 5\' 8"  (1.727 m)    Wt 198 lb (89.8 kg)    SpO2 99%    BMI 30.11 kg/m   Wt Readings from Last 3 Encounters:  04/26/21 198 lb (89.8 kg)  03/11/21 154 lb 15.7 oz (70.3 kg)  10/24/20 185 lb (83.9 kg)    Physical Exam Vitals reviewed.  Constitutional:      General: He is not in acute distress.    Appearance: He is well-developed. He is not ill-appearing.  HENT:     Head: Normocephalic and atraumatic.     Right Ear: Tympanic membrane, ear canal and external ear  normal.     Left Ear: Tympanic membrane, ear canal and external ear normal.  Eyes:     Extraocular Movements: Extraocular movements intact.     Conjunctiva/sclera: Conjunctivae normal.     Pupils: Pupils are equal, round, and reactive to light.  Neck:     Thyroid: No thyromegaly.  Cardiovascular:     Rate and Rhythm: Normal rate and regular rhythm.  Pulmonary:     Effort: Pulmonary effort is normal.     Breath sounds: Normal breath sounds. No wheezing or rales.  Abdominal:     General: Bowel sounds are normal.     Palpations: Abdomen is soft. There is no mass.     Tenderness: There is no abdominal tenderness.  Musculoskeletal:     Cervical back: Neck supple.     Right lower leg: No edema.     Left lower leg: No edema.  Lymphadenopathy:     Cervical: No cervical adenopathy.  Skin:    General: Skin is warm and dry.     Findings: No rash.  Neurological:     Mental Status: He is alert and oriented to person, place, and time.     Motor: No weakness or tremor.     Gait:  Gait is intact.  Psychiatric:        Behavior: Behavior normal.          Assessment & Plan:    Encounter Diagnoses  Name Primary?   Encounter to establish care Yes   Primary hypertension    Uncontrolled type 2 diabetes mellitus with hyperglycemia (HCC)    Hyperlipidemia, unspecified hyperlipidemia type    Mental health problem       -Rx viagra per pt request -No labs now (recent labs reviewed.   only needs psa to be up to date. -colon cancer screening is due.  Most recent  FIT test 12/28/19.     -Pt to Continue with [facility] -Add amlodipine.  Cut back hctz -pt to continue other meds -pt is educated and encouraged to get covid vaccination -pt to follow up 6 wk (two weeks to get meds in the mail and 4 weeks to be on the new med).   Pt will Check on insurance.  he needs labs before next appointment.  He is to contact office if needed before appointment

## 2021-05-02 ENCOUNTER — Telehealth: Payer: Self-pay

## 2021-05-02 NOTE — Telephone Encounter (Signed)
Called to follow up with client today after his first visit to [facility]. He has received is medications from [facility] that [facility] provider sent in, exception is his insulin. He states that will come to the clinic. Recommended client call if he has not heard from the clinic prior to running out of insulin. He states understanding. He has started a new job and he is trying to save for a vehicle and he is asking about any names of boarding houses in town as it states it has become more difficult to live with the friend he is staying with currently.  Will plan to ask about local boarding houses and notify client of those.  Will continue to follow.  [place] [employer]

## 2021-05-17 ENCOUNTER — Other Ambulatory Visit: Payer: Self-pay | Admitting: Physician Assistant

## 2021-05-17 DIAGNOSIS — E785 Hyperlipidemia, unspecified: Secondary | ICD-10-CM

## 2021-05-17 DIAGNOSIS — I10 Essential (primary) hypertension: Secondary | ICD-10-CM

## 2021-05-17 DIAGNOSIS — E1165 Type 2 diabetes mellitus with hyperglycemia: Secondary | ICD-10-CM

## 2021-05-18 ENCOUNTER — Telehealth: Payer: Self-pay

## 2021-05-18 NOTE — Telephone Encounter (Signed)
Called pt to inform insulin delivered, left vm to call back °

## 2021-05-30 ENCOUNTER — Encounter (HOSPITAL_COMMUNITY): Payer: Self-pay | Admitting: *Deleted

## 2021-05-30 ENCOUNTER — Emergency Department (HOSPITAL_COMMUNITY)
Admission: EM | Admit: 2021-05-30 | Discharge: 2021-06-03 | Disposition: A | Discharge status: Home / Self Care | Payer: Medicaid Other | Attending: Emergency Medicine | Admitting: Emergency Medicine

## 2021-05-30 DIAGNOSIS — F1490 Cocaine use, unspecified, uncomplicated: Secondary | ICD-10-CM

## 2021-05-30 DIAGNOSIS — E1165 Type 2 diabetes mellitus with hyperglycemia: Secondary | ICD-10-CM | POA: Insufficient documentation

## 2021-05-30 DIAGNOSIS — R45851 Suicidal ideations: Secondary | ICD-10-CM | POA: Insufficient documentation

## 2021-05-30 DIAGNOSIS — F1994 Other psychoactive substance use, unspecified with psychoactive substance-induced mood disorder: Secondary | ICD-10-CM

## 2021-05-30 DIAGNOSIS — Z79899 Other long term (current) drug therapy: Secondary | ICD-10-CM | POA: Insufficient documentation

## 2021-05-30 DIAGNOSIS — Z8639 Personal history of other endocrine, nutritional and metabolic disease: Secondary | ICD-10-CM

## 2021-05-30 DIAGNOSIS — Z20822 Contact with and (suspected) exposure to covid-19: Secondary | ICD-10-CM | POA: Insufficient documentation

## 2021-05-30 DIAGNOSIS — I10 Essential (primary) hypertension: Secondary | ICD-10-CM | POA: Insufficient documentation

## 2021-05-30 DIAGNOSIS — F332 Major depressive disorder, recurrent severe without psychotic features: Secondary | ICD-10-CM | POA: Diagnosis present

## 2021-05-30 DIAGNOSIS — R739 Hyperglycemia, unspecified: Secondary | ICD-10-CM

## 2021-05-30 DIAGNOSIS — F1420 Cocaine dependence, uncomplicated: Secondary | ICD-10-CM | POA: Diagnosis present

## 2021-05-30 DIAGNOSIS — Z87891 Personal history of nicotine dependence: Secondary | ICD-10-CM | POA: Insufficient documentation

## 2021-05-30 DIAGNOSIS — Z794 Long term (current) use of insulin: Secondary | ICD-10-CM | POA: Insufficient documentation

## 2021-05-30 DIAGNOSIS — F329 Major depressive disorder, single episode, unspecified: Secondary | ICD-10-CM | POA: Insufficient documentation

## 2021-05-30 LAB — CBG MONITORING, ED
Glucose-Capillary: >600 mg/dL (ref 70–99)
Glucose-Capillary: 479 mg/dL — ABNORMAL HIGH (ref 70–99)
Glucose-Capillary: 385 mg/dL — ABNORMAL HIGH (ref 70–99)
Glucose-Capillary: 207 mg/dL — ABNORMAL HIGH (ref 70–99)
Glucose-Capillary: 165 mg/dL — ABNORMAL HIGH (ref 70–99)
Glucose-Capillary: 160 mg/dL — ABNORMAL HIGH (ref 70–99)

## 2021-05-30 LAB — SALICYLATE LEVEL: Salicylate Lvl: <7 mg/dL — ABNORMAL LOW (ref 7.0–30.0)

## 2021-05-30 LAB — BASIC METABOLIC PANEL
Anion gap: 9 (ref 5–15)
BUN: 16 mg/dL (ref 6–20)
CO2: 25 mmol/L (ref 22–32)
Calcium: 8.6 mg/dL — ABNORMAL LOW (ref 8.9–10.3)
Chloride: 104 mmol/L (ref 98–111)
Creatinine, Ser: 1.3 mg/dL — ABNORMAL HIGH (ref 0.61–1.24)
GFR, Estimated: >60 mL/min (ref >60)
Glucose, Bld: 179 mg/dL — ABNORMAL HIGH (ref 70–99)
Potassium: 3.3 mmol/L — ABNORMAL LOW (ref 3.5–5.1)
Sodium: 138 mmol/L (ref 135–145)

## 2021-05-30 LAB — RAPID URINE DRUG SCREEN, HOSP PERFORMED
Amphetamines: NOT DETECTED
Barbiturates: NOT DETECTED
Benzodiazepines: NOT DETECTED
Cocaine: POSITIVE — AB
Opiates: NOT DETECTED
Tetrahydrocannabinol: NOT DETECTED

## 2021-05-30 LAB — CBC WITH DIFFERENTIAL/PLATELET
Abs Immature Granulocytes: 0.05 10*3/uL (ref 0.00–0.07)
Basophils Absolute: 0 10*3/uL (ref 0.0–0.1)
Basophils Relative: 1 %
Eosinophils Absolute: 0.1 10*3/uL (ref 0.0–0.5)
Eosinophils Relative: 1 %
HCT: 41.5 % (ref 39.0–52.0)
Hemoglobin: 14.3 g/dL (ref 13.0–17.0)
Immature Granulocytes: 1 %
Lymphocytes Relative: 38 %
Lymphs Abs: 3.1 10*3/uL (ref 0.7–4.0)
MCH: 27.7 pg (ref 26.0–34.0)
MCHC: 34.5 g/dL (ref 30.0–36.0)
MCV: 80.3 fL (ref 80.0–100.0)
Monocytes Absolute: 0.5 10*3/uL (ref 0.1–1.0)
Monocytes Relative: 6 %
Neutro Abs: 4.4 10*3/uL (ref 1.7–7.7)
Neutrophils Relative %: 53 %
Platelets: 168 10*3/uL (ref 150–400)
RBC: 5.17 MIL/uL (ref 4.22–5.81)
RDW: 12.8 % (ref 11.5–15.5)
WBC: 8.2 10*3/uL (ref 4.0–10.5)
nRBC: 0 % (ref 0.0–0.2)

## 2021-05-30 LAB — COMPREHENSIVE METABOLIC PANEL
ALT: 38 U/L (ref 0–44)
AST: 27 U/L (ref 15–41)
Albumin: 3.6 g/dL (ref 3.5–5.0)
Alkaline Phosphatase: 97 U/L (ref 38–126)
Anion gap: 11 (ref 5–15)
BUN: 18 mg/dL (ref 6–20)
CO2: 24 mmol/L (ref 22–32)
Calcium: 9.2 mg/dL (ref 8.9–10.3)
Chloride: 95 mmol/L — ABNORMAL LOW (ref 98–111)
Creatinine, Ser: 1.6 mg/dL — ABNORMAL HIGH (ref 0.61–1.24)
GFR, Estimated: 50 mL/min — ABNORMAL LOW (ref >60)
Glucose, Bld: 651 mg/dL (ref 70–99)
Potassium: 3.8 mmol/L (ref 3.5–5.1)
Sodium: 130 mmol/L — ABNORMAL LOW (ref 135–145)
Total Bilirubin: 0.4 mg/dL (ref 0.3–1.2)
Total Protein: 6.9 g/dL (ref 6.5–8.1)

## 2021-05-30 LAB — URINALYSIS, ROUTINE W REFLEX MICROSCOPIC
Bacteria, UA: NONE SEEN
Bilirubin Urine: NEGATIVE
Glucose, UA: >=500 mg/dL — AB
Hgb urine dipstick: NEGATIVE
Ketones, ur: NEGATIVE mg/dL
Leukocytes,Ua: NEGATIVE
Nitrite: NEGATIVE
Protein, ur: NEGATIVE mg/dL
Specific Gravity, Urine: 1.025 (ref 1.005–1.030)
pH: 5 (ref 5.0–8.0)

## 2021-05-30 LAB — ETHANOL: Alcohol, Ethyl (B): <10 mg/dL (ref <10)

## 2021-05-30 LAB — ACETAMINOPHEN LEVEL: Acetaminophen (Tylenol), Serum: <10 ug/mL — ABNORMAL LOW (ref 10–30)

## 2021-05-30 MED ORDER — DEXTROSE 50 % IV SOLN: 0.0000 mL | INTRAVENOUS | Status: DC | PRN

## 2021-05-30 MED ORDER — ACETAMINOPHEN 325 MG PO TABS
650.0000 mg | ORAL_TABLET | ORAL | Status: DC | PRN
  Administered 2021-05-31: 650 mg via ORAL
  Filled 2021-05-30: qty 2

## 2021-05-30 MED ORDER — SODIUM CHLORIDE 0.9 % IV BOLUS
1000.0000 mL | Freq: Once | INTRAVENOUS | Status: AC
Start: 2021-05-30 — End: 2021-05-30
  Administered 2021-05-30: 1000 mL via INTRAVENOUS

## 2021-05-30 MED ORDER — INSULIN REGULAR(HUMAN) IN NACL 100-0.9 UT/100ML-% IV SOLN
INTRAVENOUS | Status: DC
  Administered 2021-05-30: 12 [IU]/h via INTRAVENOUS
  Filled 2021-05-30: qty 100

## 2021-05-30 MED ORDER — LACTATED RINGERS IV SOLN
INTRAVENOUS | Status: DC
Start: 2021-05-30 — End: 2021-06-03

## 2021-05-30 MED ORDER — INSULIN ASPART 100 UNIT/ML IJ SOLN
0.0000 [IU] | Freq: Three times a day (TID) | INTRAMUSCULAR | Status: DC
  Administered 2021-05-31: 5 [IU] via SUBCUTANEOUS
  Administered 2021-05-31 (×2): 11 [IU] via SUBCUTANEOUS
  Administered 2021-06-01: 15 [IU] via SUBCUTANEOUS
  Administered 2021-06-01: 5 [IU] via SUBCUTANEOUS
  Administered 2021-06-01 – 2021-06-02 (×2): 8 [IU] via SUBCUTANEOUS
  Administered 2021-06-02: 3 [IU] via SUBCUTANEOUS
  Administered 2021-06-02: 15 [IU] via SUBCUTANEOUS
  Administered 2021-06-03: 8 [IU] via SUBCUTANEOUS
  Filled 2021-05-30 (×10): qty 1

## 2021-05-30 MED ORDER — DEXTROSE IN LACTATED RINGERS 5 % IV SOLN: INTRAVENOUS | Status: DC

## 2021-05-30 NOTE — BH Assessment (Addendum)
Comprehensive Clinical Assessment (CCA) Note ? ?05/31/2021 ?Troy Deutscherimothy Gordon ?[id number] ? ?Discharge Disposition: ?Troy Abtsody Taylor, PA-C, reviewed pt's chart and information and determined pt should receive continuous assessment and be re-assessed in the morning by psychiatry. Pt is to remain at [facility]. This information was relayed to pt's team at 2259. ? ?The patient demonstrates the following risk factors for suicide: Chronic risk factors for suicide include: psychiatric disorder of MDD, Recurrent, Moderate, substance use disorder, and previous suicide attempts , most recently a plan several days ago . Acute risk factors for suicide include: unemployment, social withdrawal/isolation, and loss (financial, interpersonal, professional). Protective factors for this patient include: hope for the future. Considering these factors, the overall suicide risk at this point appears to be high. Patient is not appropriate for outpatient follow up. ? ?Therefore, a 1:1 sitter is recommended for suicide precautions. ? ?Flowsheet Row ED from [facility]/2021 [facility] Ad[facility] (Dis[facility]) [facility]  ?C-SSRS RISK CATEGORY High Risk High Risk Low Risk  ? ?  ?Chief Complaint:  ?Chief Complaint  ?Patient presents with  ? V70.1  ? Hyperglycemia  ? Suicidal  ? ?Visit Diagnosis: MDD, Recurrent, Moderate ? ?CCA Screening, Triage and Referral (STR) ?Troy Gordon is a 56 year old patient who came to the [facility] due to worsening depression. Pt states, "I've been having suicidal thoughts. Since my last time here, I've been having them the last week or so. I lost my home and I lost my job." Pt shares he has been staying with a friend but that there's been drama, which he attempts to avoid.  ? ?Pt endorses current SI and a hx of SI. He states he attempted to kill himself several days ago by o/d on her Percocet but he states his  friend took them away from him. [facility]/12/20[facility] - 03/18/2021 and 10/25/2020 - 10/28/2020. He denies he has a plan to kill himself at this time. Pt denies HI, AVH, NSSIB, access to guns/weapons, and engagement with the legal system.  ? ?Pt shares he used marijuana, cocaine, and EtOH last week. He shares he snorted $20 of cocaine and he drank several 12-ounce beers. Pt shares he typically uses cocaine 1x/week and engages in the use of EtOH 3x/week. ? ?Pt is oriented x5. His recent/remote memory is intact. Pt was cooperative throughout the assessment process. Pt's insight, judgement, and impulse control is impaired at this time. ? ?Patient Reported Information ?How did you hear about us? Self ? ?What Is the Reason for Your Visit/Call Today? Pt states, "I've been having suicidal thoughts. Since my last time here, I've been having them the last week or so. I lost my home and I lost my job." Pt shares he has been staying with a friend but that there's been drama, which he attempts to avoid. Pt endorses current SI and a hx of SI. He states he attempted to kill himself several days ago by o/d on her Percocet but he states his friend took them away from him. [facility]12/20[facility] - 03/18/2021 and 10/25/2020 - 10/28/2020. He denies he has a plan to kill himself at this time. Pt denies HI, AVH, NSSIB, access to guns/weapons, and engagement with the legal system. Pt shares he used marijuana, cocaine, and EtOH last week. He shares he snorted $20 of cocaine and he drank several 12-ounce beers. Pt shares he typically uses cocaine 1x/week and  engages in the use of EtOH 3x/week. ? ?How Long Has This Been Causing You Problems? 1 wk - 1 month ? ?What Do You Feel Would Help You the Most Today? Treatment for Depression or other mood problem; Medication(s) ? ? ?Have You Recently Had Any Thoughts About Hurting Yourself? Yes ? ?Are You Planning to Commit Suicide/Harm Yourself At This time? No ? ? ?Have you  Recently Had Thoughts About Hurting Someone [name]? No ? ?Are You Planning to Harm Someone at This Time? No ? ?Explanation: Pt wants to harm a guy known on the street as "Tae." ? ? ?Have You Used Any Alcohol or Drugs in the Past 24 Hours? No ? ?How Long Ago Did You Use Drugs or Alcohol? No data recorded ?What Did You Use and How Much? Pt reprots that he is drinking one 40 oz beer per day and has been binging on THC because that is the substance he had on hand. ? ? ?Do You Currently Have a Therapist/Psychiatrist? No ? ?Name of Therapist/Psychiatrist: No data recorded ? ?Have You Been Recently Discharged From Any Office Practice or Programs? No ? ?Explanation of Discharge From Practice/Program: No data recorded ? ?  ?CCA Screening Triage Referral Assessment ?Type of Contact: Tele-Assessment ? ?Telemedicine Service Delivery: Telemedicine service delivery: This service was provided via telemedicine using a 2-way, interactive audio and video technology ? ?Is this Initial or Reassessment? Initial Assessment ? ?Date Telepsych consult ordered in [facility]:  05/30/21 ? ?Time Telepsych consult ordered in [facility]:  2120 ? ?Location of Assessment: [facility] ? ?Provider Location: [facility] ? ? ?Collateral Involvement: None currently ? ? ?Does Patient Have a Automotive engineer Guardian? No data recorded ?Name and Contact of Legal Guardian: No data recorded ?If Minor and Not Living with Parent(s), Who has Custody? N/A ? ?Is CPS involved or ever been involved? Never ? ?Is APS involved or ever been involved? Never ? ? ?Patient Determined To Be At Risk for Harm To Self or Others Based on Review of Patient Reported Information or Presenting Complaint? No ? ?Method: No Plan ? ?Availability of Means: No access or NA ? ?Intent: Vague intent or NA ? ?Notification Required: Identifiable person is aware ? ?Additional Information for Danger to Others Potential: Previous attempts ? ?Additional Comments for Danger to Others Potential: Has  one person he wants to harm, that is his drug dealer. ? ?Are There Guns or Other Weapons in Your Home? No ? ?Types of Guns/Weapons: No data recorded ?Are These Weapons Safely Secured?                            No data recorded ?Who Could Verify You Are Able To Have These Secured: No data recorded ?Do You Have any Outstanding Charges, Pending Court Dates, Parole/Probation? None ? ?Contacted To Inform of Risk of Harm To Self or Others: Unable to Contact: ? ? ? ?Does Patient Present under Involuntary Commitment? No ? ?IVC Papers Initial File Date: No data recorded ? ?[place] of Residence: [place] ? ? ?Patient Currently Receiving the Following Services: Not Receiving Services ? ? ?Determination of Need: Urgent (48 hours) ? ? ?Options For Referral: Medication Management; Intensive Outpatient Therapy; Other: Comment (Continuous Assessment at [facility]) ? ? ? ? ?CCA Biopsychosocial ?Patient Reported Schizophrenia/Schizoaffective Diagnosis in Past: No ? ? ?Strengths: Pt has friends for supports. He has a desire to improve his mental health. ? ? ?Mental Health Symptoms ?Depression:   ?  Fatigue; Difficulty Concentrating; Hopelessness; Sleep (too much or little); Irritability; Change in energy/activity ?  ?Duration of Depressive symptoms:  ?Duration of Depressive Symptoms: Greater than two weeks ?  ?Mania:   ?None ?  ?Anxiety:    ?Tension; Worrying; Sleep ?  ?Psychosis:   ?None ?  ?Duration of Psychotic symptoms:  ?Duration of Psychotic Symptoms: N/A ?  ?Trauma:   ?None ?  ?Obsessions:   ?None ?  ?Compulsions:   ?None ?  ?Inattention:   ?None ?  ?Hyperactivity/Impulsivity:   ?None ?  ?Oppositional/Defiant Behaviors:   ?None ?  ?Emotional Irregularity:   ?Mood lability; Intense/inappropriate anger; Potentially harmful impulsivity; Recurrent suicidal behaviors/gestures/threats ?  ?Other Mood/Personality Symptoms:   ?None noted ?  ? ?Mental Status Exam ?Appearance and self-care  ?Stature:   ?Average ?  ?Weight:   ?Average weight ?   ?Clothing:   ?-- (Pt is dressed out in scrubs) ?  ?Grooming:   ?Normal ?  ?Cosmetic use:   ?None ?  ?Posture/gait:   ?Normal ?  ?Motor activity:   ?Not Remarkable ?  ?Sensorium  ?Attention:   ?Normal

## 2021-05-30 NOTE — ED Triage Notes (Signed)
Suicidal thoughts for the past week ?

## 2021-05-30 NOTE — ED Notes (Signed)
Pt given frozen meal. 

## 2021-05-30 NOTE — ED Provider Notes (Signed)
Patient's care assumed at 7 PM from [facility] [place].  Basic metabolic panel is pending.  MP returned and shows a glucose of 179 this is decreased from 651 patient's potassium is 3.3 this has decreased from 3.8.  I will give patient 40 mEq of potassium here.  Patient reevaluated he states he is feeling much better.  He is able to eat and drink.  Patient is medically clear at this time for TTS consult. ?  ?Elson Areas, PA-C ?05/30/21 2134 ? ?  ?Derwood Kaplan, MD ?06/01/21 2042 ? ?

## 2021-05-30 NOTE — ED Notes (Signed)
Per PA, continue IVF until repeat BMET complete then d/c fluids.  ?

## 2021-05-30 NOTE — ED Notes (Signed)
Date and time results received: 05/30/21 1500 ? ? ?Test: glucose ?Critical Value: 651 ? ?Name of Provider Notified: EDP ? ?Orders Received? Or Actions Taken?: notified ?

## 2021-05-30 NOTE — ED Provider Notes (Signed)
?[facility] EMERGENCY DEPARTMENT ?Provider Note ? ? ?CSN: [id number] ?Arrival date & time: 05/30/21  1310 ? ?  ? ?History ? ?Chief Complaint  ?Patient presents with  ? V70.1  ? Hyperglycemia  ? ? ?Troy Gordon is a 56 y.o. male. ? ? ?Hyperglycemia ?Associated symptoms: no abdominal pain, no confusion, no dizziness, no fever, no nausea, no vomiting and no weakness   ? ?  ? ?Troy Gordon is a 56 y.o. male with past medical history of hypertension, type 2 diabetes, cocaine use, and major depressive disorder.  He presents to the Emergency Department complaining of suicidal thoughts x1 week.  He states he is currently homeless and living at a local hotel.  He states that he has been fairly depressed over his current situation and has been having thoughts of harming himself.  He states that he has a plan to "take a bunch of Percocet."  He also states that he has not taking his insulin x 4 days also not taking his antidepressants, and antihypertensive medication.  He denies any auditory or visual hallucinations and homicidal thoughts.  Also denies any vomiting, dizziness or syncope. ? ? ?Home Medications ?Prior to Admission medications   ?Medication Sig Start Date End Date Taking? Authorizing Provider  ?atorvastatin (LIPITOR) 80 MG tablet Take 1 tablet (80 mg total) by mouth daily. For high cholesterol 04/26/21  Yes [name], PA-C  ?hydrochlorothiazide (HYDRODIURIL) 25 MG tablet Take 1 tablet (25 mg total) by mouth daily. 04/26/21  Yes [name], PA-C  ?insulin glargine (LANTUS) 100 UNIT/ML injection Inject 0.3 mLs (30 Units total) into the skin daily. For diabetes management ?Patient taking differently: Inject 40 Units into the skin daily. For diabetes management 10/29/20  Yes Sanjuana Kava, NP  ?lisinopril (ZESTRIL) 20 MG tablet Take 1 tablet (20 mg total) by mouth daily. 04/26/21  Yes [name], PA-C  ?amLODipine (NORVASC) 5 MG tablet Take 1 tablet (5 mg total) by mouth daily. ?Patient not taking:  Reported on 05/30/2021 04/26/21   [name], PA-C  ?buPROPion (WELLBUTRIN XL) 150 MG 24 hr tablet Take 1 tablet (150 mg total) by mouth daily. For depression ?Patient not taking: Reported on 05/30/2021 10/28/20   Armandina Stammer I, NP  ?cloNIDine (CATAPRES) 0.1 MG tablet Take 1 tablet (0.1 mg total) by mouth 2 (two) times daily. ?Patient not taking: Reported on 05/30/2021 03/18/21   Carlyn Reichert, MD  ?sertraline (ZOLOFT) 50 MG tablet Take 1 tablet (50 mg total) by mouth daily. ?Patient not taking: Reported on 05/30/2021 03/19/21   Carlyn Reichert, MD  ?sildenafil (VIAGRA) 100 MG tablet Take 0.5-1 tablets (50-100 mg total) by mouth daily as needed for erectile dysfunction. ?Patient not taking: Reported on 05/30/2021 04/26/21   [name], PA-C  ?   ? ?Allergies    ?Patient has no known allergies.   ? ?Review of Systems   ?Review of Systems  ?Constitutional:  Negative for appetite change, chills and fever.  ?Gastrointestinal:  Negative for abdominal pain, nausea and vomiting.  ?Genitourinary:  Negative for difficulty urinating.  ?Musculoskeletal:  Negative for arthralgias and myalgias.  ?Neurological:  Negative for dizziness, syncope, weakness and headaches.  ?Psychiatric/Behavioral:  Positive for suicidal ideas. Negative for confusion and hallucinations.   ?All other systems reviewed and are negative. ? ?Physical Exam ?Updated Vital Signs ?BP (!) 141/96   Pulse 77   Temp 97.9 ?F (36.6 ?C) (Oral)   Resp 18   Ht 5\' 8"  (1.727 m)   Wt 84.4 kg  SpO2 98%   BMI 28.28 kg/m?  ?Physical Exam ?Vitals and nursing note reviewed.  ?Constitutional:   ?   General: He is not in acute distress. ?   Appearance: Normal appearance. He is not ill-appearing.  ?HENT:  ?   Mouth/Throat:  ?   Mouth: Mucous membranes are moist.  ?Cardiovascular:  ?   Rate and Rhythm: Normal rate and regular rhythm.  ?   Pulses: Normal pulses.  ?Pulmonary:  ?   Effort: Pulmonary effort is normal. No respiratory distress.  ?Abdominal:  ?   Palpations:  Abdomen is soft.  ?   Tenderness: There is no abdominal tenderness.  ?Musculoskeletal:     ?   General: Normal range of motion.  ?   Right lower leg: No edema.  ?   Left lower leg: No edema.  ?Skin: ?   General: Skin is warm.  ?   Capillary Refill: Capillary refill takes less than 2 seconds.  ?Neurological:  ?   General: No focal deficit present.  ?   Mental Status: He is alert.  ?   Sensory: No sensory deficit.  ?   Motor: No weakness.  ?Psychiatric:     ?   Attention and Perception: Attention normal.     ?   Mood and Affect: Mood is depressed.     ?   Speech: Speech normal.     ?   Behavior: Behavior normal. Behavior is cooperative.     ?   Thought Content: Thought content includes suicidal ideation. Thought content includes suicidal plan.  ? ? ?ED Results / Procedures / Treatments   ?Labs ?(all labs ordered are listed, but only abnormal results are displayed) ?Labs Reviewed  ?SALICYLATE LEVEL - Abnormal; Notable for the following components:  ?    Result Value  ? Salicylate Lvl <7.0 (*)   ? All other components within normal limits  ?ACETAMINOPHEN LEVEL - Abnormal; Notable for the following components:  ? Acetaminophen (Tylenol), Serum <10 (*)   ? All other components within normal limits  ?COMPREHENSIVE METABOLIC PANEL - Abnormal; Notable for the following components:  ? Sodium 130 (*)   ? Chloride 95 (*)   ? Glucose, Bld 651 (*)   ? Creatinine, Ser 1.60 (*)   ? GFR, Estimated 50 (*)   ? All other components within normal limits  ?CBG MONITORING, ED - Abnormal; Notable for the following components:  ? Glucose-Capillary >600 (*)   ? All other components within normal limits  ?CBG MONITORING, ED - Abnormal; Notable for the following components:  ? Glucose-Capillary 479 (*)   ? All other components within normal limits  ?CBG MONITORING, ED - Abnormal; Notable for the following components:  ? Glucose-Capillary 385 (*)   ? All other components within normal limits  ?CBG MONITORING, ED - Abnormal; Notable for the  following components:  ? Glucose-Capillary 207 (*)   ? All other components within normal limits  ?CBG MONITORING, ED - Abnormal; Notable for the following components:  ? Glucose-Capillary 165 (*)   ? All other components within normal limits  ?CBC WITH DIFFERENTIAL/PLATELET  ?ETHANOL  ?URINALYSIS, ROUTINE W REFLEX MICROSCOPIC  ?RAPID URINE DRUG SCREEN, HOSP PERFORMED  ?CBC  ?BASIC METABOLIC PANEL  ? ? ?EKG ?EKG Interpretation ? ?Date/Time:  Tuesday May 30 2021 14:00:18 EDT ?Ventricular Rate:  73 ?PR Interval:  144 ?QRS Duration: 91 ?QT Interval:  377 ?QTC Calculation: 416 ?R Axis:   61 ?Text Interpretation: Sinus rhythm Abnormal T, consider  ischemia, diffuse leads no significant change since Jan 2023 Confirmed by Pricilla Loveless [phone]) on 05/30/2021 2:17:46 PM ? ?Radiology ?No results found. ? ?Procedures ?Procedures  ? ? ?Medications Ordered in ED ?Medications  ?lactated ringers infusion ( Intravenous Not Given 05/30/21 1754)  ?dextrose 5 % in lactated ringers infusion ( Intravenous New Bag/Given 05/30/21 1737)  ?dextrose 50 % solution 0-50 mL (has no administration in time range)  ?sodium chloride 0.9 % bolus 1,000 mL (0 mLs Intravenous Stopped 05/30/21 1738)  ? ? ?ED Course/ Medical Decision Making/ A&P ?  ?                        ?Medical Decision Making ?Patient here for evaluation of suicidal thoughts x1 week.  Attributes his thoughts to homelessness and having to stay at a local hotel.  Has not been taking his medications, especially his insulin x4 days.  Denies any auditory or visual hallucinations or homicidal thoughts. ?Patient tells me he has a specific plan to "take bunch of Percocet." ? ?On exam, patient has depressed mood, but otherwise pleasant and cooperative.  Initial CBG greater than 600.  He will need medical evaluation for his hyperglycemia. ? ?Amount and/or Complexity of Data Reviewed ?External Data Reviewed: notes. ?   Details: Prior medical notes reviewed by me ?Labs: ordered. ?   Details:  Labs today showed no evidence of leukocytosis, chemistry show blood sugar of 651 his anion gap is 11, and his bicarb is 24.  Sodium 130.  Serum creatinine elevated but near baseline.  Tylenol and salicylate levels a

## 2021-05-31 DIAGNOSIS — F1420 Cocaine dependence, uncomplicated: Secondary | ICD-10-CM

## 2021-05-31 DIAGNOSIS — F332 Major depressive disorder, recurrent severe without psychotic features: Secondary | ICD-10-CM

## 2021-05-31 DIAGNOSIS — R45851 Suicidal ideations: Secondary | ICD-10-CM

## 2021-05-31 LAB — CBG MONITORING, ED
Glucose-Capillary: 257 mg/dL — ABNORMAL HIGH (ref 70–99)
Glucose-Capillary: 216 mg/dL — ABNORMAL HIGH (ref 70–99)
Glucose-Capillary: 314 mg/dL — ABNORMAL HIGH (ref 70–99)
Glucose-Capillary: 314 mg/dL — ABNORMAL HIGH (ref 70–99)
Glucose-Capillary: 303 mg/dL — ABNORMAL HIGH (ref 70–99)

## 2021-05-31 LAB — HEMOGLOBIN A1C
Hgb A1c MFr Bld: 12.5 % — ABNORMAL HIGH (ref 4.8–5.6)
Mean Plasma Glucose: 312.05 mg/dL

## 2021-05-31 LAB — RESP PANEL BY RT-PCR (FLU A&B, COVID) ARPGX2
Influenza A by PCR: NEGATIVE
Influenza B by PCR: NEGATIVE
SARS Coronavirus 2 by RT PCR: NEGATIVE

## 2021-05-31 MED ORDER — INSULIN GLARGINE-YFGN 100 UNIT/ML ~~LOC~~ SOLN
40.0000 [IU] | Freq: Every day | SUBCUTANEOUS | Status: DC
  Administered 2021-05-31 – 2021-06-02 (×3): 40 [IU] via SUBCUTANEOUS
  Filled 2021-05-31 (×5): qty 0.4

## 2021-05-31 MED ORDER — ATORVASTATIN CALCIUM 40 MG PO TABS
80.0000 mg | ORAL_TABLET | Freq: Every day | ORAL | Status: DC
  Administered 2021-05-31 – 2021-06-02 (×3): 80 mg via ORAL
  Filled 2021-05-31 (×3): qty 2

## 2021-05-31 MED ORDER — SERTRALINE HCL 50 MG PO TABS
50.0000 mg | ORAL_TABLET | Freq: Every day | ORAL | Status: DC
  Administered 2021-05-31 – 2021-06-02 (×3): 50 mg via ORAL
  Filled 2021-05-31 (×3): qty 1

## 2021-05-31 MED ORDER — INSULIN GLARGINE-YFGN 100 UNIT/ML ~~LOC~~ SOLN
40.0000 [IU] | Freq: Every day | SUBCUTANEOUS | Status: DC
  Filled 2021-05-31 (×2): qty 0.4

## 2021-05-31 MED ORDER — BUPROPION HCL ER (XL) 150 MG PO TB24
150.0000 mg | ORAL_TABLET | Freq: Every day | ORAL | Status: DC
  Administered 2021-05-31 – 2021-06-02 (×3): 150 mg via ORAL
  Filled 2021-05-31 (×3): qty 1

## 2021-05-31 MED ORDER — LISINOPRIL 10 MG PO TABS
20.0000 mg | ORAL_TABLET | Freq: Every day | ORAL | Status: DC
  Administered 2021-05-31 – 2021-06-02 (×3): 20 mg via ORAL
  Filled 2021-05-31 (×3): qty 2

## 2021-05-31 MED ORDER — HYDROCHLOROTHIAZIDE 25 MG PO TABS
25.0000 mg | ORAL_TABLET | Freq: Every day | ORAL | Status: DC
  Administered 2021-05-31 – 2021-06-02 (×3): 25 mg via ORAL
  Filled 2021-05-31 (×3): qty 1

## 2021-05-31 NOTE — Progress Notes (Signed)
Patient has been faxed out per the request of Dr. Lucianne Muss. Patient meets [facility] inpatient criteria per Ophelia Shoulder, NP. Patient has been faxed out to the following facilities:  ? ?[facility]  [address] [place] [place] [address] [phone]  ?[facility]  [address] [place] [address] [phone]  ?[facility]  [address], [place] [address] [phone]  ?[facility]  [address], [place] [address] [phone]  ?[facility]  [address] [place], [place] [address] [phone]  ?[facility]  [address] [place], [place] [address] [phone]  ?[facility]  [address] [place] [address] [phone]  ?[facility]  [address] [place] [address] [phone]  ?[facility]  [address] [place] [address] [phone]  ? ?Damita Dunnings, MSW, LCSW-A  ?1:01 PM 05/31/2021   ?

## 2021-05-31 NOTE — ED Notes (Addendum)
Pt being dressed out at this time. Belongings locked up in locker. Pt wanded by security ?

## 2021-05-31 NOTE — ED Notes (Signed)
Visitor at the bedside. Visitor brought large grey bag, bag labeled and placed in ED storage and Bible given to patient from his personal belongings.  ?

## 2021-05-31 NOTE — Progress Notes (Signed)
Inpatient Diabetes Program Recommendations ? ?AACE/ADA: New Consensus Statement on Inpatient Glycemic Control (2015) ? ?Target Ranges:  Prepandial:   less than 140 mg/dL ?     Peak postprandial:   less than 180 mg/dL (1-2 hours) ?     Critically ill patients:  140 - 180 mg/dL  ? ?Lab Results  ?Component Value Date  ? GLUCAP 216 (H) 05/31/2021  ? HGBA1C 12.5 (H) 05/30/2021  ? ? ?Review of Glycemic Control ? Latest Reference Range & Units 03/13/21 06:25 05/30/21 14:05  ?Hemoglobin A1C 4.8 - 5.6 % 10.7 (H) 12.5 (H)  ?(H): Data is abnormally high ? ?Diabetes history: DM2 ?Outpatient Diabetes medications: Lantus 40 units qd ?Current orders for Inpatient glycemic control: Novolog 0-15 units tid correction ? ?Inpatient Diabetes Program Recommendations:   ?-Add Semglee 30 units qd ?-Change diet to carb modified if patient appropriate ? ?Thank you, ?Billy Fischer Pearl Bents, RN, MSN, CDE  ?Diabetes Coordinator ?Inpatient Glycemic Control Team ?Team Pager [phone] (8am-5pm) ?05/31/2021 10:19 AM ? ? ? ? ?

## 2021-05-31 NOTE — Consult Note (Signed)
Telepsych Consultation  ? ?Reason for Consult:  Psychiatric Reassessment for suicidal ideations ?Referring Physician:  Elson AreasSofia, Leslie K, PA-C ?[facility]   [facility]  ?Location of Provider: Other: virtual home office ? ?Patient Identification: Troy Gordon ?MRN:  [id number] ?Principal Diagnosis: MDD (major depressive disorder) ?Diagnosis:  Principal Problem: ?  MDD (major depressive disorder) ?Active Problems: ?  Cocaine use disorder, severe, dependence (HCC) ? ? ?Total Time spent with patient: 30 minutes ? ?Subjective:   ?Troy Gordon is a 56 y.o. male patient admitted with suicidal ideations and undisclosed plan.  Patient endorses a few days prior to admission he attempted to overdose on opioids but was interrupted by a friend.  ? ?HPI:   ?Patient seen via telepsych by this provider; chart reviewed and consulted with Dr. Lucianne MussKumar on 05/31/21.  On evaluation Troy Gordon continues to endorse depressive symptoms, despair and suicidal ideations.  He reports having a plan but is guarded and states he does not want to share it.  A few days prior to admission, he had plans to overdose on percocet but states this was interrupted by a friend.  He denies protective factors and cannot contract for safety.   ?  ? ?Past Psychiatric History: MDD, polysubstance abuse ? ?Risk to Self:  yes ?Risk to Others:  no ?Prior Inpatient Therapy: yes  ?Prior Outpatient Therapy:  yes ? ?Past Medical History:  ?Past Medical History:  ?Diagnosis Date  ? Depression   ? Diabetes mellitus (HCC)   ? Dyslipidemia   ? Hyperlipidemia   ? Hypertension   ?  ?Past Surgical History:  ?Procedure Laterality Date  ? CIRCUMCISION    ? as an adult  ? ?Family History:  ?Family History  ?Problem Relation Age of Onset  ? Diabetes Maternal Grandmother   ? Hypertension Maternal Grandmother   ? Hypertension Father   ? ?Family Psychiatric  History: unknown ?Social History:  ?Social History  ? ?Substance and Sexual Activity  ?Alcohol Use Not Currently   ? Comment: occasionally beer  ?   ?Social History  ? ?Substance and Sexual Activity  ?Drug Use Yes  ? Types: Marijuana, Cocaine  ? Comment: last used crack 06/07/20  ?  ?Social History  ? ?Socioeconomic History  ? Marital status: Single  ?  Spouse name: Not on file  ? Number of children: Not on file  ? Years of education: Not on file  ? Highest education level: Not on file  ?Occupational History  ? Not on file  ?Tobacco Use  ? Smoking status: Former  ?  Years: 1.00  ?  Types: Cigarettes  ? Smokeless tobacco: Never  ? Tobacco comments:  ?  1 cig every 5 weeks  ?Vaping Use  ? Vaping Use: Never used  ?Substance and Sexual Activity  ? Alcohol use: Not Currently  ?  Comment: occasionally beer  ? Drug use: Yes  ?  Types: Marijuana, Cocaine  ?  Comment: last used crack 06/07/20  ? Sexual activity: Not on file  ?Other Topics Concern  ? Not on file  ?Social History Narrative  ? Not on file  ? ?Social Determinants of Health  ? ?Financial Resource Strain: Not on file  ?Food Insecurity: Not on file  ?Transportation Needs: Not on file  ?Physical Activity: Not on file  ?Stress: Not on file  ?Social Connections: Not on file  ? ?Additional Social History: ?  ? ?Allergies:  No Known Allergies ? ?Labs:  ?Results for orders placed or performed during  the hospital encounter of 05/30/21 (from the past 48 hour(s))  ?CBG monitoring, ED     Status: Abnormal  ? Collection Time: 05/30/21  1:31 PM  ?Result Value Ref Range  ? Glucose-Capillary >600 (HH) 70 - 99 mg/dL  ?  Comment: Glucose reference range applies only to samples taken after fasting for at least 8 hours.  ?CBC with Differential     Status: None  ? Collection Time: 05/30/21  2:05 PM  ?Result Value Ref Range  ? WBC 8.2 4.0 - 10.5 K/uL  ? RBC 5.17 4.22 - 5.81 MIL/uL  ? Hemoglobin 14.3 13.0 - 17.0 g/dL  ? HCT 41.5 39.0 - 52.0 %  ? MCV 80.3 80.0 - 100.0 fL  ? MCH 27.7 26.0 - 34.0 pg  ? MCHC 34.5 30.0 - 36.0 g/dL  ? RDW 12.8 11.5 - 15.5 %  ? Platelets 168 150 - 400 K/uL  ? nRBC 0.0 0.0  - 0.2 %  ? Neutrophils Relative % 53 %  ? Neutro Abs 4.4 1.7 - 7.7 K/uL  ? Lymphocytes Relative 38 %  ? Lymphs Abs 3.1 0.7 - 4.0 K/uL  ? Monocytes Relative 6 %  ? Monocytes Absolute 0.5 0.1 - 1.0 K/uL  ? Eosinophils Relative 1 %  ? Eosinophils Absolute 0.1 0.0 - 0.5 K/uL  ? Basophils Relative 1 %  ? Basophils Absolute 0.0 0.0 - 0.1 K/uL  ? Immature Granulocytes 1 %  ? Abs Immature Granulocytes 0.05 0.00 - 0.07 K/uL  ?  Comment: Performed at [facility], [address] [place], [place] [address]  ?Comprehensive metabolic panel     Status: Abnormal  ? Collection Time: 05/30/21  2:05 PM  ?Result Value Ref Range  ? Sodium 130 (L) 135 - 145 mmol/L  ? Potassium 3.8 3.5 - 5.1 mmol/L  ? Chloride 95 (L) 98 - 111 mmol/L  ? CO2 24 22 - 32 mmol/L  ? Glucose, Bld 651 (HH) 70 - 99 mg/dL  ?  Comment: Glucose reference range applies only to samples taken after fasting for at least 8 hours. ?CRITICAL RESULT CALLED TO, READ BACK BY AND VERIFIED WITH: ?LONG,L ON 05/30/21 AT 1455 BY LOY,C ?  ? BUN 18 6 - 20 mg/dL  ? Creatinine, Ser 1.60 (H) 0.61 - 1.24 mg/dL  ? Calcium 9.2 8.9 - 10.3 mg/dL  ? Total Protein 6.9 6.5 - 8.1 g/dL  ? Albumin 3.6 3.5 - 5.0 g/dL  ? AST 27 15 - 41 U/L  ? ALT 38 0 - 44 U/L  ? Alkaline Phosphatase 97 38 - 126 U/L  ? Total Bilirubin 0.4 0.3 - 1.2 mg/dL  ? GFR, Estimated 50 (L) >60 mL/min  ?  Comment: (NOTE) ?Calculated using the CKD-EPI Creatinine Equation (2021) ?  ? Anion gap 11 5 - 15  ?  Comment: Performed at [facility], [address], [place], [place] [address]  ?Hemoglobin A1c     Status: Abnormal  ? Collection Time: 05/30/21  2:05 PM  ?Result Value Ref Range  ? Hgb A1c MFr Bld 12.5 (H) 4.8 - 5.6 %  ?  Comment: (NOTE) ?Pre diabetes:          5.7%-6.4% ? ?Diabetes:              >6.4% ? ?Glycemic control for   <7.0% ?adults with diabetes ?  ? Mean Plasma Glucose 312.05 mg/dL  ?  Comment: Performed at [facility], [address], [place], [place] [address]  ?  Salicylate level     Status: Abnormal  ?  Collection Time: 05/30/21  2:06 PM  ?Result Value Ref Range  ? Salicylate Lvl <7.0 (L) 7.0 - 30.0 mg/dL  ?  Comment: Performed at [facility], [address], [place], [place] [address]  ?Acetaminophen level     Status: Abnormal  ? Collection Time: 05/30/21  2:06 PM  ?Result Value Ref Range  ? Acetaminophen (Tylenol), Serum <10 (L) 10 - 30 ug/mL  ?  Comment: (NOTE) ?Therapeutic concentrations vary significantly. A range of 10-30 ug/mL  ?may be an effective concentration for many patients. However, some  ?are best treated at concentrations outside of this range. ?Acetaminophen concentrations >150 ug/mL at 4 hours after ingestion  ?and >50 ug/mL at 12 hours after ingestion are often associated with  ?toxic reactions. ? ?Performed at [facility], [address] [place], [place] [address] ?  ?Ethanol     Status: None  ? Collection Time: 05/30/21  2:06 PM  ?Result Value Ref Range  ? Alcohol, Ethyl (B) <10 <10 mg/dL  ?  Comment: (NOTE) ?Lowest detectable limit for serum alcohol is 10 mg/dL. ? ?For medical purposes only. ?Performed at [facility], [address], [place], [place] [address] ?  ?CBG monitoring, ED     Status: Abnormal  ? Collection Time: 05/30/21  3:56 PM  ?Result Value Ref Range  ? Glucose-Capillary 479 (H) 70 - 99 mg/dL  ?  Comment: Glucose reference range applies only to samples taken after fasting for at least 8 hours.  ?POC CBG, ED     Status: Abnormal  ? Collection Time: 05/30/21  4:34 PM  ?Result Value Ref Range  ? Glucose-Capillary 385 (H) 70 - 99 mg/dL  ?  Comment: Glucose reference range applies only to samples taken after fasting for at least 8 hours.  ?POC CBG, ED     Status: Abnormal  ? Collection Time: 05/30/21  5:31 PM  ?Result Value Ref Range  ? Glucose-Capillary 207 (H) 70 - 99 mg/dL  ?  Comment: Glucose reference range applies only to samples taken after fasting for at least 8 hours.  ?POC CBG, ED     Status: Abnormal  ? Collection Time: 05/30/21  6:40 PM  ?Result Value Ref Range  ?  Glucose-Capillary 165 (H) 70 - 99 mg/dL  ?  Comment: Glucose reference range applies only to samples taken after fasting for at least 8 hours.  ?Basic metabolic panel     Status: Abnormal  ? Collection Time: 0

## 2021-05-31 NOTE — Consult Note (Signed)
Sent secure message to nurse requesting to see patient for psychiatry reassessment via TTS machine.  Awaiting Response.  ?

## 2021-05-31 NOTE — ED Notes (Addendum)
[name] about this patient and states they would accept this patient if his CBG stays under 250 for 12 hours. [phone]. This patient has a bed [name] and needs blood pressure less than 160/100 for 12 hours and Cbg less than 250. Bed is being held for patient and another bed is found, please call to cancel [name] ?

## 2021-06-01 LAB — CBG MONITORING, ED
Glucose-Capillary: 360 mg/dL — ABNORMAL HIGH (ref 70–99)
Glucose-Capillary: 239 mg/dL — ABNORMAL HIGH (ref 70–99)
Glucose-Capillary: 273 mg/dL — ABNORMAL HIGH (ref 70–99)
Glucose-Capillary: 287 mg/dL — ABNORMAL HIGH (ref 70–99)
Glucose-Capillary: 248 mg/dL — ABNORMAL HIGH (ref 70–99)

## 2021-06-01 MED ORDER — CLONIDINE HCL 0.1 MG PO TABS
0.1000 mg | ORAL_TABLET | Freq: Two times a day (BID) | ORAL | Status: DC
  Administered 2021-06-01 – 2021-06-02 (×4): 0.1 mg via ORAL
  Filled 2021-06-01 (×4): qty 1

## 2021-06-01 MED ORDER — AMLODIPINE BESYLATE 5 MG PO TABS
5.0000 mg | ORAL_TABLET | Freq: Every day | ORAL | Status: DC
  Administered 2021-06-01 – 2021-06-02 (×2): 5 mg via ORAL
  Filled 2021-06-01 (×2): qty 1

## 2021-06-01 NOTE — ED Notes (Signed)
Pt has a visitor in room. Pt explained his sugar does get uncontrolled when he doesn't have [place] lasting insulin and a sliding scale for a couple of days for it to get under control. Pt stated the same goes for his bp because he hasn't had prescriptions to be compliant with medications. Nurse explained she will monitor his bp and glucose extra today to see how his body is reacting to meds.  ?

## 2021-06-01 NOTE — ED Notes (Signed)
Troy Gordon called from [name] requesting this morning glucose and BP. Nurse called Troy Gordon back with results 360 and 172/104. Troy Gordon stated she has to give information to Dr and decide if they can hold the bed or not. Policy is glucose has to stay under 250 for 12 hrs and stable bp.  ?

## 2021-06-01 NOTE — ED Notes (Signed)
Pt taking a shower 

## 2021-06-01 NOTE — Progress Notes (Signed)
CSW spoke directly with [name] regarding [facility] referral. Per Jeanett Schlein, Intake RN, the facility continues to be interested in this patient but would like for his blood sugar to be lower. CSW has been advised to notify the facility once the blood sugar is 250 or lower.  ? ? ?Mariea Clonts, MSW, LCSW-A  ?3:26 PM 06/01/2021   ?

## 2021-06-01 NOTE — ED Notes (Addendum)
One time order received for BP.  ?

## 2021-06-01 NOTE — ED Provider Notes (Signed)
Emergency Medicine Observation Re-evaluation Note ? ?Troy Gordon is a 56 y.o. male, seen on rounds today.  Pt initially presented to the ED for complaints of V70.1, Hyperglycemia, and Suicidal ?Currently, the patient is resting, no distress. ? ?Physical Exam  ?BP (!) 172/104 (BP Location: Left Arm)   Pulse 67   Temp 98.2 ?F (36.8 ?C)   Resp 18   Ht 5\' 8"  (1.727 m)   Wt 84.4 kg   SpO2 97%   BMI 28.28 kg/m?  ?Physical Exam ?General: Resting ?Cardiac: Regular rate and rhythm ?Lungs: No increased work of breathing ?Psych: Calm ? ?ED Course / MDM  ?EKG:EKG Interpretation ? ?Date/Time:  Tuesday May 30 2021 14:00:18 EDT ?Ventricular Rate:  73 ?PR Interval:  144 ?QRS Duration: 91 ?QT Interval:  377 ?QTC Calculation: 416 ?R Axis:   61 ?Text Interpretation: Sinus rhythm Abnormal T, consider ischemia, diffuse leads no significant change since Jan 2023 Confirmed by Feb 2023 [phone]) on 05/30/2021 2:17:46 PM ? ?I have reviewed the labs performed to date as well as medications administered while in observation.  Recent changes in the last 24 hours include improved glucose control.  However, the patient has just started his appropriate antihyperglycemic regimen, and glucose values remain about 300. ? ?Plan  ?Current plan is for a behavioral health evaluation, placement. ? Troy Gordon is not under involuntary commitment. ? ? ?  ?[name], MD ?06/01/21 1055 ? ?

## 2021-06-01 NOTE — ED Notes (Signed)
Gave pt sandwich and chips ?

## 2021-06-02 ENCOUNTER — Other Ambulatory Visit: Payer: Self-pay

## 2021-06-02 LAB — CBG MONITORING, ED
Glucose-Capillary: 208 mg/dL — ABNORMAL HIGH (ref 70–99)
Glucose-Capillary: 397 mg/dL — ABNORMAL HIGH (ref 70–99)
Glucose-Capillary: 271 mg/dL — ABNORMAL HIGH (ref 70–99)
Glucose-Capillary: 204 mg/dL — ABNORMAL HIGH (ref 70–99)
Glucose-Capillary: 185 mg/dL — ABNORMAL HIGH (ref 70–99)
Glucose-Capillary: 276 mg/dL — ABNORMAL HIGH (ref 70–99)

## 2021-06-02 NOTE — Progress Notes (Signed)
Patient has been denied by [facility] due to no beds available. Patient meets [facility] inpatient criteria per Ophelia Shoulder, NP. Patient has been faxed out to the following facilities:  ? ?[facility]  [address] [place] [place] [address] [phone]  ?[facility]  [address], [place] [address] [phone]  ?[facility]  [address] [place] [address] [phone]  ?[facility]  [address], [place] [address] [phone]  ?[facility]  [address] [place], [place] [address] [phone]  ?[facility]  [address] [place], [place] [address] [phone]  ?[facility]  [address] [place] [address] [phone]  ?[facility]  [address] [place] [address] [phone]  ?[facility]  [address], [place] [address] [phone]  ? ?Damita Dunnings, MSW, LCSW-A  ?10:47 AM 06/02/2021   ?

## 2021-06-02 NOTE — ED Notes (Signed)
Pt states "I need someone to talk to, I am feeling more suicidal." I sat in the room with pt for approx 10 minutes and pt talked about losing his home and lack of transportation to keep a job. Pt also talked about issues with his ex roommate and advised that he is no longer able to stay with her and is now homeless.  ?

## 2021-06-02 NOTE — ED Notes (Signed)
Visitor Troy Gordon) in room with patient, gave nurse patient's mail, states it is personal mortgage documents. Placed patient label on envelope and placed with personal belongings. ?

## 2021-06-02 NOTE — ED Notes (Signed)
Pt given crackers and gingerale.

## 2021-06-02 NOTE — Progress Notes (Signed)
CSW followed up with [name] regarding male bed availability. Per the Intake RN Jeanett Schlein, the bed has been filled and there are no scheduled discharges until Monday. CSW will fax this Pt out to secure recommended disposition.  ? ?Mariea Clonts, MSW, LCSW-A  ?10:41 AM 06/02/2021   ?

## 2021-06-03 ENCOUNTER — Emergency Department (HOSPITAL_COMMUNITY)
Admission: EM | Admit: 2021-06-03 | Discharge: 2021-06-03 | Disposition: A | Discharge status: Home / Self Care | Payer: Medicaid Other | Attending: Emergency Medicine | Admitting: Emergency Medicine

## 2021-06-03 ENCOUNTER — Emergency Department (HOSPITAL_COMMUNITY): Payer: Medicaid Other

## 2021-06-03 ENCOUNTER — Other Ambulatory Visit: Payer: Self-pay

## 2021-06-03 ENCOUNTER — Encounter (HOSPITAL_COMMUNITY): Payer: Self-pay | Admitting: Emergency Medicine

## 2021-06-03 DIAGNOSIS — Z794 Long term (current) use of insulin: Secondary | ICD-10-CM | POA: Insufficient documentation

## 2021-06-03 DIAGNOSIS — R059 Cough, unspecified: Secondary | ICD-10-CM | POA: Insufficient documentation

## 2021-06-03 DIAGNOSIS — R0602 Shortness of breath: Secondary | ICD-10-CM | POA: Insufficient documentation

## 2021-06-03 DIAGNOSIS — Z79899 Other long term (current) drug therapy: Secondary | ICD-10-CM | POA: Insufficient documentation

## 2021-06-03 DIAGNOSIS — I10 Essential (primary) hypertension: Secondary | ICD-10-CM | POA: Insufficient documentation

## 2021-06-03 DIAGNOSIS — E119 Type 2 diabetes mellitus without complications: Secondary | ICD-10-CM | POA: Insufficient documentation

## 2021-06-03 LAB — CBC WITH DIFFERENTIAL/PLATELET
Abs Immature Granulocytes: 0.1 10*3/uL — ABNORMAL HIGH (ref 0.00–0.07)
Basophils Absolute: 0 10*3/uL (ref 0.0–0.1)
Basophils Relative: 0 %
Eosinophils Absolute: 0.1 10*3/uL (ref 0.0–0.5)
Eosinophils Relative: 1 %
HCT: 42.7 % (ref 39.0–52.0)
Hemoglobin: 14.3 g/dL (ref 13.0–17.0)
Immature Granulocytes: 1 %
Lymphocytes Relative: 38 %
Lymphs Abs: 3.3 10*3/uL (ref 0.7–4.0)
MCH: 27.9 pg (ref 26.0–34.0)
MCHC: 33.5 g/dL (ref 30.0–36.0)
MCV: 83.4 fL (ref 80.0–100.0)
Monocytes Absolute: 0.5 10*3/uL (ref 0.1–1.0)
Monocytes Relative: 6 %
Neutro Abs: 4.6 10*3/uL (ref 1.7–7.7)
Neutrophils Relative %: 54 %
Platelets: 146 10*3/uL — ABNORMAL LOW (ref 150–400)
RBC: 5.12 MIL/uL (ref 4.22–5.81)
RDW: 13.4 % (ref 11.5–15.5)
WBC: 8.6 10*3/uL (ref 4.0–10.5)
nRBC: 0 % (ref 0.0–0.2)

## 2021-06-03 LAB — COMPREHENSIVE METABOLIC PANEL
ALT: 32 U/L (ref 0–44)
AST: 25 U/L (ref 15–41)
Albumin: 3.7 g/dL (ref 3.5–5.0)
Alkaline Phosphatase: 78 U/L (ref 38–126)
Anion gap: 9 (ref 5–15)
BUN: 27 mg/dL — ABNORMAL HIGH (ref 6–20)
CO2: 24 mmol/L (ref 22–32)
Calcium: 9.4 mg/dL (ref 8.9–10.3)
Chloride: 101 mmol/L (ref 98–111)
Creatinine, Ser: 1.39 mg/dL — ABNORMAL HIGH (ref 0.61–1.24)
GFR, Estimated: 59 mL/min — ABNORMAL LOW (ref >60)
Glucose, Bld: 329 mg/dL — ABNORMAL HIGH (ref 70–99)
Potassium: 4.1 mmol/L (ref 3.5–5.1)
Sodium: 134 mmol/L — ABNORMAL LOW (ref 135–145)
Total Bilirubin: 0.4 mg/dL (ref 0.3–1.2)
Total Protein: 7.1 g/dL (ref 6.5–8.1)

## 2021-06-03 LAB — CBG MONITORING, ED
Glucose-Capillary: 253 mg/dL — ABNORMAL HIGH (ref 70–99)
Glucose-Capillary: 355 mg/dL — ABNORMAL HIGH (ref 70–99)

## 2021-06-03 MED ORDER — INSULIN GLARGINE-YFGN 100 UNIT/ML ~~LOC~~ SOLN
10.0000 [IU] | Freq: Once | SUBCUTANEOUS | Status: AC
  Administered 2021-06-03: 10 [IU] via SUBCUTANEOUS
  Filled 2021-06-03: qty 0.1

## 2021-06-03 MED ORDER — POTASSIUM CHLORIDE CRYS ER 20 MEQ PO TBCR
40.0000 meq | EXTENDED_RELEASE_TABLET | Freq: Once | ORAL | Status: AC
  Administered 2021-06-03: 40 meq via ORAL
  Filled 2021-06-03: qty 2

## 2021-06-03 NOTE — ED Notes (Signed)
AC called for insulin dose. ?

## 2021-06-03 NOTE — ED Provider Notes (Signed)
?[facility] ?Provider Note ? ? ?CSN: [id number] ?Arrival date & time: 06/03/21  1651 ? ?  ? ?History ? ?Chief Complaint  ?Patient presents with  ? Shortness of Breath  ? ? ?Troy Gordon is a 56 y.o. male. ? ? ?Shortness of Breath ? ?Patient with medical history notable for anxiety, cocaine use disorder, diabetes, hypertension, hyperlipidemia presents today due to shortness of breath.  Started acutely at 1230, its intermittent.  Associated with a nonproductive cough.  Denies any lower extremity swelling or chest pain.  Patient states he did not have any of the symptoms while at behavioral health.  Of note, he was discharged from behavioral health this morning at around 7 AM. ? ?Home Medications ?Prior to Admission medications   ?Medication Sig Start Date End Date Taking? Authorizing Provider  ?amLODipine (NORVASC) 5 MG tablet Take 1 tablet (5 mg total) by mouth daily. ?Patient not taking: Reported on 05/30/2021 04/26/21   Soyla Dryer, PA-C  ?atorvastatin (LIPITOR) 80 MG tablet Take 1 tablet (80 mg total) by mouth daily. For high cholesterol 04/26/21   Soyla Dryer, PA-C  ?buPROPion (WELLBUTRIN XL) 150 MG 24 hr tablet Take 1 tablet (150 mg total) by mouth daily. For depression ?Patient not taking: Reported on 05/30/2021 10/28/20   Lindell Spar I, NP  ?cloNIDine (CATAPRES) 0.1 MG tablet Take 1 tablet (0.1 mg total) by mouth 2 (two) times daily. ?Patient not taking: Reported on 05/30/2021 03/18/21   Corky Sox, MD  ?hydrochlorothiazide (HYDRODIURIL) 25 MG tablet Take 1 tablet (25 mg total) by mouth daily. 04/26/21   Soyla Dryer, PA-C  ?insulin glargine (LANTUS) 100 UNIT/ML injection Inject 0.3 mLs (30 Units total) into the skin daily. For diabetes management ?Patient taking differently: Inject 40 Units into the skin daily. For diabetes management 10/29/20   Encarnacion Slates, NP  ?lisinopril (ZESTRIL) 20 MG tablet Take 1 tablet (20 mg total) by mouth daily. 04/26/21   Soyla Dryer, PA-C   ?sertraline (ZOLOFT) 50 MG tablet Take 1 tablet (50 mg total) by mouth daily. ?Patient not taking: Reported on 05/30/2021 03/19/21   Corky Sox, MD  ?sildenafil (VIAGRA) 100 MG tablet Take 0.5-1 tablets (50-100 mg total) by mouth daily as needed for erectile dysfunction. ?Patient not taking: Reported on 05/30/2021 04/26/21   Soyla Dryer, PA-C  ?   ? ?Allergies    ?Patient has no known allergies.   ? ?Review of Systems   ?Review of Systems  ?Respiratory:  Positive for shortness of breath.   ? ?Physical Exam ?Updated Vital Signs ?BP (!) 135/91   Pulse 69   Temp 98 ?F (36.7 ?C) (Oral)   Resp 14   Ht 5\' 8"  (1.727 m)   Wt 84.4 kg   SpO2 98%   BMI 28.28 kg/m?  ?Physical Exam ?Vitals and nursing note reviewed. Exam conducted with a chaperone present.  ?Constitutional:   ?   Appearance: Normal appearance.  ?HENT:  ?   Head: Normocephalic and atraumatic.  ?Eyes:  ?   General: No scleral icterus.    ?   Right eye: No discharge.     ?   Left eye: No discharge.  ?   Extraocular Movements: Extraocular movements intact.  ?   Pupils: Pupils are equal, round, and reactive to light.  ?Cardiovascular:  ?   Rate and Rhythm: Normal rate and regular rhythm.  ?   Pulses: Normal pulses.  ?   Heart sounds: Normal heart sounds. No murmur heard. ?  No  friction rub. No gallop.  ?Pulmonary:  ?   Effort: Pulmonary effort is normal. No respiratory distress.  ?   Breath sounds: Normal breath sounds.  ?   Comments: Lungs are clear to auscultation bilaterally ?Abdominal:  ?   General: Abdomen is flat. Bowel sounds are normal. There is no distension.  ?   Palpations: Abdomen is soft.  ?   Tenderness: There is no abdominal tenderness.  ?Skin: ?   General: Skin is warm and dry.  ?   Coloration: Skin is not jaundiced.  ?Neurological:  ?   Mental Status: He is alert. Mental status is at baseline.  ?   Coordination: Coordination normal.  ?Psychiatric:  ?   Comments: Denies HI, SI, hallucinations or delusions  ? ? ?ED Results / Procedures /  Treatments   ?Labs ?(all labs ordered are listed, but only abnormal results are displayed) ?Labs Reviewed  ?COMPREHENSIVE METABOLIC PANEL - Abnormal; Notable for the following components:  ?    Result Value  ? Sodium 134 (*)   ? Glucose, Bld 329 (*)   ? BUN 27 (*)   ? Creatinine, Ser 1.39 (*)   ? GFR, Estimated 59 (*)   ? All other components within normal limits  ?CBC WITH DIFFERENTIAL/PLATELET - Abnormal; Notable for the following components:  ? Platelets 146 (*)   ? Abs Immature Granulocytes 0.10 (*)   ? All other components within normal limits  ?CBG MONITORING, ED - Abnormal; Notable for the following components:  ? Glucose-Capillary 355 (*)   ? All other components within normal limits  ? ? ?EKG ?EKG Interpretation ? ?Date/Time:  Saturday June 03 2021 17:33:44 EDT ?Ventricular Rate:  75 ?PR Interval:  155 ?QRS Duration: 88 ?QT Interval:  377 ?QTC Calculation: 421 ?R Axis:   37 ?Text Interpretation: Sinus rhythm Nonspecific T abnormalities, diffuse leads No significant change since last tracing Confirmed by Isla Pence [phone]) on 06/03/2021 6:07:44 PM ? ?Radiology ?DG Chest 2 View ? ?Result Date: 06/03/2021 ?CLINICAL DATA:  Lightheaded and shortness of breath. EXAM: CHEST - 2 VIEW COMPARISON:  None. FINDINGS: The heart size and mediastinal contours are within normal limits. Both lungs are clear. The visualized skeletal structures are unremarkable. IMPRESSION: No active cardiopulmonary disease. Electronically Signed   By: Virgina Norfolk M.D.   On: 06/03/2021 18:09   ? ?Procedures ?Procedures  ? ? ?Medications Ordered in ED ?Medications  ?insulin glargine-yfgn (SEMGLEE) injection 10 Units (has no administration in time range)  ? ? ?ED Course/ Medical Decision Making/ A&P ?  ?                        ?Medical Decision Making ?Amount and/or Complexity of Data Reviewed ?Labs: ordered. ?Radiology: ordered. ? ? ?This patient presents to the ED for concern of Shortness of breath, this involves an extensive number  of treatment options, and is a complaint that carries with it a high risk of complications and morbidity.  The differential diagnosis includes pneumonia, infection, PE, ACS, pulmonary edema, viral infection, other ? ? ?Additional history obtained:  ? ?Reviewed note from earlier today.  Patient was just discharged by behavioral health this morning at 7 AM.  He suffers from unstable housing and was given resources. ? ?  ?Lab Tests: ? ?I ordered, viewed, and personally interpreted labs.  The pertinent results include:   ?Patient is glycemic at 329, is not in DKA with normal potassium and bicarb.  Creatinine roughly at baseline  compared to prior although it is elevated.  There is no leukocytosis, no anemia.  She ?  ?Imaging Studies ordered: ? ?I directly visualized the chest x-ray, which showed no acute process ? ?I agree with the radiologist interpretation ?  ? ?ECG/Cardiac monitoring:  ? ?Per my interpretation, EKG shows no changes compared to prior ? ?The patient was maintained on a cardiac monitor.  Visualized monitor strip which showed sinus rhythm with a heart rate of 69 per my interpretation.  ? ? ?Medicines ordered and prescription drug management: ? ?I ordered medication including: Lantus ? ?I have reviewed the patients home medicines and have made adjustments as needed ? ? ?Test Considered: ? ?CT considered CTA but given not hypoxic and not having chest pain I think that is less likely.  Considered ACS but he is not having any chest pain so that seems unlikely as well.  Consider hospitalization but given stable vitals I do not feel that is warranted. ? ? Reevaluation: ? ?After the interventions noted above, I reevaluated the patient and found patient well, no labored breathing ? ? ?Problems addressed / ED Course: ?56 year old male presenting due to shortness of breath times this morning.  He is not tachycardic, not hypoxic or tachypneic.  His lungs are clear to auscultation on exam, no lower extremity edema.   Work-up does not show any evidence of pneumonia, I considered but ultimately have low suspicion for any acute emergent pathology.  Patient given dose of his home medicine for insulin given he was hyperglycemic,

## 2021-06-03 NOTE — Discharge Instructions (Addendum)
It was our pleasure to provide your ER care today - we hope that you feel better. ? ?Make sure to avoid cocaine use, as it is very harmful to your physical health and mental well-being. See resource guide provided for social support services, shelters, and substance use treatment programs.  ? ?For mental health issues and/or crisis, you may go directly to the Behavioral Health Urgent [facility]risis Center - it is open 24/7 and walk-ins are welcome.  You may also use [facility] program as a Producer, television/film/video.  ? ?Also follow up closely with primary care doctor in the next 1-2 weeks, including for follow up and management of your diabetes.  Your potassium level is mildly low - eat plenty of fruits and vegetables, and follow up with primary care doctor.  ? ?Return to ER if worse, new symptoms, fevers, chest pain, trouble breathing, or other concern.  ?

## 2021-06-03 NOTE — ED Triage Notes (Signed)
Patient c/o shortness of breath and feeling "light headed". Per patient started today at 97. Per patient only hx of asthma as child. Denies any chest pain or cardiac hx.  ?

## 2021-06-03 NOTE — Discharge Instructions (Signed)
Work-up today looked good, no signs of acute or emergent source of the shortness of breath.  Follow-up with your primary as needed. ?

## 2021-06-03 NOTE — ED Provider Notes (Signed)
Emergency Medicine Observation Re-evaluation Note ? ?Troy Gordon is a 56 y.o. male, seen on rounds today.  Pt initially presented to the ED for complaints of BH evaluation. Pt notes recent stress related to homelessness. This AM reports feeling improved. He is having no trouble sleeping. He has normal appetite/good po intake. Indicates he has friend in [place] area that he can potentially reside with.  ? ?Physical Exam  ?BP 112/75 (BP Location: Right Arm)   Pulse 72   Temp 98.2 ?F (36.8 ?C) (Oral)   Resp 16   Ht 1.727 m (5\' 8" )   Wt 84.4 kg   SpO2 99%   BMI 28.28 kg/m?  ?Physical Exam ?General: alert, content, conversant.  ?Cardiac: regular rate. ?Lungs: breathing comfortably.  ?Psych: patient has normal mood and affect. He does not appear to be acutely depressed or despondent. No SI/HI. No plan or desire to harm self. Pt is not responding to internal stimuli - no delusions, hallucinations, or acute psychosis noted.  ? ?ED Course / MDM  ? ? ?I have reviewed the labs performed to date as well as medications administered while in observation.  Recent changes in the last 24 hours include ED obs, reassessment.  ? ?Plan  ? ? Shayan Bramhall is not under involuntary commitment. ? ?Patient reassessed. Reports feeling improved. Vitals normal, normal appetite, good po intake. Pt with normal mood and affect, and no acute psychosis noted.  ? ?Pt currently appears stable for d/c.  ? ?Rec close outpatient BH and pcp f/u. Will also provide resource guides for social services and substance use treatment.  ? ?Return precautions provided.  ? ? ? ? ? ?  ?[name], MD ?06/03/21 06/05/21 ? ?

## 2021-06-07 ENCOUNTER — Ambulatory Visit: Payer: Medicaid Other | Admitting: Physician Assistant

## 2021-06-07 ENCOUNTER — Encounter: Payer: Self-pay | Admitting: Physician Assistant

## 2021-06-07 ENCOUNTER — Other Ambulatory Visit: Payer: Self-pay

## 2021-06-07 VITALS — BP 146/80 | HR 67 | Temp 98.5°F | Wt 190.0 lb

## 2021-06-07 DIAGNOSIS — I10 Essential (primary) hypertension: Secondary | ICD-10-CM

## 2021-06-07 DIAGNOSIS — E785 Hyperlipidemia, unspecified: Secondary | ICD-10-CM

## 2021-06-07 DIAGNOSIS — N189 Chronic kidney disease, unspecified: Secondary | ICD-10-CM

## 2021-06-07 DIAGNOSIS — F489 Nonpsychotic mental disorder, unspecified: Secondary | ICD-10-CM

## 2021-06-07 DIAGNOSIS — E1165 Type 2 diabetes mellitus with hyperglycemia: Secondary | ICD-10-CM

## 2021-06-07 MED ORDER — INSULIN GLARGINE 100 UNIT/ML ~~LOC~~ SOLN: 50.0000 [IU] | Freq: Every day | SUBCUTANEOUS | 0 refills | Status: DC

## 2021-06-07 MED ORDER — AMLODIPINE BESYLATE 10 MG PO TABS: 10.0000 mg | ORAL_TABLET | Freq: Every day | ORAL | 0 refills | Status: DC

## 2021-06-07 NOTE — Progress Notes (Signed)
? ?BP (!) 146/80   Pulse 67   Temp 98.5 ?F (36.9 ?C)   Wt 190 lb (86.2 kg)   SpO2 95%   BMI 28.89 kg/m?   ? ?Subjective:  ? ? Patient ID: Troy Gordon, male    DOB: [birth year], 56 y.o.   MRN: [id number] ? ?HPI: ?Troy Gordon is a 56 y.o. male presenting on 06/07/2021 for Diabetes, Hyperlipidemia, and Hypertension ? ? ?HPI ? ?Chief Complaint  ?Patient presents with  ? Diabetes  ? Hyperlipidemia  ? Hypertension  ? ? ? ?[facility] since his appointment here a month ago.   ? ?Recent labs: ?A1c 12.5-  05/30/21 ?CMP- 06/03/21-   cr 1.39 ? ? ?He is going to [employer] center today.  He is trying to move away form the area; he says it's bad for him here,   The people he is around.   ? ?He is hoping To get into a shelter.   He is hoping to go to [place] area.   ? ?He no longer works at [employer]. ? ?He expects to not return to this office again.   ? ?He knows his DM is uncontrolled.  His FBs running 220s ? ? ? ? ? ?Relevant past medical, surgical, family and social history reviewed and updated as indicated. Interim medical history since our last visit reviewed. ?Allergies and medications reviewed and updated. ? ? ?Current Outpatient Medications:  ?  amLODipine (NORVASC) 5 MG tablet, Take 1 tablet (5 mg total) by mouth daily., Disp: 90 tablet, Rfl: 0 ?  atorvastatin (LIPITOR) 80 MG tablet, Take 1 tablet (80 mg total) by mouth daily. For high cholesterol, Disp: 90 tablet, Rfl: 0 ?  buPROPion (WELLBUTRIN XL) 150 MG 24 hr tablet, Take 1 tablet (150 mg total) by mouth daily. For depression, Disp: 30 tablet, Rfl: 0 ?  cloNIDine (CATAPRES) 0.1 MG tablet, Take 1 tablet (0.1 mg total) by mouth 2 (two) times daily., Disp: 60 tablet, Rfl: 1 ?  hydrochlorothiazide (HYDRODIURIL) 25 MG tablet, Take 1 tablet (25 mg total) by mouth daily., Disp: 90 tablet, Rfl: 0 ?  insulin glargine (LANTUS) 100 UNIT/ML injection, Inject 0.3 mLs (30 Units total) into the skin daily. For diabetes management (Patient  taking differently: Inject 40 Units into the skin daily. For diabetes management), Disp: 10 mL, Rfl: 0 ?  lisinopril (ZESTRIL) 20 MG tablet, Take 1 tablet (20 mg total) by mouth daily., Disp: 90 tablet, Rfl: 0 ?  sertraline (ZOLOFT) 50 MG tablet, Take 1 tablet (50 mg total) by mouth daily., Disp: 30 tablet, Rfl: 1 ?  sildenafil (VIAGRA) 100 MG tablet, Take 0.5-1 tablets (50-100 mg total) by mouth daily as needed for erectile dysfunction. (Patient not taking: Reported on 05/30/2021), Disp: 10 tablet, Rfl: 0 ? ? ? ?Review of Systems ? ?Per HPI unless specifically indicated above ? ?   ?Objective:  ?  ?BP (!) 146/80   Pulse 67   Temp 98.5 ?F (36.9 ?C)   Wt 190 lb (86.2 kg)   SpO2 95%   BMI 28.89 kg/m?   ?Wt Readings from Last 3 Encounters:  ?06/07/21 190 lb (86.2 kg)  ?06/03/21 186 lb (84.4 kg)  ?05/30/21 186 lb (84.4 kg)  ?  ?Physical Exam ?Vitals reviewed.  ?Constitutional:   ?   General: He is not in acute distress. ?   Appearance: He is well-developed. He is not ill-appearing.  ?HENT:  ?   Head: Normocephalic and atraumatic.  ?Cardiovascular:  ?  Rate and Rhythm: Normal rate and regular rhythm.  ?Pulmonary:  ?   Effort: Pulmonary effort is normal.  ?   Breath sounds: Normal breath sounds. No wheezing.  ?Abdominal:  ?   General: Bowel sounds are normal.  ?   Palpations: Abdomen is soft.  ?   Tenderness: There is no abdominal tenderness.  ?Musculoskeletal:  ?   Cervical back: Neck supple.  ?   Right lower leg: No edema.  ?   Left lower leg: No edema.  ?Lymphadenopathy:  ?   Cervical: No cervical adenopathy.  ?Skin: ?   General: Skin is warm and dry.  ?Neurological:  ?   Mental Status: He is alert and oriented to person, place, and time.  ?Psychiatric:     ?   Attention and Perception: Attention normal.     ?   Mood and Affect: Mood is anxious.     ?   Speech: Speech normal.     ?   Behavior: Behavior normal. Behavior is cooperative.  ? ? ? ? ? ?   ?Assessment & Plan:  ? ?Encounter Diagnoses  ?Name Primary?  ?  Uncontrolled type 2 diabetes mellitus with hyperglycemia ([place]) Yes  ? Mental health problem   ? Primary hypertension   ? Hyperlipidemia, unspecified hyperlipidemia type   ? Chronic kidney disease, unspecified CKD stage   ? ? ? ?-Increase amlodipine to 10mg  ?-Increase lantus insulin to 50 units ?-discussed with pt that he needs to get established with new PCP as soon as he gets moved.  If he ends up staying in [facility], he should let us know and schedule to come in for follow up in 4-6 weeks.  He states understanding ?

## 2021-07-11 NOTE — Congregational Nurse Program (Signed)
?  Dept: [phone] ? ? ?Congregational Nurse Program Note ? ?Date of Encounter: 07/11/2021 ? ?Past Medical History: ?Past Medical History:  ?Diagnosis Date  ? Depression   ? Diabetes mellitus (HCC)   ? Dyslipidemia   ? Hyperlipidemia   ? Hypertension   ? ? ?Encounter Details: ? CNP Questionnaire - 07/11/21 0939   ? ?  ? Questionnaire  ? Do you give verbal consent to treat you today? Yes   ? Location Patient Served  [facility]   ? Visit Setting Church or Organization   ? Patient Status Unknown   ? Insurance Uninsured ([place] [employer]/[facility]/Self-Pay)   ? Insurance Referral N/A   ? Medication N/A   ? Medical Provider Yes   ? Screening Referrals N/A   ? Medical Referral Other   ? Medical Appointment Made N/A   ? Food N/A   ? Transportation N/A   ? Housing/Utilities N/A   ? Interpersonal Safety N/A   ? Intervention N/A   ? ED Visit Averted N/A   ? Life-Saving Intervention Made N/A   ? ?  ?  ? ?  ? ?Client into [facility] current with [facility]. ?Client is type 2 uncontrolled Diabetic. Last A1C 12.5 on 05/30/21. ?Discussed [employer] funded [employer] for Diabetics with client as he has expressed interest in "working out " more and exercising.  ?Client completed application and letter of need from [facility] sent to [facility] for approval.  ?Program intially is for 6 months and will be continued for a year total based on client utilizing services. He states understanding. ?Discussed role of exercise on lowering A1C along with medication and proper diet and nutrition. Client is excited to begin.  ?He shares he has a new job for a [employer] he has started and that is going well.  ?Discussed with client the [facility] program and will follow up with client once he is approved for the scholarship and discuss next steps of 1). Tour of facilities, 3 no charge [employer] with a trainer to develop a personalized exercise program. ? ?Plan: await approval from the [facility] and begin  exercise program. Will provide client with information regarding importance of exercise on diabetes and lowering A1C from [facility].  ?Client agrees to monitor blood sugars as directed by primary care provider and maintain a logsheet. Client to notify this RN if he needs more testing supplies.  ?Client is agreeable. ? ? ?Francee Nodal RN ?[employer] ? ? ?

## 2021-07-14 ENCOUNTER — Emergency Department (HOSPITAL_COMMUNITY)
Admission: EM | Admit: 2021-07-14 | Discharge: 2021-07-14 | Disposition: A | Discharge status: Home / Self Care | Payer: Medicaid Other | Attending: Student | Admitting: Student

## 2021-07-14 ENCOUNTER — Encounter (HOSPITAL_COMMUNITY): Payer: Self-pay

## 2021-07-14 ENCOUNTER — Other Ambulatory Visit: Payer: Self-pay

## 2021-07-14 DIAGNOSIS — E1165 Type 2 diabetes mellitus with hyperglycemia: Secondary | ICD-10-CM | POA: Insufficient documentation

## 2021-07-14 DIAGNOSIS — Z5321 Procedure and treatment not carried out due to patient leaving prior to being seen by health care provider: Secondary | ICD-10-CM | POA: Insufficient documentation

## 2021-07-14 LAB — CBG MONITORING, ED: Glucose-Capillary: 108 mg/dL — ABNORMAL HIGH (ref 70–99)

## 2021-07-14 NOTE — ED Notes (Signed)
Pt states he thought he was supposed to come to the ER for A1C bloodwork. CBG 108. NAD.  ?

## 2021-07-14 NOTE — ED Triage Notes (Signed)
Pt presents to ED for hyperglycemia. Pt states [facility]. Pt states he normally goes through ED to have labs done. Pt takes Lantus.  ?

## 2021-07-17 ENCOUNTER — Ambulatory Visit: Payer: Medicaid Other | Admitting: Physician Assistant

## 2021-07-17 ENCOUNTER — Encounter: Payer: Self-pay | Admitting: Physician Assistant

## 2021-07-17 ENCOUNTER — Other Ambulatory Visit: Payer: Self-pay | Admitting: Physician Assistant

## 2021-07-17 VITALS — BP 160/90 | HR 80 | Temp 98.1°F

## 2021-07-17 DIAGNOSIS — I10 Essential (primary) hypertension: Secondary | ICD-10-CM

## 2021-07-17 DIAGNOSIS — Z1211 Encounter for screening for malignant neoplasm of colon: Secondary | ICD-10-CM

## 2021-07-17 DIAGNOSIS — N189 Chronic kidney disease, unspecified: Secondary | ICD-10-CM

## 2021-07-17 DIAGNOSIS — E1165 Type 2 diabetes mellitus with hyperglycemia: Secondary | ICD-10-CM

## 2021-07-17 DIAGNOSIS — E785 Hyperlipidemia, unspecified: Secondary | ICD-10-CM

## 2021-07-17 NOTE — Progress Notes (Signed)
? ?BP (!) 160/90   Pulse 80   Temp 98.1 ?F (36.7 ?C)   SpO2 98%   ? ?Subjective:  ? ? Patient ID: Troy Gordon, male    DOB: Feb 07, 1966, 56 y.o.   MRN: [id number] ? ?HPI: ?Troy Gordon is a 56 y.o. male presenting on 07/17/2021 for Diabetes, Hypertension, and Hyperlipidemia ? ? ?HPI ? ?Chief Complaint  ?Patient presents with  ? Diabetes  ? Hypertension  ? Hyperlipidemia  ? ? ? ?Pt no longer working at [employer].  He is now working in Nurse, mental health at [employer].  He is liking it.   ?He just recently got out of a "toxic" relationship about the end of march.  He is no longer doing drugs. ?He says he is doing great and doesn't feel depression or unwell.  ? ?He is monitoring his BS-    ?Highest is 190.  Mostly `125 ? ?And his BP- ?yesterday 100/82 ? ? ? ? ? ?Relevant past medical, surgical, family and social history reviewed and updated as indicated. Interim medical history since our last visit reviewed. ?Allergies and medications reviewed and updated. ? ? ? ?Current Outpatient Medications:  ?  amLODipine (NORVASC) 10 MG tablet, Take 1 tablet (10 mg total) by mouth daily., Disp: 30 tablet, Rfl: 0 ?  atorvastatin (LIPITOR) 80 MG tablet, Take 1 tablet (80 mg total) by mouth daily. For high cholesterol, Disp: 90 tablet, Rfl: 0 ?  cloNIDine (CATAPRES) 0.1 MG tablet, Take 1 tablet (0.1 mg total) by mouth 2 (two) times daily., Disp: 60 tablet, Rfl: 1 ?  hydrochlorothiazide (HYDRODIURIL) 25 MG tablet, Take 1 tablet (25 mg total) by mouth daily., Disp: 90 tablet, Rfl: 0 ?  insulin glargine (LANTUS) 100 UNIT/ML injection, Inject 0.5 mLs (50 Units total) into the skin daily. For diabetes management, Disp: 10 mL, Rfl: 0 ?  lisinopril (ZESTRIL) 20 MG tablet, Take 1 tablet (20 mg total) by mouth daily., Disp: 90 tablet, Rfl: 0 ?  buPROPion (WELLBUTRIN XL) 150 MG 24 hr tablet, Take 1 tablet (150 mg total) by mouth daily. For depression (Patient not taking: Reported on 07/17/2021), Disp: 30 tablet, Rfl: 0 ?  sertraline (ZOLOFT) 50 MG  tablet, Take 1 tablet (50 mg total) by mouth daily. (Patient not taking: Reported on 07/17/2021), Disp: 30 tablet, Rfl: 1 ?  sildenafil (VIAGRA) 100 MG tablet, Take 0.5-1 tablets (50-100 mg total) by mouth daily as needed for erectile dysfunction. (Patient not taking: Reported on 05/30/2021), Disp: 10 tablet, Rfl: 0 ? ? ?Review of Systems ? ?Per HPI unless specifically indicated above ? ?   ?Objective:  ?  ?BP (!) 160/90   Pulse 80   Temp 98.1 ?F (36.7 ?C)   SpO2 98%   ?Wt Readings from Last 3 Encounters:  ?07/14/21 196 lb (88.9 kg)  ?06/07/21 190 lb (86.2 kg)  ?06/03/21 186 lb (84.4 kg)  ?  ?Physical Exam ?Vitals reviewed.  ?Constitutional:   ?   General: He is not in acute distress. ?   Appearance: He is well-developed. He is not ill-appearing.  ?HENT:  ?   Head: Normocephalic and atraumatic.  ?Cardiovascular:  ?   Rate and Rhythm: Normal rate and regular rhythm.  ?Pulmonary:  ?   Effort: Pulmonary effort is normal.  ?   Breath sounds: Normal breath sounds. No wheezing.  ?Abdominal:  ?   General: Bowel sounds are normal.  ?   Palpations: Abdomen is soft.  ?   Tenderness: There is no abdominal tenderness.  ?  Musculoskeletal:  ?   Cervical back: Neck supple.  ?   Right lower leg: No edema.  ?   Left lower leg: No edema.  ?Lymphadenopathy:  ?   Cervical: No cervical adenopathy.  ?Skin: ?   General: Skin is warm and dry.  ?Neurological:  ?   Mental Status: He is alert and oriented to person, place, and time.  ?Psychiatric:     ?   Attention and Perception: Attention normal.     ?   Mood and Affect: Affect is not inappropriate.     ?   Speech: Speech normal.     ?   Behavior: Behavior normal. Behavior is cooperative.  ?   Comments: engaged  ? ? ? ? ? ?   ?Assessment & Plan:  ? ?Encounter Diagnoses  ?Name Primary?  ? Uncontrolled type 2 diabetes mellitus with hyperglycemia ([place]) Yes  ? Primary hypertension   ? Hyperlipidemia, unspecified hyperlipidemia type   ? Chronic kidney disease, unspecified CKD stage   ? Screening  for colon cancer   ? ? ? ?-DM foot exam was updated ?-pt Updated san[employer]i application ?-pt is given FIT test for  colon cancer screening ?-pt was educated and encouraged to get covid vaccination ?-Pt declined [facility]/ counseling appointment ?-Pt to monitor bp at home- pt to notify office if it stays over 160 ?-pt to follow up 6 wk- recheck bp update labs.  He is to contact office sooner prn ?

## 2021-07-21 ENCOUNTER — Telehealth: Payer: Self-pay

## 2021-07-21 NOTE — Telephone Encounter (Signed)
Client of  [facility] called, He had applied for scholarship with [employer] and grant monies for Diabetic program with [facility]. He has been approved. He will go register at [facility] and will be given a tour and set up for 3 free [employer](trainer) sessions to develop a plan. ?Discussed with client that this is good for 6 months and will be available for him to use at any time during [facility] hours. Use will be monitored and after 6 months if client is utilizing this program may be extended for another 6 months for a total of 12 months maximum.. ? ?Will monitor client's pre [employer] A[employer] and again after 3 months using the [facility] facilities. Will also follow up with client weekly to evaluate his blood sugars. Client agrees to monitor his blood sugars per provider's recommendation and or at least each morning fasting and at Bedtime. If client has need of testing supplies he will contact CM with [facility]. Client will keep log sheets of recorded blood sugars for his provider and will follow up with his primary care provider as directed by provider. ? ?Client is agreeable. He will contact me once he has registered and received his [employer] card and had his first tour. ? ?Francee Nodal RN ? ?[employer] ?

## 2021-07-24 ENCOUNTER — Telehealth: Payer: Self-pay | Admitting: Physician Assistant

## 2021-07-24 LAB — FECAL OCCULT BLOOD, IMMUNOCHEMICAL: IFOBT: NEGATIVE

## 2021-07-24 LAB — POC FIT TEST STOOL: Fecal Occult Blood: NEGATIVE

## 2021-07-24 NOTE — Telephone Encounter (Signed)
Pt was called and notified that FIT test results normal ?

## 2021-08-05 ENCOUNTER — Other Ambulatory Visit: Payer: Self-pay

## 2021-08-05 ENCOUNTER — Encounter (HOSPITAL_COMMUNITY): Payer: Self-pay | Admitting: *Deleted

## 2021-08-05 ENCOUNTER — Emergency Department (HOSPITAL_COMMUNITY)
Admission: EM | Admit: 2021-08-05 | Discharge: 2021-08-05 | Disposition: A | Discharge status: Home / Self Care | Payer: Medicaid Other | Attending: Emergency Medicine | Admitting: Emergency Medicine

## 2021-08-05 ENCOUNTER — Emergency Department (HOSPITAL_COMMUNITY): Payer: Medicaid Other

## 2021-08-05 DIAGNOSIS — I10 Essential (primary) hypertension: Secondary | ICD-10-CM | POA: Insufficient documentation

## 2021-08-05 DIAGNOSIS — Z794 Long term (current) use of insulin: Secondary | ICD-10-CM | POA: Insufficient documentation

## 2021-08-05 DIAGNOSIS — M25511 Pain in right shoulder: Secondary | ICD-10-CM | POA: Insufficient documentation

## 2021-08-05 DIAGNOSIS — Z79899 Other long term (current) drug therapy: Secondary | ICD-10-CM | POA: Insufficient documentation

## 2021-08-05 DIAGNOSIS — E119 Type 2 diabetes mellitus without complications: Secondary | ICD-10-CM | POA: Insufficient documentation

## 2021-08-05 NOTE — ED Triage Notes (Signed)
Pt with an old injury to right shoulder 4-5 months ago, pain has gotten worse over the past 3-4 weeks.

## 2021-08-05 NOTE — ED Notes (Signed)
ED Provider at bedside. 

## 2021-08-05 NOTE — ED Provider Notes (Signed)
[facility] EMERGENCY DEPARTMENT Provider Note   CSN: [id number] Arrival date & time: 08/05/21  1406     History  Chief Complaint  Patient presents with   Shoulder Pain    right    Troy Gordon is a 56 y.o. male.   Shoulder Pain  Patient with medical history of hypertension, type 2 diabetes, hyperlipidemia and anxiety presents today due to right shoulder pain.  This started 6 months ago after a fall.  The pain is only to the right shoulder, it comes and goes.  It worsened about 2 weeks ago when he started working out and lifting weights more regularly.  The pain is intermittent and reproduced by shoulder abduction and abduction.  It feels sharp with movement, sometimes he feels like there are pins-and-needles.  He is right-hand dominant, no previous surgeries or injuries to the right shoulder that he is aware of.  He takes his 50 mg of Aleve in the morning and that helps with the pain.  Works as a Glass blower/designer throughout the day.    He denies any chest pain, shortness of breath, abdominal pain.  Home Medications Prior to Admission medications   Medication Sig Start Date End Date Taking? Authorizing Provider  amLODipine (NORVASC) 10 MG tablet Take 1 tablet (10 mg total) by mouth daily. 06/07/21   Soyla Dryer, PA-C  atorvastatin (LIPITOR) 80 MG tablet Take 1 tablet (80 mg total) by mouth daily. For high cholesterol 04/26/21   Soyla Dryer, PA-C  cloNIDine (CATAPRES) 0.1 MG tablet Take 1 tablet (0.1 mg total) by mouth 2 (two) times daily. 03/18/21   Corky Sox, MD  hydrochlorothiazide (HYDRODIURIL) 25 MG tablet Take 1 tablet (25 mg total) by mouth daily. 04/26/21   Soyla Dryer, PA-C  insulin glargine (LANTUS) 100 UNIT/ML injection Inject 0.5 mLs (50 Units total) into the skin daily. For diabetes management 06/07/21   Soyla Dryer, PA-C  lisinopril (ZESTRIL) 20 MG tablet Take 1 tablet (20 mg total) by mouth daily. 04/26/21   Soyla Dryer, PA-C  sildenafil  (VIAGRA) 100 MG tablet Take 0.5-1 tablets (50-100 mg total) by mouth daily as needed for erectile dysfunction. Patient not taking: Reported on 05/30/2021 04/26/21   Soyla Dryer, PA-C      Allergies    Patient has no known allergies.    Review of Systems   Review of Systems  Physical Exam Updated Vital Signs BP 132/78 (BP Location: Left Arm)   Pulse 81   Temp 98 F (36.7 C) (Oral)   Resp 18   Ht 5\' 8"  (1.727 m)   Wt 91.2 kg   SpO2 100%   BMI 30.56 kg/m  Physical Exam Vitals and nursing note reviewed. Exam conducted with a chaperone present.  Constitutional:      General: He is not in acute distress.    Appearance: Normal appearance.  HENT:     Head: Normocephalic and atraumatic.  Eyes:     General: No scleral icterus.    Extraocular Movements: Extraocular movements intact.     Pupils: Pupils are equal, round, and reactive to light.  Cardiovascular:     Pulses: Normal pulses.  Musculoskeletal:        General: Tenderness present.     Comments: Right shoulder: Reproducible pain with abduction, internal and external rotation.  Mild tenderness over the acromion process in the subscapular spine.  No crepitus, full ROM to wrist, elbow.  Skin:    Capillary Refill: Capillary refill takes less than 2 seconds.  Coloration: Skin is not jaundiced.     Findings: No bruising or erythema.  Neurological:     Mental Status: He is alert. Mental status is at baseline.     Coordination: Coordination normal.     Comments: Sensation to light touch grossly intact to upper extremity b/l  Psychiatric:        Mood and Affect: Mood normal.    ED Results / Procedures / Treatments   Labs (all labs ordered are listed, but only abnormal results are displayed) Labs Reviewed - No data to display  EKG None  Radiology DG Shoulder Right  Result Date: 08/05/2021 CLINICAL DATA:  Right shoulder pain for several weeks, no known injury EXAM: RIGHT SHOULDER - 2+ VIEW COMPARISON:  None  Available. FINDINGS: There is no evidence of fracture or dislocation. There is no evidence of arthropathy or other focal bone abnormality. Soft tissues are unremarkable. IMPRESSION: No fracture or dislocation of the right shoulder. Joint spaces are preserved. Electronically Signed   By: Delanna Ahmadi M.D.   On: 08/05/2021 14:45    Procedures Procedures    Medications Ordered in ED Medications - No data to display  ED Course/ Medical Decision Making/ A&P                           Medical Decision Making Amount and/or Complexity of Data Reviewed Radiology: ordered.   Patient presents with right shoulder pain.  Differential diagnosis includes but not limited to radiculopathy, muscle strain, rotator cuff injury, occult fracture, dislocation.  On exam patient is neurovascular intact with brisk cap refill and 2+ radial pulse.  Grip strength is equal bilaterally, ROM to olecranon and wrist without any deficits.  Tolerates passive ROM, not febrile and I doubt this would be a septic joint.  There is no reproducible tenderness there is no specific bony tenderness or crepitus.  No rashes overlying the skin.  I ordered and viewed the plain film of his right shoulder.  There is no evidence of OA, fracture, mass, dislocation.  Agree with radiologist interpretation.  Do not think patient needs any additional work-up at this time.  Discussed increasing NSAID and I will provide orthopedic referral for orthopedic follow-up.  Discussed return precaution with the patient, he was discharged in stable condition.        Final Clinical Impression(s) / ED Diagnoses Final diagnoses:  Right shoulder pain, unspecified chronicity    Rx / DC Orders ED Discharge Orders     None         Sherrill Raring, PA-C 08/05/21 1544    Milton Ferguson, MD 08/08/21 1511

## 2021-08-05 NOTE — Discharge Instructions (Addendum)
Take 650 of Aleve every 8 hours with food and water.  Call to schedule follow-up with orthopedics, information provided above.  Return to the ED if you start having chest pain, numbness, loss of sensation in your hand.

## 2021-08-14 ENCOUNTER — Other Ambulatory Visit: Payer: Self-pay | Admitting: Physician Assistant

## 2021-08-14 DIAGNOSIS — E1165 Type 2 diabetes mellitus with hyperglycemia: Secondary | ICD-10-CM

## 2021-08-14 DIAGNOSIS — E785 Hyperlipidemia, unspecified: Secondary | ICD-10-CM

## 2021-08-14 DIAGNOSIS — I10 Essential (primary) hypertension: Secondary | ICD-10-CM

## 2021-08-14 DIAGNOSIS — Z125 Encounter for screening for malignant neoplasm of prostate: Secondary | ICD-10-CM

## 2021-08-14 DIAGNOSIS — N189 Chronic kidney disease, unspecified: Secondary | ICD-10-CM

## 2021-08-25 ENCOUNTER — Other Ambulatory Visit (HOSPITAL_COMMUNITY)
Admission: RE | Admit: 2021-08-25 | Discharge: 2021-08-25 | Disposition: A | Discharge status: Home / Self Care | Payer: Self-pay | Source: Ambulatory Visit | Attending: Physician Assistant | Admitting: Physician Assistant

## 2021-08-25 DIAGNOSIS — E1165 Type 2 diabetes mellitus with hyperglycemia: Secondary | ICD-10-CM | POA: Insufficient documentation

## 2021-08-25 DIAGNOSIS — Z125 Encounter for screening for malignant neoplasm of prostate: Secondary | ICD-10-CM | POA: Insufficient documentation

## 2021-08-25 DIAGNOSIS — I10 Essential (primary) hypertension: Secondary | ICD-10-CM | POA: Insufficient documentation

## 2021-08-25 DIAGNOSIS — N189 Chronic kidney disease, unspecified: Secondary | ICD-10-CM | POA: Insufficient documentation

## 2021-08-25 DIAGNOSIS — E785 Hyperlipidemia, unspecified: Secondary | ICD-10-CM | POA: Insufficient documentation

## 2021-08-25 LAB — COMPREHENSIVE METABOLIC PANEL
ALT: 27 U/L (ref 0–44)
AST: 22 U/L (ref 15–41)
Albumin: 4.1 g/dL (ref 3.5–5.0)
Alkaline Phosphatase: 71 U/L (ref 38–126)
Anion gap: 8 (ref 5–15)
BUN: 27 mg/dL — ABNORMAL HIGH (ref 6–20)
CO2: 24 mmol/L (ref 22–32)
Calcium: 9.6 mg/dL (ref 8.9–10.3)
Chloride: 107 mmol/L (ref 98–111)
Creatinine, Ser: 1.28 mg/dL — ABNORMAL HIGH (ref 0.61–1.24)
GFR, Estimated: >60 mL/min (ref >60)
Glucose, Bld: 108 mg/dL — ABNORMAL HIGH (ref 70–99)
Potassium: 3.4 mmol/L — ABNORMAL LOW (ref 3.5–5.1)
Sodium: 139 mmol/L (ref 135–145)
Total Bilirubin: 0.9 mg/dL (ref 0.3–1.2)
Total Protein: 7.6 g/dL (ref 6.5–8.1)

## 2021-08-25 LAB — HEMOGLOBIN A1C
Hgb A1c MFr Bld: 9.2 % — ABNORMAL HIGH (ref 4.8–5.6)
Mean Plasma Glucose: 217.34 mg/dL

## 2021-08-25 LAB — LIPID PANEL
Cholesterol: 191 mg/dL (ref 0–200)
HDL: 42 mg/dL (ref >40)
LDL Cholesterol: 108 mg/dL — ABNORMAL HIGH (ref 0–99)
Total CHOL/HDL Ratio: 4.5 RATIO
Triglycerides: 203 mg/dL — ABNORMAL HIGH (ref <150)
VLDL: 41 mg/dL — ABNORMAL HIGH (ref 0–40)

## 2021-08-25 LAB — PSA: Prostatic Specific Antigen: 0.5 ng/mL (ref 0.00–4.00)

## 2021-08-27 LAB — MICROALBUMIN, URINE: Microalb, Ur: 27.3 ug/mL — ABNORMAL HIGH

## 2021-08-28 ENCOUNTER — Ambulatory Visit: Payer: Medicaid Other | Admitting: Physician Assistant

## 2021-08-28 ENCOUNTER — Encounter: Payer: Self-pay | Admitting: Physician Assistant

## 2021-08-28 VITALS — BP 131/84 | HR 68 | Temp 98.1°F

## 2021-08-28 DIAGNOSIS — N189 Chronic kidney disease, unspecified: Secondary | ICD-10-CM

## 2021-08-28 DIAGNOSIS — E1165 Type 2 diabetes mellitus with hyperglycemia: Secondary | ICD-10-CM

## 2021-08-28 DIAGNOSIS — I10 Essential (primary) hypertension: Secondary | ICD-10-CM

## 2021-08-28 DIAGNOSIS — E785 Hyperlipidemia, unspecified: Secondary | ICD-10-CM

## 2021-08-28 NOTE — Patient Instructions (Signed)
Insulin Injection Instructions, Single Insulin Dose, Adult ?There are many different types of insulin. The type of insulin that you take may determine how many injections you give yourself and when you need to give the injections. ?Supplies needed: ?Soap and water. ?Insulin medicine in small bottles (vials). ?A new, unused insulin syringe. ?Alcohol wipes. ?A disposal container for sharp items (sharps container), such as an empty plastic bottle with a cover. ?How to choose a site for injection ?The body absorbs insulin differently, depending on where the insulin is injected (injection site). It is best to inject insulin into the same body area each time; for example, always in the abdomen. However, you should use a different spot in that area for each injection. Do not inject the insulin in the same spot each time. There are five main areas that can be used for injecting. These areas are: ?Abdomen. This is the preferred area. ?Front of thigh. ?Upper, outer side of thigh. ?Upper, outer side of arm. ?Upper, outer part of buttock. ?How to give a single-dose insulin injection ?Get ready ?Wash your hands with soap and water for at least 20 seconds. If soap and water are not available, use hand sanitizer. ?Test your blood sugar (glucose) level and write down that number. Follow any instructions from your health care provider about what to do if your blood glucose level is higher or lower than your normal range. ?Make sure the vial you are using has the right kind of insulin and there is enough insulin for your dose. ?Check the expiration date. ?Check to make sure you have the correct type of insulin syringe for the concentration of insulin that you are using. ?Use a new, unused insulin syringe each time you need to inject insulin. ?If you are using CLEAR insulin, check to see that it is clear and free of clumps. ?If you are using CLOUDY insulin, mix it by gently rolling the insulin vial between your palms several times. Do  not shake the vial. ?Remove the plastic pop-top covering from the vial of insulin. This type of covering is present on a vial when it is new. ?Use an alcohol wipe to clean the rubber top of the vial. ?Remove the plastic cover from the syringe needle. Do not let the needle touch anything. ?Push air into the vial ?To bring (draw up) air into the syringe, slowly pull back on the syringe plunger. Stop pulling the plunger when the dose indicator gets to the number of units that you will be using. ?While you keep the vial right-side-up, poke the needle through the rubber top of the vial. Do not turn the vial upside down to do this. ?Push the plunger all the way into the syringe. Doing that will push air into the vial. ?Do not take the needle out of the vial yet. ?Fill the syringe ? ?While the needle is still in the vial, turn the vial upside down and hold it at eye level. ?Slowly pull back on the plunger. Stop pulling the plunger when the dose indicator gets to the desired number of units. ?If you see air bubbles in the syringe, slowly move the plunger up and down 2 or 3 times to make them go away. ?If you had to move the plunger to get rid of air bubbles, pull back the plunger until the dose indicator returns to the correct dose. ?Remove the needle from the vial. Do not let the needle touch anything. ?Inject the insulin ? ?Use an alcohol wipe to   clean the site where you will be inserting the needle. Let the site air-dry. ?Hold the syringe in your writing hand like a pencil. ?If directed by your health care provider, use your other hand to pinch and hold about an inch (2.5 cm) of skin at the injection site. Do not directly touch the cleaned part of the skin. ?Gently but quickly, put the needle straight into the skin. The needle should be at a 45-degree angle or a 90-degree angle (perpendicular) to the skin, as told by your health care provider. ?Push the needle in as far as it will go (to the hub). ?When the needle is  completely inserted into the skin, let go of the skin that you are pinching. Continue to hold the syringe in place with your writing hand. ?Use your thumb or index finger of your writing hand to push the plunger all the way into the syringe to inject the insulin. ?Wait 5-10 seconds, then pull the needle straight out of the skin. This will allow all of the insulin to go from the syringe and needle into your body. ?If bleeding occurs, press and hold gauze over the injection site until any bleeding stops. Do not rub the area. ?Do not put the plastic cover back on the needle. ?Discard the syringe and needle directly into a sharps container. ?How to throw away supplies ?Discard all used needles in a sharps container. ?Follow the disposal regulations for the area where you live. Do not use any syringe or needle more than one time. ?Throw away empty vials in the regular trash. ?Questions to ask your health care provider ?How often should I be taking insulin? ?How often should I check my blood glucose? ?What amount of insulin should I be taking at each time? ?What are the side effects? ?What should I do if my blood glucose is too high? ?What should I do if my blood glucose is too low? ?What should I do if I forget to take my insulin? ?What number should I call if I have questions? ?Where to find more information ?American Diabetes Association (ADA): diabetes.org ?Association of Diabetes Care and Education Specialists (ADCES): diabeteseducator.org ?Summary ?Before you give yourself an insulin injection, be sure to wash your hands for at least 20 seconds and test your blood glucose level. Write down that number. ?Check the expiration date and the type of insulin that you are using. The type of insulin that you take may determine how many injections you give yourself and when you need to give the injections. ?It is best to inject insulin into the same body area each time; for example, always in the abdomen. However, you should  use a different spot in that area for each injection. ?Do not use an insulin syringe more than one time. ?This information is not intended to replace advice given to you by your health care provider. Make sure you discuss any questions you have with your health care provider. ?Document Revised: 05/16/2020 Document Reviewed: 12/26/2019 ?Elsevier Patient Education ? 2023 Elsevier Inc. ? ?

## 2021-08-28 NOTE — Progress Notes (Unsigned)
BP 131/84   Pulse 68   Temp 98.1 F (36.7 C)   SpO2 98%    Subjective:    Patient ID: Troy Gordon, male    DOB: 1966-01-14, 56 y.o.   MRN: [id number]  HPI: Troy Gordon is a 56 y.o. male presenting on 08/28/2021 for Diabetes, Hypertension, and Hyperlipidemia   HPI   Chief Complaint  Patient presents with   Diabetes   Hypertension   Hyperlipidemia    Pt says he is doing good.  He says he is having No depression.  He is working at [employer].  He is a Location manager.   He is supposed to be getting insurance soon.     He did not bring his meds again.    He is checking his bs.  It is running about 132 average.  Highest 144, lowest 126.  He is using vials- 50 units.    He is usually checking his bs bid-  morning and before supper.   He says he needs some instruction with how to use a vial.      Relevant past medical, surgical, family and social history reviewed and updated as indicated. Interim medical history since our last visit reviewed. Allergies and medications reviewed and updated.   Current Outpatient Medications:    amLODipine (NORVASC) 10 MG tablet, Take 1 tablet (10 mg total) by mouth daily., Disp: 30 tablet, Rfl: 0   atorvastatin (LIPITOR) 80 MG tablet, Take 1 tablet (80 mg total) by mouth daily. For high cholesterol, Disp: 90 tablet, Rfl: 0   cloNIDine (CATAPRES) 0.1 MG tablet, Take 1 tablet (0.1 mg total) by mouth 2 (two) times daily., Disp: 60 tablet, Rfl: 1   hydrochlorothiazide (HYDRODIURIL) 25 MG tablet, Take 1 tablet (25 mg total) by mouth daily., Disp: 90 tablet, Rfl: 0   insulin glargine (LANTUS) 100 UNIT/ML injection, Inject 0.5 mLs (50 Units total) into the skin daily. For diabetes management, Disp: 10 mL, Rfl: 0   lisinopril (ZESTRIL) 20 MG tablet, Take 1 tablet (20 mg total) by mouth daily., Disp: 90 tablet, Rfl: 0   sildenafil (VIAGRA) 100 MG tablet, Take 0.5-1 tablets (50-100 mg total) by mouth daily as needed for erectile dysfunction.  (Patient not taking: Reported on 05/30/2021), Disp: 10 tablet, Rfl: 0     Review of Systems  Per HPI unless specifically indicated above     Objective:    BP 131/84   Pulse 68   Temp 98.1 F (36.7 C)   SpO2 98%   Wt Readings from Last 3 Encounters:  08/05/21 201 lb (91.2 kg)  07/14/21 196 lb (88.9 kg)  06/07/21 190 lb (86.2 kg)    Physical Exam Vitals reviewed.  Constitutional:      General: He is not in acute distress.    Appearance: He is well-developed. He is not ill-appearing.  HENT:     Head: Normocephalic and atraumatic.  Cardiovascular:     Rate and Rhythm: Normal rate and regular rhythm.  Pulmonary:     Effort: Pulmonary effort is normal.     Breath sounds: Normal breath sounds. No wheezing.  Abdominal:     General: Bowel sounds are normal.     Palpations: Abdomen is soft.     Tenderness: There is no abdominal tenderness.  Musculoskeletal:     Cervical back: Neck supple.     Right lower leg: No edema.     Left lower leg: No edema.  Lymphadenopathy:  Cervical: No cervical adenopathy.  Skin:    General: Skin is warm and dry.  Neurological:     Mental Status: He is alert and oriented to person, place, and time.  Psychiatric:        Attention and Perception: Attention normal.        Speech: Speech normal.        Behavior: Behavior normal. Behavior is cooperative.     Results for orders placed or performed during the hospital encounter of 08/25/21  Hemoglobin A1c  Result Value Ref Range   Hgb A1c MFr Bld 9.2 (H) 4.8 - 5.6 %   Mean Plasma Glucose 217.34 mg/dL  Microalbumin, urine  Result Value Ref Range   Microalb, Ur 27.3 (H) Not Estab. ug/mL  PSA  Result Value Ref Range   Prostatic Specific Antigen 0.50 0.00 - 4.00 ng/mL  Lipid panel  Result Value Ref Range   Cholesterol 191 0 - 200 mg/dL   Triglycerides 025 (H) <150 mg/dL   HDL 42 >85 mg/dL   Total CHOL/HDL Ratio 4.5 RATIO   VLDL 41 (H) 0 - 40 mg/dL   LDL Cholesterol 277 (H) 0 - 99 mg/dL   Comprehensive metabolic panel  Result Value Ref Range   Sodium 139 135 - 145 mmol/L   Potassium 3.4 (L) 3.5 - 5.1 mmol/L   Chloride 107 98 - 111 mmol/L   CO2 24 22 - 32 mmol/L   Glucose, Bld 108 (H) 70 - 99 mg/dL   BUN 27 (H) 6 - 20 mg/dL   Creatinine, Ser 8.24 (H) 0.61 - 1.24 mg/dL   Calcium 9.6 8.9 - 23.5 mg/dL   Total Protein 7.6 6.5 - 8.1 g/dL   Albumin 4.1 3.5 - 5.0 g/dL   AST 22 15 - 41 U/L   ALT 27 0 - 44 U/L   Alkaline Phosphatase 71 38 - 126 U/L   Total Bilirubin 0.9 0.3 - 1.2 mg/dL   GFR, Estimated >36 >14 mL/min   Anion gap 8 5 - 15      Assessment & Plan:    Encounter Diagnoses  Name Primary?   Uncontrolled type 2 diabetes mellitus with hyperglycemia (HCC) Yes   Primary hypertension    Hyperlipidemia, unspecified hyperlipidemia type    Chronic kidney disease, unspecified CKD stage       DM -pt is shown how to use insulin from a vial and he successfully performed the task himself.  He was given handouts on the process as well. -discussed with pt that a1c is not consistent with his reported bs readings.  May be due to better control more recently and higher bs > a month ago.  Pt is encouraged to record his bs on a bs log and bring it with him to his next appointment -DM medications may need to be added.  He says he was on metformin in the past and he didn't do well on it due to side effects.  He says he was on [place] in the past but it was stopped, likely at one of his hospitalizations when he was having issues with MH  Dyslipidemia -pt is not to goal.  Need to confirm what medication pt is taking.  If he is taking atorvastatini 80mg , he may be candidate for pcsk9 inhibitor  HTN -pt to continue amlodipine, clonidine, hctz and lisinopril    -pt to follow up 3-4 weeks with his bs log.  He is to contact office sooner prn

## 2021-09-03 ENCOUNTER — Encounter (HOSPITAL_COMMUNITY): Payer: Self-pay

## 2021-09-03 ENCOUNTER — Emergency Department (HOSPITAL_COMMUNITY)
Admission: EM | Admit: 2021-09-03 | Discharge: 2021-09-03 | Disposition: A | Discharge status: Home / Self Care | Payer: Self-pay | Attending: Emergency Medicine | Admitting: Emergency Medicine

## 2021-09-03 ENCOUNTER — Other Ambulatory Visit: Payer: Self-pay

## 2021-09-03 DIAGNOSIS — Z20822 Contact with and (suspected) exposure to covid-19: Secondary | ICD-10-CM | POA: Insufficient documentation

## 2021-09-03 DIAGNOSIS — F1990 Other psychoactive substance use, unspecified, uncomplicated: Secondary | ICD-10-CM | POA: Insufficient documentation

## 2021-09-03 DIAGNOSIS — F32A Depression, unspecified: Secondary | ICD-10-CM

## 2021-09-03 DIAGNOSIS — I129 Hypertensive chronic kidney disease with stage 1 through stage 4 chronic kidney disease, or unspecified chronic kidney disease: Secondary | ICD-10-CM | POA: Insufficient documentation

## 2021-09-03 DIAGNOSIS — R45851 Suicidal ideations: Secondary | ICD-10-CM | POA: Insufficient documentation

## 2021-09-03 DIAGNOSIS — N189 Chronic kidney disease, unspecified: Secondary | ICD-10-CM | POA: Insufficient documentation

## 2021-09-03 LAB — ETHANOL: Alcohol, Ethyl (B): <10 mg/dL (ref <10)

## 2021-09-03 LAB — RAPID URINE DRUG SCREEN, HOSP PERFORMED
Amphetamines: NOT DETECTED
Barbiturates: NOT DETECTED
Benzodiazepines: NOT DETECTED
Cocaine: POSITIVE — AB
Opiates: NOT DETECTED
Tetrahydrocannabinol: NOT DETECTED

## 2021-09-03 LAB — CBC
HCT: 39.6 % (ref 39.0–52.0)
Hemoglobin: 13 g/dL (ref 13.0–17.0)
MCH: 27.9 pg (ref 26.0–34.0)
MCHC: 32.8 g/dL (ref 30.0–36.0)
MCV: 85 fL (ref 80.0–100.0)
Platelets: 180 10*3/uL (ref 150–400)
RBC: 4.66 MIL/uL (ref 4.22–5.81)
RDW: 14.4 % (ref 11.5–15.5)
WBC: 10.6 10*3/uL — ABNORMAL HIGH (ref 4.0–10.5)
nRBC: 0 % (ref 0.0–0.2)

## 2021-09-03 LAB — BASIC METABOLIC PANEL
Anion gap: 10 (ref 5–15)
BUN: 25 mg/dL — ABNORMAL HIGH (ref 6–20)
CO2: 24 mmol/L (ref 22–32)
Calcium: 8.8 mg/dL — ABNORMAL LOW (ref 8.9–10.3)
Chloride: 106 mmol/L (ref 98–111)
Creatinine, Ser: 1.38 mg/dL — ABNORMAL HIGH (ref 0.61–1.24)
GFR, Estimated: 60 mL/min — ABNORMAL LOW (ref >60)
Glucose, Bld: 122 mg/dL — ABNORMAL HIGH (ref 70–99)
Potassium: 3.4 mmol/L — ABNORMAL LOW (ref 3.5–5.1)
Sodium: 140 mmol/L (ref 135–145)

## 2021-09-03 MED ORDER — ACETAMINOPHEN 325 MG PO TABS: 650.0000 mg | ORAL_TABLET | ORAL | Status: DC | PRN

## 2021-09-03 MED ORDER — ATORVASTATIN CALCIUM 40 MG PO TABS
80.0000 mg | ORAL_TABLET | Freq: Every day | ORAL | Status: DC
  Administered 2021-09-03: 80 mg via ORAL
  Filled 2021-09-03: qty 2

## 2021-09-03 MED ORDER — AMLODIPINE BESYLATE 5 MG PO TABS
10.0000 mg | ORAL_TABLET | Freq: Every day | ORAL | Status: DC
  Administered 2021-09-03: 10 mg via ORAL
  Filled 2021-09-03: qty 2

## 2021-09-03 MED ORDER — LORAZEPAM 1 MG PO TABS: 1.0000 mg | ORAL_TABLET | Freq: Four times a day (QID) | ORAL | Status: DC | PRN

## 2021-09-03 MED ORDER — LISINOPRIL 10 MG PO TABS
20.0000 mg | ORAL_TABLET | Freq: Every day | ORAL | Status: DC
  Administered 2021-09-03: 20 mg via ORAL
  Filled 2021-09-03: qty 2

## 2021-09-03 NOTE — ED Notes (Signed)
Patient given dinner. Patient ate 100% breakfast and lunch.

## 2021-09-03 NOTE — ED Notes (Signed)
Pt given belongings to change for discharge.

## 2021-09-07 ENCOUNTER — Telehealth: Payer: Self-pay

## 2021-09-07 NOTE — Telephone Encounter (Signed)
Attempted call for follow up. No answer, left Voicemail requesting return call.  Francee Nodal RN [employer]

## 2021-09-08 NOTE — Telephone Encounter (Signed)
Called client for follow up regarding recent ER visit. Client was at work and could not talk. He states he will call back when he is off work around 2:30 PM.   Francee Nodal RN [employer]

## 2021-09-18 ENCOUNTER — Ambulatory Visit: Payer: Medicaid Other | Admitting: Physician Assistant

## 2021-09-23 ENCOUNTER — Other Ambulatory Visit: Payer: Self-pay

## 2021-09-23 ENCOUNTER — Emergency Department (HOSPITAL_COMMUNITY)
Admission: EM | Admit: 2021-09-23 | Discharge: 2021-09-23 | Disposition: A | Discharge status: Home / Self Care | Payer: Medicaid Other | Attending: Emergency Medicine | Admitting: Emergency Medicine

## 2021-09-23 ENCOUNTER — Ambulatory Visit (HOSPITAL_COMMUNITY)
Admission: RE | Admit: 2021-09-23 | Discharge: 2021-09-23 | Disposition: A | Discharge status: Home / Self Care | Payer: Self-pay | Attending: Psychiatry | Admitting: Psychiatry

## 2021-09-23 ENCOUNTER — Encounter (HOSPITAL_COMMUNITY): Payer: Self-pay | Admitting: Emergency Medicine

## 2021-09-23 ENCOUNTER — Ambulatory Visit (HOSPITAL_COMMUNITY)
Admission: EM | Admit: 2021-09-23 | Discharge: 2021-09-24 | Disposition: A | Discharge status: Home / Self Care | Payer: No Payment, Other | Attending: Family | Admitting: Family

## 2021-09-23 DIAGNOSIS — F1429 Cocaine dependence with unspecified cocaine-induced disorder: Secondary | ICD-10-CM | POA: Insufficient documentation

## 2021-09-23 DIAGNOSIS — I10 Essential (primary) hypertension: Secondary | ICD-10-CM | POA: Insufficient documentation

## 2021-09-23 DIAGNOSIS — F331 Major depressive disorder, recurrent, moderate: Secondary | ICD-10-CM | POA: Diagnosis present

## 2021-09-23 DIAGNOSIS — F1410 Cocaine abuse, uncomplicated: Secondary | ICD-10-CM

## 2021-09-23 DIAGNOSIS — F1420 Cocaine dependence, uncomplicated: Secondary | ICD-10-CM | POA: Insufficient documentation

## 2021-09-23 DIAGNOSIS — F3340 Major depressive disorder, recurrent, in remission, unspecified: Secondary | ICD-10-CM | POA: Diagnosis present

## 2021-09-23 DIAGNOSIS — E119 Type 2 diabetes mellitus without complications: Secondary | ICD-10-CM | POA: Insufficient documentation

## 2021-09-23 DIAGNOSIS — Z87891 Personal history of nicotine dependence: Secondary | ICD-10-CM | POA: Insufficient documentation

## 2021-09-23 LAB — GLUCOSE, CAPILLARY
Glucose-Capillary: 310 mg/dL — ABNORMAL HIGH (ref 70–99)
Glucose-Capillary: 246 mg/dL — ABNORMAL HIGH (ref 70–99)

## 2021-09-23 MED ORDER — LORAZEPAM 1 MG PO TABS
1.0000 mg | ORAL_TABLET | Freq: Once | ORAL | Status: AC
  Administered 2021-09-23: 1 mg via ORAL
  Filled 2021-09-23: qty 1

## 2021-09-23 MED ORDER — INSULIN ASPART 100 UNIT/ML IJ SOLN
0.0000 [IU] | Freq: Three times a day (TID) | INTRAMUSCULAR | Status: DC
  Administered 2021-09-23: 9 [IU] via SUBCUTANEOUS
  Administered 2021-09-24: 3 [IU] via SUBCUTANEOUS

## 2021-09-23 MED ORDER — ALUM & MAG HYDROXIDE-SIMETH 200-200-20 MG/5ML PO SUSP: 30.0000 mL | ORAL | Status: DC | PRN

## 2021-09-23 MED ORDER — ACETAMINOPHEN 325 MG PO TABS: 650.0000 mg | ORAL_TABLET | Freq: Four times a day (QID) | ORAL | Status: DC | PRN

## 2021-09-23 MED ORDER — TRAZODONE HCL 50 MG PO TABS
50.0000 mg | ORAL_TABLET | Freq: Every evening | ORAL | Status: DC | PRN
  Administered 2021-09-23: 50 mg via ORAL
  Filled 2021-09-23: qty 14
  Filled 2021-09-23: qty 1
  Filled 2021-09-23 (×3): qty 14

## 2021-09-23 MED ORDER — INSULIN ASPART 100 UNIT/ML IJ SOLN
0.0000 [IU] | Freq: Every day | INTRAMUSCULAR | Status: DC
  Administered 2021-09-23: 2 [IU] via SUBCUTANEOUS

## 2021-09-23 MED ORDER — MAGNESIUM HYDROXIDE 400 MG/5ML PO SUSP: 30.0000 mL | Freq: Every day | ORAL | Status: DC | PRN

## 2021-09-23 NOTE — ED Notes (Signed)
Pt sleeping@this time. Breathing even and unlabored. Will continue to monitor for safety 

## 2021-09-23 NOTE — Discharge Instructions (Signed)
You were evaluated in the Emergency Department and after careful evaluation, we did not find any emergent condition requiring admission or further testing in the hospital.  Your exam/testing today is overall reassuring.  Recommend following up with the resources provided.  Please return to the Emergency Department if you experience any worsening of your condition.   Thank you for allowing [place] to be a part of your care.

## 2021-09-23 NOTE — ED Notes (Signed)
Pt calm and cooperative. No c/o pain or distress. Will continue to monitor for safety 

## 2021-09-23 NOTE — Discharge Instructions (Addendum)
Substance Abuse Resources   [facility] - Admissions are currently completed Monday through Friday at 8am; both appointments and walk-ins are accepted.  Any individual that is a [place] resident may present for a substance abuse screening and assessment for admission.  A person may be referred by numerous sources or self-refer.   Potential clients will be screened for medical necessity and appropriateness for the program.  Clients must meet criteria for high-intensity residential treatment services.  If clinically appropriate, a client will continue with the comprehensive clinical assessment and intake process, as well as enrollment in the [facility].  Address: [address] [place], [place] [address] Admin Hours: Mon-Fri 8AM to 5PM Center Hours: 24/7 Phone: [phone] Fax: [phone]  [facility] Based Crisis:  These are 3 locations for services for week and weekend: Please call before arrival:    [facility] ([facility]  Address: [address] [place], [place] [address] Phone: [phone]  [facility] ([facility] Address: [address], [place], [place] [address] Phone#: [phone]  [facility] ([facility]) Address: [address], [place], [place] [address] Phone#: [phone]   [facility] (offers outpatient therapy and intensive outpatient substance abuse therapy).  [address], [place], [place] [address] Phone: [phone]   Phone: [phone]  The [facility] SA Intensive Outpatient Program (SAIOP) means structured individual and group addiction activities and services that are provided at an outpatient program designed to assist adult and adolescent consumers to begin recovery and learn skills for recovery maintenance. The [facility] SAIOP program is offered at least 3 hours a  day, 3 days a week. SAIOP services shall include a structured program consisting of, but not limited to, the following services: Individual counseling and support; Group counseling and support; Family counseling, training or support; Biochemical assays to identify recent drug use (e.g., urine drug screens); Strategies for relapse prevention to include community and social support systems in treatment; Life skills; Crisis contingency planning; Disease Management; and Treatment support activities that have been adapted or specifically designed for persons with physical disabilities, or persons with co-occurring disorders of mental illness and substance abuse/dependence or mental retardation/developmental disability and substance abuse/dependence.  Phone: [phone]   [facility]  Address: [address], [place], [place] [address] Phone: [phone]   [facility] Address: [address], [place], [place] [address] Phone: [phone]  - a combination of group and individual sessions to meet the participants needs. This allows participants to engage in treatment and remain involved in their home and work life. - Transitional housing places program participants in a supportive living environment while they complete a treatment program and work to secure independent housing. - The Substance Abuse Intensive Outpatient Treatment Program at [facility] consists of structured group sessions and individual sessions that are designed to teach participants early recovery and relapse prevention skills. -[facility] works with the [facility] [employer] to provide a housing and treatment program for homeless veterans.   [facility]   Address: [address] [place],  [address] Phone#: [phone]   : Referrals to [facility] facilities can be made by [facility] and [facility].  Referrals are also  accepted from physicians, private providers, hospital emergency rooms, family members, or any person who has knowledge of someone in the need of our services.    The [place] [facility] will also offer the following outpatient services: (Monday through Friday 8am-5pm)   Partial Hospitalization Program (PHP) Substance Abuse Intensive Outpatient Program (SA-IOP) Group Therapy Medication Management Peer Living Room We also provide (24/7):  Assessments: Our mental health clinician and providers will conduct a focused mental health evaluation, assessing for immediate safety concerns and further mental health needs. Referral: Our team will provide resources and help connect to community based mental health treatment, when indicated, including psychotherapy, psychiatry, and other specialized behavioral health or substance use disorder services (for those not already in treatment). Transitional Care: Our team providers in person bridging and/or telephonic follow-up during the patient's transition to outpatient services.     [facility] 24-Hour Call Center: [phone] Behavioral Health Crisis Line: [phone]  

## 2021-09-23 NOTE — ED Notes (Signed)
Patients girlfriend brought in clothes,  flip flops, insulin needles, glucose meter, etc. Along with his insulin [facility]. Insulin is in the fridge with patients name on it.

## 2021-09-23 NOTE — H&P (Signed)
Behavioral Health Medical Screening Exam  Troy Gordon is an 56 y.o. male with past psychiatric history of cocaine use disorder, severe dependence and major depressive disorder who presented to to [facility] voluntarily for assessment of depression, suicidal ideations, and substance abuse. Patient reports having suicidal ideations related to recent relapse with crack cocaine; last use yesterday. Denies intent or plan to harm himself, auditory or visual hallucinations. Expressed readiness for change and interest in possible rehab. Provider and social work discussed possible options; pt in agreement with treatment options. Social work assisting with placement. Safe transport called for transfer.   Total Time spent with patient: 20 minutes  Psychiatric Specialty Exam: Physical Exam Vitals and nursing note reviewed.  Constitutional:      Appearance: Normal appearance. He is normal weight. He is not ill-appearing or diaphoretic.  HENT:     Head: Normocephalic.     Nose: Nose normal.     Mouth/Throat:     Mouth: Mucous membranes are moist.     Pharynx: Oropharynx is clear.  Eyes:     Pupils: Pupils are equal, round, and reactive to light.  Cardiovascular:     Rate and Rhythm: Normal rate.     Pulses: Normal pulses.  Pulmonary:     Effort: Pulmonary effort is normal.  Abdominal:     Palpations: Abdomen is soft.  Musculoskeletal:        General: Normal range of motion.     Cervical back: Normal range of motion.  Skin:    General: Skin is warm and dry.  Neurological:     Mental Status: He is alert and oriented to person, place, and time. Mental status is at baseline.  Psychiatric:        Attention and Perception: Attention and perception normal. He does not perceive auditory or visual hallucinations.        Mood and Affect: Mood and affect normal.        Speech: Speech normal.        Behavior: Behavior is withdrawn. Behavior is cooperative.        Thought Content: Thought content is not  paranoid or delusional. Thought content does not include homicidal or suicidal ideation. Thought content does not include homicidal or suicidal plan.        Cognition and Memory: Cognition and memory normal.        Judgment: Judgment is impulsive.   Review of Systems  Psychiatric/Behavioral:  Positive for dysphoric mood and suicidal ideas.   All other systems reviewed and are negative.  There were no vitals taken for this visit.There is no height or weight on file to calculate BMI. General Appearance: Casual and Disheveled Eye Contact:  Fair Speech:  Clear and Coherent Volume:  Normal Mood:  Dysphoric and Hopeless Affect:  Blunt Thought Process:  Coherent Orientation:  Full (Time, Place, and Person) Thought Content:  WDL Suicidal Thoughts:  Yes.  without intent/plan Homicidal Thoughts:  No Memory:  Immediate;   Fair Recent;   Fair Remote;   Fair Judgement:  Fair Insight:  Fair Psychomotor Activity:  Normal Concentration: Concentration: Fair and Attention Span: Fair Recall:  [employer]:Fair Language: Fair Akathisia:  NA Handed:  Right AIMS (if indicated):    Assets:  Physical Health Resilience Social Support Sleep:     Musculoskeletal: Strength & Muscle Tone: within normal limits Gait & Station: normal Patient leans: N/A  There were no vitals taken for this visit.  Recommendations: Based on my evaluation the  patient does not appear to have an emergency medical condition. Patient initially accepted to [facility] [place]; upon arrival notified no beds available. Pt accepted to [facility] for continuous observation.   Loletta Parish, NP 09/23/2021, 12:22 PM

## 2021-09-23 NOTE — ED Provider Notes (Cosign Needed Addendum)
[facility] Urgent Care Continuous Assessment Admission H&P  Date: 09/23/21 Patient Name: Troy Gordon MRN: [id number] Chief Complaint: No chief complaint on file.     Diagnoses:  Final diagnoses:  Cocaine abuse (HCC)    HPI: Troy Gordon is a 56 year old African-American male who presents with concerns of residential treatment for substance abuse/ use with cocaine.  Patient was initially seen and evaluated at [facility] where he was later transferred to [facility] facility for potential admission for residential treatment.  However, it was reported when patient arrived he was unable to be accepted due to no "male beds" and patient didn't have his own home medications available.  Patient was then transported back to [facility] care facility.  He is awaiting for family members to bring insulin and clothing.  Patient was seen and evaluated face-to-face.  He is denying suicidal or homicidal ideations.  Denies auditory visual hallucinations.  He reported he has attempted a residential treatment in the past however was unsuccessful.  He provided verbal authorization for this provider to contact his significant other [name] at [phone].  She denied any safety concerns with patient returning home however states " he must have a bed Monday because he is not able to stay here."   NP spoke to social worker who reports patient is able to return to at [facility] facility with medications and safe transport to be arranged for admission later this afternoon or first thing on 09/24/2021.  Patient was receptive to plan.  Support, encouragement and reassurance was provided.  During evaluation Troy Gordon is sitting  in no acute distress. She is alert/oriented x 4; calm/cooperative; and mood congruent with affect.She is speaking in a clear tone at moderate volume, and normal pace; with good eye contact. Her thought process is coherent and relevant; There is no indication that she is currently  responding to internal/external stimuli or experiencing delusional thought content; and she has denied suicidal/self-harm/homicidal ideation, psychosis, and paranoia.   Patient has remained calm throughout assessment and has answered questions appropriately.       PHQ 2-9:  Flowsheet Row ED from 05/30/2021 in [facility] EMERGENCY DEPARTMENT ED from 06/07/2020 in [facility] EMERGENCY DEPARTMENT Office Visit from 11/22/2017 in [facility] [place], [place]  Thoughts that you would be better off dead, or of hurting yourself in some way More than half the days More than half the days Several days  PHQ-9 Total Score Flowsheet Row ED from 09/23/2021 in [facility] EMERGENCY DEPARTMENT ED from 09/03/2021 in [facility] EMERGENCY DEPARTMENT ED from 08/05/2021 in [facility] EMERGENCY DEPARTMENT  C-SSRS RISK CATEGORY No Risk Low Risk No Risk        Total Time spent with patient: 15 minutes  Musculoskeletal  Strength & Muscle Tone: within normal limits Gait & Station: normal Patient leans: N/A  Psychiatric Specialty Exam  Presentation General Appearance: Appropriate for Environment  Eye Contact:Good  Speech:Clear and Coherent  Speech Volume:Normal  Handedness:Right   Mood and Affect  Mood:Anxious  Affect:Congruent   Thought Process  Thought Processes:Coherent; Goal Directed  Descriptions of Associations:Intact  Orientation:Full (Time, Place and Person)  Thought Content:Logical  Diagnosis of Schizophrenia or Schizoaffective disorder in past: No   Hallucinations:Hallucinations: None  Ideas of Reference:None  Suicidal Thoughts:Suicidal Thoughts: No  Homicidal Thoughts:Homicidal Thoughts: No   Sensorium  Memory:Immediate Fair; Remote Fair  Judgment:Fair  Insight:Fair   Executive Functions  Concentration:Fair  Attention Span:Good  Recall:Good  Fund of Knowledge:Good  Language:Good   Psychomotor Activity  Psychomotor Activity:Psychomotor Activity:  Extrapyramidal Side Effects (EPS); Normal AIMS Completed?: No   Assets  Assets:Housing; Desire for Improvement   Sleep  Sleep:Sleep: Fair   Nutritional Assessment (For OBS and FBC admissions only) Has the patient had a weight loss or gain of 10 pounds or more in the last 3 months?: No Has the patient had a decrease in food intake/or appetite?: No Does the patient have dental problems?: No Does the patient have eating habits or behaviors that may be indicators of an eating disorder including binging or inducing vomiting?: No Has the patient recently lost weight without trying?: 0 Has the patient been eating poorly because of a decreased appetite?: 0 Malnutrition Screening Tool Score: 0    Physical Exam Vitals and nursing note reviewed.  HENT:     Nose: Nose normal.  Cardiovascular:     Rate and Rhythm: Normal rate and regular rhythm.  Pulmonary:     Effort: Pulmonary effort is normal.     Breath sounds: Normal breath sounds.  Abdominal:     General: Bowel sounds are normal.  Neurological:     Mental Status: He is alert.  Psychiatric:        Mood and Affect: Mood normal.        Thought Content: Thought content normal.    Review of Systems  Eyes: Negative.   Respiratory: Negative.    Cardiovascular: Negative.   Genitourinary: Negative.   Psychiatric/Behavioral:  Positive for depression and substance abuse. Negative for suicidal ideas. The patient is nervous/anxious.   All other systems reviewed and are negative.   Blood pressure (!) 171/88, pulse 75, temperature 98.1 F (36.7 C), temperature source Oral, resp. rate 16, SpO2 100 %. There is no height or weight on file to calculate BMI.  Past Psychiatric History:   Is the patient at risk to self? No  Has the patient been a risk to self in the past 6 months? No .    Has the patient been a risk to self within the distant past? No   Is the patient a risk to others? No   Has the patient been a risk to others in the  past 6 months? No   Has the patient been a risk to others within the distant past? No   Past Medical History:  Past Medical History:  Diagnosis Date   Depression    Diabetes mellitus (HCC)    Dyslipidemia    Hyperlipidemia    Hypertension     Past Surgical History:  Procedure Laterality Date   CIRCUMCISION     as an adult    Family History:  Family History  Problem Relation Age of Onset   Diabetes Maternal Grandmother    Hypertension Maternal Grandmother    Hypertension Father     Social History:  Social History   Socioeconomic History   Marital status: Single    Spouse name: Not on file   Number of children: Not on file   Years of education: Not on file   Highest education level: Not on file  Occupational History   Not on file  Tobacco Use   Smoking status: Former    Years: 1.00    Types: Cigarettes   Smokeless tobacco: Never   Tobacco comments:    1 cig every 5 weeks  Vaping Use   Vaping Use: Never used  Substance and Sexual Activity   Alcohol use: Not Currently  Comment: occasionally beer   Drug use: Yes    Types: Marijuana, Cocaine    Comment: denies use 08/05/21   Sexual activity: Not on file  Other Topics Concern   Not on file  Social History Narrative   Not on file   Social Determinants of Health   Financial Resource Strain: Not on file  Food Insecurity: Not on file  Transportation Needs: Not on file  Physical Activity: Not on file  Stress: Not on file  Social Connections: Not on file  Intimate Partner Violence: Not on file    SDOH:  SDOH Screenings   Alcohol Screen: Low Risk  (03/12/2021)   Alcohol Screen    Last Alcohol Screening Score (AUDIT): 3  Depression (PHQ2-9): Medium Risk (05/31/2021)   Depression (PHQ2-9)    PHQ-2 Score: 13  Financial Resource Strain: Not on file  Food Insecurity: Not on file  Housing: Not on file  Physical Activity: Not on file  Social Connections: Not on file  Stress: Not on file  Tobacco Use:  Medium Risk (09/23/2021)   Patient History    Smoking Tobacco Use: Former    Smokeless Tobacco Use: Never    Passive Exposure: Not on file  Transportation Needs: Not on file    Last Labs:  Admission on 09/03/2021, Discharged on 09/03/2021  Component Date Value Ref Range Status   WBC 09/03/2021 10.6 (H)  4.0 - 10.5 K/uL Final   RBC 09/03/2021 4.66  4.22 - 5.81 MIL/uL Final   Hemoglobin 09/03/2021 13.0  13.0 - 17.0 g/dL Final   HCT 16/12/9602 39.6  39.0 - 52.0 % Final   MCV 09/03/2021 85.0  80.0 - 100.0 fL Final   MCH 09/03/2021 27.9  26.0 - 34.0 pg Final   MCHC 09/03/2021 32.8  30.0 - 36.0 g/dL Final   RDW 54/11/8117 14.4  11.5 - 15.5 % Final   Platelets 09/03/2021 180  150 - 400 K/uL Final   nRBC 09/03/2021 0.0  0.0 - 0.2 % Final   Performed at [facility], [address] [place], [place] [address]   Sodium 09/03/2021 140  135 - 145 mmol/L Final   Potassium 09/03/2021 3.4 (L)  3.5 - 5.1 mmol/L Final   Chloride 09/03/2021 106  98 - 111 mmol/L Final   CO2 09/03/2021 24  22 - 32 mmol/L Final   Glucose, Bld 09/03/2021 122 (H)  70 - 99 mg/dL Final   Glucose reference range applies only to samples taken after fasting for at least 8 hours.   BUN 09/03/2021 25 (H)  6 - 20 mg/dL Final   Creatinine, Ser 09/03/2021 1.38 (H)  0.61 - 1.24 mg/dL Final   Calcium 95/62/1308 8.8 (L)  8.9 - 10.3 mg/dL Final   GFR, Estimated 09/03/2021 60 (L)  >60 mL/min Final   Comment: (NOTE) Calculated using the CKD-EPI Creatinine Equation (2021)    Anion gap 09/03/2021 10  5 - 15 Final   Performed at [facility], [address], [place], [place] [address]   Alcohol, Ethyl (B) 09/03/2021 <10  <10 mg/dL Final   Comment: (NOTE) Lowest detectable limit for serum alcohol is 10 mg/dL.  For medical purposes only. Performed at [facility], [address] [place], [place] [address]    Opiates 09/03/2021 NONE DETECTED  NONE DETECTED Final   Cocaine 09/03/2021 POSITIVE (A)  NONE DETECTED Final   Benzodiazepines  09/03/2021 NONE DETECTED  NONE DETECTED Final   Amphetamines 09/03/2021 NONE DETECTED  NONE DETECTED Final  Tetrahydrocannabinol 09/03/2021 NONE DETECTED  NONE DETECTED Final   Barbiturates 09/03/2021 NONE DETECTED  NONE DETECTED Final   Comment: (NOTE) DRUG SCREEN FOR MEDICAL PURPOSES ONLY.  IF CONFIRMATION IS NEEDED FOR ANY PURPOSE, NOTIFY LAB WITHIN 5 DAYS.  LOWEST DETECTABLE LIMITS FOR URINE DRUG SCREEN Drug Class                     Cutoff (ng/mL) Amphetamine and metabolites    1000 Barbiturate and metabolites    200 Benzodiazepine                 200 Tricyclics and metabolites     300 Opiates and metabolites        300 Cocaine and metabolites        300 THC                            50 Performed at [facility], [address] [place], [place] [address]   Hospital Outpatient Visit on 08/25/2021  Component Date Value Ref Range Status   Hgb A1c MFr Bld 08/25/2021 9.2 (H)  4.8 - 5.6 % Final   Comment: (NOTE) Pre diabetes:          5.7%-6.4%  Diabetes:              >6.4%  Glycemic control for   <7.0% adults with diabetes    Mean Plasma Glucose 08/25/2021 217.34  mg/dL Final   Performed at [facility], [address], [place], [place] [address]   Microalb, Ur 08/25/2021 27.3 (H)  Not Estab. ug/mL Final   Comment: (NOTE) Performed At: [facility] [address] [place], [place] [id number] Jolene Schimke MD QM:[id number]    Prostatic Specific Antigen 08/25/2021 0.50  0.00 - 4.00 ng/mL Final   Comment: (NOTE) While PSA levels of <=4.0 ng/ml are reported as reference range, some men with levels below 4.0 ng/ml can have prostate cancer and many men with PSA above 4.0 ng/ml do not have prostate cancer.  Other tests such as free PSA, age specific reference ranges, PSA velocity and PSA doubling time may be helpful especially in men less than 34 years old. Performed at [facility], [address], [place], [place] [address]    Cholesterol  08/25/2021 191  0 - 200 mg/dL Final   Triglycerides 24/40/1027 203 (H)  <150 mg/dL Final   HDL 25/36/6440 42  >40 mg/dL Final   Total CHOL/HDL Ratio 08/25/2021 4.5  RATIO Final   VLDL 08/25/2021 41 (H)  0 - 40 mg/dL Final   LDL Cholesterol 08/25/2021 108 (H)  0 - 99 mg/dL Final   Comment:        Total Cholesterol/HDL:CHD Risk Coronary Heart Disease Risk Table                     Men   Women  1/2 Average Risk   3.4   3.3  Average Risk       5.0   4.4  2 X Average Risk   9.6   7.1  3 X Average Risk  23.4   11.0        Use the calculated Patient Ratio above and the CHD Risk Table to determine the patient's CHD Risk.        ATP III CLASSIFICATION (LDL):  <100     mg/dL   Optimal  347-425  mg/dL  Near or Above                    Optimal  130-159  mg/dL   Borderline  277-412  mg/dL   High  >878     mg/dL   Very High Performed at [facility], [address] [name], [place] [address]    Sodium 08/25/2021 139  135 - 145 mmol/L Final   Potassium 08/25/2021 3.4 (L)  3.5 - 5.1 mmol/L Final   Chloride 08/25/2021 107  98 - 111 mmol/L Final   CO2 08/25/2021 24  22 - 32 mmol/L Final   Glucose, Bld 08/25/2021 108 (H)  70 - 99 mg/dL Final   Glucose reference range applies only to samples taken after fasting for at least 8 hours.   BUN 08/25/2021 27 (H)  6 - 20 mg/dL Final   Creatinine, Ser 08/25/2021 1.28 (H)  0.61 - 1.24 mg/dL Final   Calcium 09/47/0962 9.6  8.9 - 10.3 mg/dL Final   Total Protein 83/66/2947 7.6  6.5 - 8.1 g/dL Final   Albumin 65/46/5035 4.1  3.5 - 5.0 g/dL Final   AST 46/56/8127 22  15 - 41 U/L Final   ALT 08/25/2021 27  0 - 44 U/L Final   Alkaline Phosphatase 08/25/2021 71  38 - 126 U/L Final   Total Bilirubin 08/25/2021 0.9  0.3 - 1.2 mg/dL Final   GFR, Estimated 08/25/2021 >60  >60 mL/min Final   Comment: (NOTE) Calculated using the CKD-EPI Creatinine Equation (2021)    Anion gap 08/25/2021 8  5 - 15 Final   Performed at [facility], [address]  [place], [place] [address]  Orders Only on 07/17/2021  Component Date Value Ref Range Status   Fecal Occult Blood 07/24/2021 Negative   Final  Admission on 07/14/2021, Discharged on 07/14/2021  Component Date Value Ref Range Status   Glucose-Capillary 07/14/2021 108 (H)  70 - 99 mg/dL Final   Glucose reference range applies only to samples taken after fasting for at least 8 hours.  Admission on 06/03/2021, Discharged on 06/03/2021  Component Date Value Ref Range Status   Sodium 06/03/2021 134 (L)  135 - 145 mmol/L Final   Potassium 06/03/2021 4.1  3.5 - 5.1 mmol/L Final   Chloride 06/03/2021 101  98 - 111 mmol/L Final   CO2 06/03/2021 24  22 - 32 mmol/L Final   Glucose, Bld 06/03/2021 329 (H)  70 - 99 mg/dL Final   Glucose reference range applies only to samples taken after fasting for at least 8 hours.   BUN 06/03/2021 27 (H)  6 - 20 mg/dL Final   Creatinine, Ser 06/03/2021 1.39 (H)  0.61 - 1.24 mg/dL Final   Calcium 17/49/4496 9.4  8.9 - 10.3 mg/dL Final   Total Protein 75/91/6384 7.1  6.5 - 8.1 g/dL Final   Albumin 66/59/9357 3.7  3.5 - 5.0 g/dL Final   AST 01/77/9390 25  15 - 41 U/L Final   ALT 06/03/2021 32  0 - 44 U/L Final   Alkaline Phosphatase 06/03/2021 78  38 - 126 U/L Final   Total Bilirubin 06/03/2021 0.4  0.3 - 1.2 mg/dL Final   GFR, Estimated 06/03/2021 59 (L)  >60 mL/min Final   Comment: (NOTE) Calculated using the CKD-EPI Creatinine Equation (2021)    Anion gap 06/03/2021 9  5 - 15 Final   Performed at [facility], [address] [place], [place] [address]   WBC 06/03/2021 8.6  4.0 -  10.5 K/uL Final   RBC 06/03/2021 5.12  4.22 - 5.81 MIL/uL Final   Hemoglobin 06/03/2021 14.3  13.0 - 17.0 g/dL Final   HCT 10/23/4816 42.7  39.0 - 52.0 % Final   MCV 06/03/2021 83.4  80.0 - 100.0 fL Final   MCH 06/03/2021 27.9  26.0 - 34.0 pg Final   MCHC 06/03/2021 33.5  30.0 - 36.0 g/dL Final   RDW 56/31/4970 13.4  11.5 - 15.5 % Final   Platelets 06/03/2021 146 (L)  150 - 400 K/uL  Final   nRBC 06/03/2021 0.0  0.0 - 0.2 % Final   Neutrophils Relative % 06/03/2021 54  % Final   Neutro Abs 06/03/2021 4.6  1.7 - 7.7 K/uL Final   Lymphocytes Relative 06/03/2021 38  % Final   Lymphs Abs 06/03/2021 3.3  0.7 - 4.0 K/uL Final   Monocytes Relative 06/03/2021 6  % Final   Monocytes Absolute 06/03/2021 0.5  0.1 - 1.0 K/uL Final   Eosinophils Relative 06/03/2021 1  % Final   Eosinophils Absolute 06/03/2021 0.1  0.0 - 0.5 K/uL Final   Basophils Relative 06/03/2021 0  % Final   Basophils Absolute 06/03/2021 0.0  0.0 - 0.1 K/uL Final   Immature Granulocytes 06/03/2021 1  % Final   Abs Immature Granulocytes 06/03/2021 0.10 (H)  0.00 - 0.07 K/uL Final   Performed at [facility], [address] [place], [place] [address]   Glucose-Capillary 06/03/2021 355 (H)  70 - 99 mg/dL Final   Glucose reference range applies only to samples taken after fasting for at least 8 hours.  Admission on 05/30/2021, Discharged on 06/03/2021  Component Date Value Ref Range Status   Glucose-Capillary 05/30/2021 >600 (HH)  70 - 99 mg/dL Final   Glucose reference range applies only to samples taken after fasting for at least 8 hours.   WBC 05/30/2021 8.2  4.0 - 10.5 K/uL Final   RBC 05/30/2021 5.17  4.22 - 5.81 MIL/uL Final   Hemoglobin 05/30/2021 14.3  13.0 - 17.0 g/dL Final   HCT 58/85/0277 41.5  39.0 - 52.0 % Final   MCV 05/30/2021 80.3  80.0 - 100.0 fL Final   MCH 05/30/2021 27.7  26.0 - 34.0 pg Final   MCHC 05/30/2021 34.5  30.0 - 36.0 g/dL Final   RDW 41/28/7867 12.8  11.5 - 15.5 % Final   Platelets 05/30/2021 168  150 - 400 K/uL Final   nRBC 05/30/2021 0.0  0.0 - 0.2 % Final   Neutrophils Relative % 05/30/2021 53  % Final   Neutro Abs 05/30/2021 4.4  1.7 - 7.7 K/uL Final   Lymphocytes Relative 05/30/2021 38  % Final   Lymphs Abs 05/30/2021 3.1  0.7 - 4.0 K/uL Final   Monocytes Relative 05/30/2021 6  % Final   Monocytes Absolute 05/30/2021 0.5  0.1 - 1.0 K/uL Final   Eosinophils Relative  05/30/2021 1  % Final   Eosinophils Absolute 05/30/2021 0.1  0.0 - 0.5 K/uL Final   Basophils Relative 05/30/2021 1  % Final   Basophils Absolute 05/30/2021 0.0  0.0 - 0.1 K/uL Final   Immature Granulocytes 05/30/2021 1  % Final   Abs Immature Granulocytes 05/30/2021 0.05  0.00 - 0.07 K/uL Final   Performed at [facility], [address] [place], [place] [address]   Color, Urine 05/30/2021 YELLOW  YELLOW Final   APPearance 05/30/2021 CLEAR  CLEAR Final   Specific Gravity, Urine 05/30/2021 1.025  1.005 - 1.030 Final  pH 05/30/2021 5.0  5.0 - 8.0 Final   Glucose, UA 05/30/2021 >=500 (A)  NEGATIVE mg/dL Final   Hgb urine dipstick 05/30/2021 NEGATIVE  NEGATIVE Final   Bilirubin Urine 05/30/2021 NEGATIVE  NEGATIVE Final   Ketones, ur 05/30/2021 NEGATIVE  NEGATIVE mg/dL Final   Protein, ur 29/52/841303/21/2023 NEGATIVE  NEGATIVE mg/dL Final   Nitrite 24/40/102703/21/2023 NEGATIVE  NEGATIVE Final   Leukocytes,Ua 05/30/2021 NEGATIVE  NEGATIVE Final   RBC / HPF 05/30/2021 0-5  0 - 5 RBC/hpf Final   WBC, UA 05/30/2021 0-5  0 - 5 WBC/hpf Final   Bacteria, UA 05/30/2021 NONE SEEN  NONE SEEN Final   Mucus 05/30/2021 PRESENT   Final   Performed at [facility], [address] [place], [place] [address]   Salicylate Lvl 05/30/2021 <7.0 (L)  7.0 - 30.0 mg/dL Final   Performed at [facility], [address] [place], [place] [address]   Acetaminophen (Tylenol), Serum 05/30/2021 <10 (L)  10 - 30 ug/mL Final   Comment: (NOTE) Therapeutic concentrations vary significantly. A range of 10-30 ug/mL  may be an effective concentration for many patients. However, some  are best treated at concentrations outside of this range. Acetaminophen concentrations >150 ug/mL at 4 hours after ingestion  and >50 ug/mL at 12 hours after ingestion are often associated with  toxic reactions.  Performed at [facility], [address] [place] [address] [place], [place] [address]    Opiates 05/30/2021 NONE DETECTED  NONE DETECTED Final   Cocaine 05/30/2021  POSITIVE (A)  NONE DETECTED Final   Benzodiazepines 05/30/2021 NONE DETECTED  NONE DETECTED Final   Amphetamines 05/30/2021 NONE DETECTED  NONE DETECTED Final   Tetrahydrocannabinol 05/30/2021 NONE DETECTED  NONE DETECTED Final   Barbiturates 05/30/2021 NONE DETECTED  NONE DETECTED Final   Comment: (NOTE) DRUG SCREEN FOR MEDICAL PURPOSES ONLY.  IF CONFIRMATION IS NEEDED FOR ANY PURPOSE, NOTIFY LAB WITHIN 5 DAYS.  LOWEST DETECTABLE LIMITS FOR URINE DRUG SCREEN Drug Class                     Cutoff (ng/mL) Amphetamine and metabolites    1000 Barbiturate and metabolites    200 Benzodiazepine                 200 Tricyclics and metabolites     300 Opiates and metabolites        300 Cocaine and metabolites        300 THC                            50 Performed at [facility], [address] [place], [place] [address]    Alcohol, Ethyl (B) 05/30/2021 <10  <10 mg/dL Final   Comment: (NOTE) Lowest detectable limit for serum alcohol is 10 mg/dL.  For medical purposes only. Performed at [facility], [address] [place], [place] [address]    Sodium 05/30/2021 130 (L)  135 - 145 mmol/L Final   Potassium 05/30/2021 3.8  3.5 - 5.1 mmol/L Final   Chloride 05/30/2021 95 (L)  98 - 111 mmol/L Final   CO2 05/30/2021 24  22 - 32 mmol/L Final   Glucose, Bld 05/30/2021 651 (HH)  70 - 99 mg/dL Final   Comment: Glucose reference range applies only to samples taken after fasting for at least 8 hours. CRITICAL RESULT CALLED TO, READ BACK BY AND VERIFIED WITH: LONG,L ON 05/30/21 AT 1455 BY LOY,C  BUN 05/30/2021 18  6 - 20 mg/dL Final   Creatinine, Ser 05/30/2021 1.60 (H)  0.61 - 1.24 mg/dL Final   Calcium 16/12/9602 9.2  8.9 - 10.3 mg/dL Final   Total Protein 54/11/8117 6.9  6.5 - 8.1 g/dL Final   Albumin 14/78/2956 3.6  3.5 - 5.0 g/dL Final   AST 21/30/8657 27  15 - 41 U/L Final   ALT 05/30/2021 38  0 - 44 U/L Final   Alkaline Phosphatase 05/30/2021 97  38 - 126 U/L Final   Total Bilirubin  05/30/2021 0.4  0.3 - 1.2 mg/dL Final   GFR, Estimated 05/30/2021 50 (L)  >60 mL/min Final   Comment: (NOTE) Calculated using the CKD-EPI Creatinine Equation (2021)    Anion gap 05/30/2021 11  5 - 15 Final   Performed at [facility], [address] [place], [place] [address]   Glucose-Capillary 05/30/2021 479 (H)  70 - 99 mg/dL Final   Glucose reference range applies only to samples taken after fasting for at least 8 hours.   Glucose-Capillary 05/30/2021 385 (H)  70 - 99 mg/dL Final   Glucose reference range applies only to samples taken after fasting for at least 8 hours.   Glucose-Capillary 05/30/2021 207 (H)  70 - 99 mg/dL Final   Glucose reference range applies only to samples taken after fasting for at least 8 hours.   Glucose-Capillary 05/30/2021 165 (H)  70 - 99 mg/dL Final   Glucose reference range applies only to samples taken after fasting for at least 8 hours.   Sodium 05/30/2021 138  135 - 145 mmol/L Final   DELTA CHECK NOTED   Potassium 05/30/2021 3.3 (L)  3.5 - 5.1 mmol/L Final   Chloride 05/30/2021 104  98 - 111 mmol/L Final   CO2 05/30/2021 25  22 - 32 mmol/L Final   Glucose, Bld 05/30/2021 179 (H)  70 - 99 mg/dL Final   Glucose reference range applies only to samples taken after fasting for at least 8 hours.   BUN 05/30/2021 16  6 - 20 mg/dL Final   Creatinine, Ser 05/30/2021 1.30 (H)  0.61 - 1.24 mg/dL Final   Calcium 29/52/8413 8.6 (L)  8.9 - 10.3 mg/dL Final   GFR, Estimated 05/30/2021 >60  >60 mL/min Final   Comment: (NOTE) Calculated using the CKD-EPI Creatinine Equation (2021)    Anion gap 05/30/2021 9  5 - 15 Final   Performed at [facility], [address] [place], [place] [address]   Glucose-Capillary 05/30/2021 160 (H)  70 - 99 mg/dL Final   Glucose reference range applies only to samples taken after fasting for at least 8 hours.   Hgb A1c MFr Bld 05/30/2021 12.5 (H)  4.8 - 5.6 % Final   Comment: (NOTE) Pre diabetes:          5.7%-6.4%  Diabetes:               >6.4%  Glycemic control for   <7.0% adults with diabetes    Mean Plasma Glucose 05/30/2021 312.05  mg/dL Final   Performed at [facility], [address] [place], [place] [address]   Glucose-Capillary 05/31/2021 257 (H)  70 - 99 mg/dL Final   Glucose reference range applies only to samples taken after fasting for at least 8 hours.   Comment 1 05/31/2021 Notify RN   Final   Comment 2 05/31/2021 Document in Chart   Final   Glucose-Capillary 05/31/2021 216 (H)  70 -  99 mg/dL Final   Glucose reference range applies only to samples taken after fasting for at least 8 hours.   Glucose-Capillary 05/31/2021 314 (H)  70 - 99 mg/dL Final   Glucose reference range applies only to samples taken after fasting for at least 8 hours.   Glucose-Capillary 05/31/2021 314 (H)  70 - 99 mg/dL Final   Glucose reference range applies only to samples taken after fasting for at least 8 hours.   SARS Coronavirus 2 by RT PCR 05/31/2021 NEGATIVE  NEGATIVE Final   Comment: (NOTE) SARS-CoV-2 target nucleic acids are NOT DETECTED.  The SARS-CoV-2 RNA is generally detectable in upper respiratory specimens during the acute phase of infection. The lowest concentration of SARS-CoV-2 viral copies this assay can detect is 138 copies/mL. A negative result does not preclude SARS-Cov-2 infection and should not be used as the sole basis for treatment or other patient management decisions. A negative result may occur with  improper specimen collection/handling, submission of specimen other than nasopharyngeal swab, presence of viral mutation(s) within the areas targeted by this assay, and inadequate number of viral copies(<138 copies/mL). A negative result must be combined with clinical observations, patient history, and epidemiological information. The expected result is Negative.  Fact Sheet for Patients:  BloggerCourse.com  Fact Sheet for Healthcare Providers:   SeriousBroker.it  This test is no                          t yet approved or cleared by the [place] FDA and  has been authorized for detection and/or diagnosis of SARS-CoV-2 by FDA under an Emergency Use Authorization (EUA). This EUA will remain  in effect (meaning this test can be used) for the duration of the COVID-19 declaration under Section 564(b)(1) of the Act, 21 U.S.C.section 360bbb-3(b)(1), unless the authorization is terminated  or revoked sooner.       Influenza A by PCR 05/31/2021 NEGATIVE  NEGATIVE Final   Influenza B by PCR 05/31/2021 NEGATIVE  NEGATIVE Final   Comment: (NOTE) The Xpert Xpress SARS-CoV-2/FLU/RSV plus assay is intended as an aid in the diagnosis of influenza from Nasopharyngeal swab specimens and should not be used as a sole basis for treatment. Nasal washings and aspirates are unacceptable for Xpert Xpress SARS-CoV-2/FLU/RSV testing.  Fact Sheet for Patients: BloggerCourse.com  Fact Sheet for Healthcare Providers: SeriousBroker.it  This test is not yet approved or cleared by the [place] FDA and has been authorized for detection and/or diagnosis of SARS-CoV-2 by FDA under an Emergency Use Authorization (EUA). This EUA will remain in effect (meaning this test can be used) for the duration of the COVID-19 declaration under Section 564(b)(1) of the Act, 21 U.S.C. section 360bbb-3(b)(1), unless the authorization is terminated or revoked.  Performed at [facility], [address], [place], [place] [address]    Glucose-Capillary 05/31/2021 303 (H)  70 - 99 mg/dL Final   Glucose reference range applies only to samples taken after fasting for at least 8 hours.   Glucose-Capillary 06/01/2021 360 (H)  70 - 99 mg/dL Final   Glucose reference range applies only to samples taken after fasting for at least 8 hours.   Glucose-Capillary 06/01/2021 239 (H)  70 - 99 mg/dL  Final   Glucose reference range applies only to samples taken after fasting for at least 8 hours.   Glucose-Capillary 06/01/2021 273 (H)  70 - 99 mg/dL Final   Glucose reference range applies only to samples taken  after fasting for at least 8 hours.   Glucose-Capillary 06/01/2021 287 (H)  70 - 99 mg/dL Final   Glucose reference range applies only to samples taken after fasting for at least 8 hours.   Glucose-Capillary 06/01/2021 248 (H)  70 - 99 mg/dL Final   Glucose reference range applies only to samples taken after fasting for at least 8 hours.   Comment 1 06/01/2021 Notify RN   Final   Comment 2 06/01/2021 Document in Chart   Final   Glucose-Capillary 06/02/2021 208 (H)  70 - 99 mg/dL Final   Glucose reference range applies only to samples taken after fasting for at least 8 hours.   Glucose-Capillary 06/02/2021 397 (H)  70 - 99 mg/dL Final   Glucose reference range applies only to samples taken after fasting for at least 8 hours.   Glucose-Capillary 06/02/2021 271 (H)  70 - 99 mg/dL Final   Glucose reference range applies only to samples taken after fasting for at least 8 hours.   Glucose-Capillary 06/02/2021 204 (H)  70 - 99 mg/dL Final   Glucose reference range applies only to samples taken after fasting for at least 8 hours.   Glucose-Capillary 06/02/2021 185 (H)  70 - 99 mg/dL Final   Glucose reference range applies only to samples taken after fasting for at least 8 hours.   Glucose-Capillary 06/02/2021 276 (H)  70 - 99 mg/dL Final   Glucose reference range applies only to samples taken after fasting for at least 8 hours.   Glucose-Capillary 06/03/2021 253 (H)  70 - 99 mg/dL Final   Glucose reference range applies only to samples taken after fasting for at least 8 hours.    Allergies: Patient has no known allergies.  PTA Medications: (Not in a hospital admission)   Medical Decision Making  Overnight observation Per CSW patient can be transferred to [facility] after home  supply of medications are available -Patient's significant other Tyler Aas reports she does not get off work until 7 PM tonight    Recommendations  Based on my evaluation the patient does not appear to have an emergency medical condition. -Anticipate discharge to [facility]  Oneta Rack, NP 09/23/21  1:27 PM

## 2021-09-23 NOTE — ED Provider Notes (Signed)
AP-EMERGENCY DEPT [facility] Emergency Department Provider Note MRN:  [id number]  Arrival date & time: 09/23/21     Chief Complaint   Wants Rehab   History of Present Illness   Troy Gordon is a 56 y.o. year-old male with a history of hypertension, diabetes presenting to the ED with chief complaint of wants rehab.  Patient wants rehab to help him stop using crack/cocaine.  Last use 4 hours ago.  Does not use every day.  Feels generally dehydrated but otherwise has no complaints at this time, no pain.  Review of Systems  A thorough review of systems was obtained and all systems are negative except as noted in the HPI and PMH.   Patient's Health History    Past Medical History:  Diagnosis Date   Depression    Diabetes mellitus (HCC)    Dyslipidemia    Hyperlipidemia    Hypertension     Past Surgical History:  Procedure Laterality Date   CIRCUMCISION     as an adult    Family History  Problem Relation Age of Onset   Diabetes Maternal Grandmother    Hypertension Maternal Grandmother    Hypertension Father     Social History   Socioeconomic History   Marital status: Single    Spouse name: Not on file   Number of children: Not on file   Years of education: Not on file   Highest education level: Not on file  Occupational History   Not on file  Tobacco Use   Smoking status: Former    Years: 1.00    Types: Cigarettes   Smokeless tobacco: Never   Tobacco comments:    1 cig every 5 weeks  Vaping Use   Vaping Use: Never used  Substance and Sexual Activity   Alcohol use: Not Currently    Comment: occasionally beer   Drug use: Yes    Types: Marijuana, Cocaine    Comment: denies use 08/05/21   Sexual activity: Not on file  Other Topics Concern   Not on file  Social History Narrative   Not on file   Social Determinants of Health   Financial Resource Strain: Not on file  Food Insecurity: Not on file  Transportation Needs: Not on file  Physical  Activity: Not on file  Stress: Not on file  Social Connections: Not on file  Intimate Partner Violence: Not on file     Physical Exam   Vitals:   09/23/21 0359  BP: (!) 187/125  Pulse: (!) 104  Resp: 16  Temp: 98 F (36.7 C)  SpO2: 97%    CONSTITUTIONAL: Well-appearing, NAD NEURO/PSYCH:  Alert and oriented x 3, no focal deficits EYES:  eyes equal and reactive ENT/NECK:  no LAD, no JVD CARDIO: Tachycardic rate, well-perfused, normal S1 and S2 PULM:  CTAB no wheezing or rhonchi GI/GU:  non-distended, non-tender MSK/SPINE:  No gross deformities, no edema SKIN:  no rash, atraumatic   *Additional and/or pertinent findings included in MDM below  Diagnostic and Interventional Summary    EKG Interpretation  Date/Time:    Ventricular Rate:    PR Interval:    QRS Duration:   QT Interval:    QTC Calculation:   R Axis:     Text Interpretation:         Labs Reviewed - No data to display  No orders to display    Medications  LORazepam (ATIVAN) tablet 1 mg (1 mg Oral Given 09/23/21 0431)  Procedures  /  Critical Care Procedures  ED Course and Medical Decision Making  Initial Impression and Ddx Patient presenting with request for rehab placement.  Explained that this is not typically done directly from the emergency department, happy to provide resources.  Patient is mildly tachycardic and hypertensive, will provide Ativan, oral fluids, reassess.  Doubt emergent process given his general lack of complaints.  Past medical/surgical history that increases complexity of ED encounter: History of drug dependence  Interpretation of Diagnostics Laboratory and/or imaging options to aid in the diagnosis/care of the patient were considered.  After careful history and physical examination, it was determined that there was no indication for diagnostics at this time.  Patient Reassessment and Ultimate Disposition/Management     When patient was informed that it was time to be  discharged with resources he began me about how he has been having some suicidal thoughts since the last time we talked.  He has had a recent TTS evaluation and was felt to be low risk and appropriate for outpatient management.  Overall this seems like malingering.  Very low concern that he would harm himself or others, appropriate for discharge.  Patient management required discussion with the following services or consulting groups:  None  Complexity of Problems Addressed Acute complicated illness or Injury  Additional Data Reviewed and Analyzed Further history obtained from: Recent Consult notes  Additional Factors Impacting ED Encounter Risk SDOH impact on management  Elmer Sow. Pilar Plate, MD [facility] Emergency Medicine [facility] [email or web address]  Final Clinical Impressions(s) / ED Diagnoses     ICD-10-CM   1. Cocaine dependence with cocaine-induced disorder (HCC)  F14.29       ED Discharge Orders     None        Discharge Instructions Discussed with and Provided to Patient:     Discharge Instructions      You were evaluated in the Emergency Department and after careful evaluation, we did not find any emergent condition requiring admission or further testing in the hospital.  Your exam/testing today is overall reassuring.  Recommend following up with the resources provided.  Please return to the Emergency Department if you experience any worsening of your condition.   Thank you for allowing [place] to be a part of your care.       Sabas Sous, MD 09/23/21 [phone]

## 2021-09-23 NOTE — Progress Notes (Signed)
CSW provided the following resources via AVS, for the patient to utilize to obtain substance use treatment. It should be noted that CSW has contacted Enrique Sack with [facility] in [place] who advised that there are 23 hour observations beds available for the patient to present for admission. It was advised that the patient should bring a 7 day supply of medication when presenting. Further, CSW with provider who notes speaking with the girlfriend of the the patient, who gets off work today around 6:00pm and can bring the patient's medications to take to treatment. Resources information provided to the patient.   Substance Abuse Resources   [facility] - Admissions are currently completed Monday through Friday at 8am; both appointments and walk-ins are accepted.  Any individual that is a [facility] resident may present for a substance abuse screening and assessment for admission.  A person may be referred by numerous sources or self-refer.   Potential clients will be screened for medical necessity and appropriateness for the program.  Clients must meet criteria for high-intensity residential treatment services.  If clinically appropriate, a client will continue with the comprehensive clinical assessment and intake process, as well as enrollment in the [facility].  Address: [address] [place], [place] [address] Admin Hours: Mon-Fri 8AM to [facility] Center Hours: 24/7 Phone: [phone] Fax: [phone]  [facility] (Detox) Facility Based Crisis:  These are 3 locations for services for week and weekend: Please call before arrival:    [facility] Recovery Facility Based Crisis [facility])  Address: [address] [place], [place] [address] Phone: [phone]  [facility] Recovery Facility Based Crisis [facility]) Address: [address] [place], [place] [address] Phone#: [phone]  [facility] Recovery Facility Based Crisis [facility]) Address: [address] [place], [place] [address] Phone#: [phone]   [facility]): (offers outpatient therapy and intensive outpatient substance abuse therapy).  [address], [place], [place] [address] Phone: [phone]   Phone: [phone]  [facility] Intensive Outpatient Program [facility]) means structured individual and group addiction activities and services that are provided at an outpatient program designed to assist adult and adolescent consumers to begin recovery and learn skills for recovery maintenance. The [facility] SAIOP program is offered at least 3 hours a day, 3 days a week. SAIOP services shall include a structured program consisting of, but not limited to, the following services: Individual counseling and support; Group counseling and support; Family counseling, training or support; Biochemical assays to identify recent drug use (e.g., urine drug screens); Strategies for relapse prevention to include community and social support systems in treatment; Life skills; Crisis contingency planning; Disease Management; and Treatment support activities that have been adapted or specifically designed for persons with physical disabilities, or persons with co-occurring disorders of mental illness and substance abuse/dependence or mental retardation/developmental disability and substance abuse/dependence.  Phone: [phone]   [facility])  Address: [address] [place], [place], [place] [address] Phone: [phone]   [facility] Address: [address], [place], [place] [address] Phone: [phone]  - a combination of group and individual sessions to meet the participants needs. This allows participants to engage in treatment and remain involved in their home and work life. - Transitional housing places program participants in a supportive living environment while they complete a treatment program and  work to secure independent housing. - The Substance Abuse Intensive Outpatient Treatment Program  at [employer] consists of structured group sessions and individual sessions that are designed to teach participants early recovery and relapse prevention skills. -[facility] works with the [employer] to provide a housing and treatment program for homeless veterans.   [facility] [employer].   Address: [address] [place], [place] [address] Phone#: [phone]   : Referrals to RTSA facilities can be made by [employer] and [facility].  Referrals are also accepted from physicians, private providers, hospital emergency rooms, family members, or any person who has knowledge of someone in the need of our services.  The [facility] will also offer the following outpatient services: (Monday through Friday 8am-5pm)   Partial Hospitalization Program (PHP) Substance Abuse Intensive Outpatient Program (SA-IOP) Group Therapy Medication Management Peer Living Room We also provide (24/7):  Assessments: Our mental health clinician and providers will conduct a focused mental health evaluation, assessing for immediate safety concerns and further mental health needs. Referral: Our team will provide resources and help connect to community based mental health treatment, when indicated, including psychotherapy, psychiatry, and other specialized behavioral health or substance use disorder services (for those not already in treatment). Transitional Care: Our team providers in person bridging and/or telephonic follow-up during the patient's transition to outpatient services.   [facility] 24-Hour Call Center: [phone] Behavioral Health Crisis Line: [phone]  Crissie Reese, MSW, Lenice Pressman Phone: [phone] Disposition/TOC

## 2021-09-23 NOTE — ED Notes (Signed)
Resting in the bed with no distress, sleeping. No distress noted. Denies SI/HI, denies AVH. Ate lunch.
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
## 2021-09-23 NOTE — ED Triage Notes (Addendum)
Pt states he is here to be sent for drug rehab for crack cocaine. Pt states he last smoked crack about 4 hrs PTA.

## 2021-09-24 LAB — GLUCOSE, CAPILLARY: Glucose-Capillary: 216 mg/dL — ABNORMAL HIGH (ref 70–99)

## 2021-09-24 MED ORDER — INSULIN GLARGINE 100 UNIT/ML ~~LOC~~ SOLN: 50.0000 [IU] | Freq: Every day | SUBCUTANEOUS | 1 refills | Status: DC

## 2021-09-24 MED ORDER — AMLODIPINE BESYLATE 5 MG PO TABS
5.0000 mg | ORAL_TABLET | Freq: Every day | ORAL | Status: DC
  Administered 2021-09-24: 5 mg via ORAL
  Filled 2021-09-24: qty 1
  Filled 2021-09-24: qty 14

## 2021-09-24 MED ORDER — AMLODIPINE BESYLATE 5 MG PO TABS: 5.0000 mg | ORAL_TABLET | Freq: Every day | ORAL | 1 refills | Status: DC

## 2021-09-24 MED ORDER — HYDROXYZINE HCL 25 MG PO TABS: 25.0000 mg | ORAL_TABLET | Freq: Three times a day (TID) | ORAL | 1 refills | Status: DC | PRN

## 2021-09-24 MED ORDER — HYDROCHLOROTHIAZIDE 12.5 MG PO TABS
12.5000 mg | ORAL_TABLET | Freq: Every day | ORAL | 1 refills | Status: DC
Start: 2021-09-24 — End: 2021-09-25

## 2021-09-24 MED ORDER — TRAZODONE HCL 50 MG PO TABS: 50.0000 mg | ORAL_TABLET | Freq: Every evening | ORAL | 1 refills | Status: DC | PRN

## 2021-09-24 MED ORDER — HYDROXYZINE HCL 25 MG PO TABS
25.0000 mg | ORAL_TABLET | Freq: Three times a day (TID) | ORAL | Status: DC | PRN
Start: 2021-09-25 — End: 2021-09-24
  Filled 2021-09-24: qty 10

## 2021-09-24 MED ORDER — HYDROCHLOROTHIAZIDE 12.5 MG PO TABS
12.5000 mg | ORAL_TABLET | Freq: Every day | ORAL | Status: DC
  Administered 2021-09-24: 12.5 mg via ORAL
  Filled 2021-09-24: qty 14
  Filled 2021-09-24: qty 1

## 2021-09-24 NOTE — ED Notes (Signed)
Pt sleeping@this time. Breathing even and unlabored. Will continue to monitor for safety 

## 2021-09-24 NOTE — ED Provider Notes (Signed)
FBC/OBS ASAP Discharge Summary  Date and Time: 09/24/2021 4:55 PM  Name: Troy Gordon  MRN:  [id number]   Discharge Diagnoses:  Final diagnoses:  Cocaine abuse [facility])    Subjective: Troy Gordon 56 y.o., male patient who initially presented to [facility] on 09/23/2021 he was evaluated and later transferred to [facility] facility for potential admission for residential treatment.  However upon arrival there were no available male beds.  He was then transported back to [facility] and at that time was transferred to the [facility].    Troy Gordon, 56 y.o., male patient seen face to face by this provider, consulted with Dr. Lucianne Muss; and chart reviewed on 09/24/21.    Patient is seeking residential substance abuse treatment.  Social worker contacted [facility] and patient has been accepted and he is to arrive today 09/24/2021 by 9 AM.  He will need a 14-day supply of his medications with prescriptions that include a 1 month refill.  He will be transferred via taxi to [facility].  During evaluation Troy Gordon is laying in his bed awake. He is alert, oriented x 4, calm, cooperative and attentive.  He has normal speech and behavior.  He denies depression at this time.  He endorses some anxiety.  Reports he slept well on the unit and he denies any concerns with his appetite Objectively there is no evidence of psychosis/mania or delusional thinking.  Patient is able to converse coherently, goal directed thoughts, no distractibility, or pre-occupation.  He also denies suicidal/self-harm/homicidal ideation, AVH, psychosis, and paranoia.  Patient answered question appropriately.     Stay Summary:   Patient was admitted to the continuous assessment unit while awaiting placement with [facility] residential.  He will be discharged with 14-day supply of Norvasc 5 mg daily, hydrochlorothiazide 12.5 mg daily, trazodone 50 mg nightly as needed, hydroxyzine 25 mg 3 times daily as needed and Lantus 50 units  daily.  He remained calm and cooperative while on the unit.  He interacted appropriately with staff and other patients.  He was compliant with medications.  Total Time spent with patient: 30 minutes  Past Psychiatric History: See H&P Past Medical History:  Past Medical History:  Diagnosis Date   Depression    Diabetes mellitus (HCC)    Dyslipidemia    Hyperlipidemia    Hypertension     Past Surgical History:  Procedure Laterality Date   CIRCUMCISION     as an adult   Family History:  Family History  Problem Relation Age of Onset   Diabetes Maternal Grandmother    Hypertension Maternal Grandmother    Hypertension Father    Family Psychiatric History: See H&P Social History:  Social History   Substance and Sexual Activity  Alcohol Use Not Currently   Comment: occasionally beer     Social History   Substance and Sexual Activity  Drug Use Yes   Types: Marijuana, Cocaine   Comment: denies use 08/05/21    Social History   Socioeconomic History   Marital status: Single    Spouse name: Not on file   Number of children: Not on file   Years of education: Not on file   Highest education level: Not on file  Occupational History   Not on file  Tobacco Use   Smoking status: Former    Years: 1.00    Types: Cigarettes   Smokeless tobacco: Never   Tobacco comments:    1 cig every 5 weeks  Vaping Use   Vaping Use: Never used  Substance and Sexual Activity   Alcohol use: Not Currently    Comment: occasionally beer   Drug use: Yes    Types: Marijuana, Cocaine    Comment: denies use 08/05/21   Sexual activity: Not on file  Other Topics Concern   Not on file  Social History Narrative   Not on file   Social Determinants of Health   Financial Resource Strain: Not on file  Food Insecurity: Not on file  Transportation Needs: Not on file  Physical Activity: Not on file  Stress: Not on file  Social Connections: Not on file   SDOH:  SDOH Screenings   Alcohol  Screen: Low Risk  (03/12/2021)   Alcohol Screen    Last Alcohol Screening Score (AUDIT): 3  Depression (PHQ2-9): Medium Risk (09/23/2021)   Depression (PHQ2-9)    PHQ-2 Score: 8  Financial Resource Strain: Not on file  Food Insecurity: Not on file  Housing: Not on file  Physical Activity: Not on file  Social Connections: Not on file  Stress: Not on file  Tobacco Use: Medium Risk (09/23/2021)   Patient History    Smoking Tobacco Use: Former    Smokeless Tobacco Use: Never    Passive Exposure: Not on file  Transportation Needs: Not on file    Tobacco Cessation:  N/A, patient does not currently use tobacco products  Current Medications:  No current facility-administered medications for this encounter.   Current Outpatient Medications  Medication Sig Dispense Refill   hydrOXYzine (ATARAX) 25 MG tablet Take 1 tablet (25 mg total) by mouth 3 (three) times daily as needed. 90 tablet 1   amLODipine (NORVASC) 5 MG tablet Take 1 tablet (5 mg total) by mouth daily. 30 tablet 1   hydrochlorothiazide (HYDRODIURIL) 12.5 MG tablet Take 1 tablet (12.5 mg total) by mouth daily. 30 tablet 1   insulin glargine (LANTUS) 100 UNIT/ML injection Inject 0.5 mLs (50 Units total) into the skin daily. For diabetes management 10 mL 1   traZODone (DESYREL) 50 MG tablet Take 1 tablet (50 mg total) by mouth at bedtime as needed for sleep. 30 tablet 1    PTA Medications: (Not in a hospital admission)      09/23/2021    2:59 PM 05/31/2021   12:22 AM 06/08/2020    2:16 AM  Depression screen PHQ 2/9  Decreased Interest 1 2 1   Down, Depressed, Hopeless 2 3 2   PHQ - 2 Score 3 5 3   Altered sleeping 1 2 1   Tired, decreased energy 1 1 0  Change in appetite 0 0 0  Feeling bad or failure about yourself  1 2 1   Trouble concentrating 1 1 0  Moving slowly or fidgety/restless 0 0 0  Suicidal thoughts 1 2 2   PHQ-9 Score 8 13 7   Difficult doing work/chores Somewhat difficult Extremely dIfficult Very difficult     Flowsheet Row ED from 09/23/2021 in [facility] Most recent reading at 09/23/2021  4:05 PM ED from 09/23/2021 in [facility] EMERGENCY DEPARTMENT Most recent reading at 09/23/2021  4:00 AM ED from 09/03/2021 in [facility] EMERGENCY DEPARTMENT Most recent reading at 09/03/2021  7:29 AM  C-SSRS RISK CATEGORY No Risk No Risk Low Risk       Musculoskeletal  Strength & Muscle Tone: within normal limits Gait & Station: normal Patient leans: N/A  Psychiatric Specialty Exam  Presentation  General Appearance: Appropriate for Environment; Casual  Eye  Contact:Good  Speech:Clear and Coherent; Normal Rate  Speech Volume:Normal  Handedness:Right   Mood and Affect  Mood:Anxious  Affect:Congruent   Thought Process  Thought Processes:Coherent  Descriptions of Associations:Intact  Orientation:Full (Time, Place and Person)  Thought Content:Logical  Diagnosis of Schizophrenia or Schizoaffective disorder in past: No    Hallucinations:Hallucinations: None  Ideas of Reference:None  Suicidal Thoughts:Suicidal Thoughts: No  Homicidal Thoughts:Homicidal Thoughts: No   Sensorium  Memory:Immediate Good; Recent Good; Remote Good  Judgment:Fair  Insight:Fair   Executive Functions  Concentration:Good  Attention Span:Good  Recall:Good  Fund of Knowledge:Good  Language:Good   Psychomotor Activity  Psychomotor Activity:Psychomotor Activity: Normal AIMS Completed?: No   Assets  Assets:Communication Skills; Desire for Improvement; Financial Resources/Insurance; Leisure Time; Physical Health; Resilience   Sleep  Sleep:Sleep: Good   Nutritional Assessment (For OBS and FBC admissions only) Has the patient had a weight loss or gain of 10 pounds or more in the last 3 months?: No Has the patient had a decrease in food intake/or appetite?: No Does the patient have dental problems?: No Does the patient have eating habits or behaviors that may  be indicators of an eating disorder including binging or inducing vomiting?: No Has the patient recently lost weight without trying?: 0 Has the patient been eating poorly because of a decreased appetite?: 0 Malnutrition Screening Tool Score: 0    Physical Exam  Physical Exam Vitals and nursing note reviewed.  Constitutional:      General: He is not in acute distress.    Appearance: Normal appearance. He is well-developed. He is not ill-appearing.  HENT:     Head: Normocephalic and atraumatic.  Eyes:     General:        Right eye: No discharge.        Left eye: No discharge.     Conjunctiva/sclera: Conjunctivae normal.  Cardiovascular:     Rate and Rhythm: Normal rate.  Pulmonary:     Effort: Pulmonary effort is normal. No respiratory distress.  Abdominal:     Tenderness: There is no abdominal tenderness.  Musculoskeletal:        General: Normal range of motion.     Cervical back: Normal range of motion.  Skin:    General: Skin is warm.     Capillary Refill: Capillary refill takes less than 2 seconds.     Coloration: Skin is not jaundiced or pale.  Neurological:     Mental Status: He is alert and oriented to person, place, and time.  Psychiatric:        Attention and Perception: Attention and perception normal.        Mood and Affect: Mood is anxious.        Speech: Speech normal.        Behavior: Behavior normal. Behavior is cooperative.        Thought Content: Thought content normal.        Cognition and Memory: Cognition normal.        Judgment: Judgment normal.    Review of Systems  Constitutional: Negative.   HENT: Negative.    Eyes: Negative.   Respiratory: Negative.    Cardiovascular: Negative.   Musculoskeletal: Negative.   Skin: Negative.   Neurological: Negative.   Psychiatric/Behavioral:  The patient is nervous/anxious.    Blood pressure 131/81, pulse 63, temperature 97.8 F (36.6 C), temperature source Oral, resp. rate 18, SpO2 100 %. There is no  height or weight on file to calculate BMI.  Demographic Factors:  Male, Low socioeconomic status, Living alone, and Unemployed  Loss Factors: Financial problems/change in socioeconomic status  Historical Factors: Impulsivity  Risk Reduction Factors:   Sense of responsibility to family, Positive social support, Positive therapeutic relationship, and Positive coping skills or problem solving skills  Continued Clinical Symptoms:  Severe Anxiety and/or Agitation Alcohol/Substance Abuse/Dependencies  Cognitive Features That Contribute To Risk:  None    Suicide Risk:  Minimal: No identifiable suicidal ideation.  Patients presenting with no risk factors but with morbid ruminations; may be classified as minimal risk based on the severity of the depressive symptoms  Plan Of Care/Follow-up recommendations:  Activity:  as tolerated  Diet:  regular   Disposition:   Discharge patient.  Patient will be transferred via taxi to [facility] for residential treatment he has an appointment at 9 AM.  No evidence of imminent risk to self or others at present.    Patient does not meet criteria for psychiatric inpatient admission. Discussed crisis plan, support from social network, calling 911, coming to the Emergency Department, and calling Suicide Hotline.   Ardis Hughs, NP 09/24/2021, 4:55 PM

## 2021-09-24 NOTE — ED Notes (Signed)
Patient was discharged to [facility] in [place]. Patient was escorted and transported by [employer]. Patient was in stable condition prior to leaving and had all of belongings including his medical supplies. Patient was given his discharge summary.

## 2021-09-24 NOTE — ED Notes (Signed)
Pt resting quietly, denies pain, SI, Hi, and AVH. Will be transferring to [facility] in [place] this morning.   No pain or discomfort noted/voiced.  Breathing is even and unlabored.  Will continue to monitor for safety.

## 2021-09-25 ENCOUNTER — Ambulatory Visit: Payer: Medicaid Other | Admitting: Physician Assistant

## 2021-09-25 ENCOUNTER — Encounter: Payer: Self-pay | Admitting: Physician Assistant

## 2021-09-25 VITALS — BP 172/96 | HR 91 | Temp 98.0°F

## 2021-09-25 DIAGNOSIS — I10 Essential (primary) hypertension: Secondary | ICD-10-CM

## 2021-09-25 DIAGNOSIS — E785 Hyperlipidemia, unspecified: Secondary | ICD-10-CM

## 2021-09-25 DIAGNOSIS — E1165 Type 2 diabetes mellitus with hyperglycemia: Secondary | ICD-10-CM

## 2021-09-25 DIAGNOSIS — N189 Chronic kidney disease, unspecified: Secondary | ICD-10-CM

## 2021-09-25 DIAGNOSIS — F489 Nonpsychotic mental disorder, unspecified: Secondary | ICD-10-CM

## 2021-09-25 DIAGNOSIS — F1410 Cocaine abuse, uncomplicated: Secondary | ICD-10-CM

## 2021-09-25 LAB — GLUCOSE, CAPILLARY: Glucose-Capillary: 223 mg/dL — ABNORMAL HIGH (ref 70–99)

## 2021-09-25 MED ORDER — HYDROCHLOROTHIAZIDE 25 MG PO TABS: 25.0000 mg | ORAL_TABLET | Freq: Every day | ORAL | 1 refills | Status: DC

## 2021-09-25 MED ORDER — LISINOPRIL 20 MG PO TABS: 20.0000 mg | ORAL_TABLET | Freq: Every day | ORAL | 1 refills | Status: DC

## 2021-09-25 MED ORDER — AMLODIPINE BESYLATE 10 MG PO TABS: 10.0000 mg | ORAL_TABLET | Freq: Every day | ORAL | 1 refills | Status: DC

## 2021-09-25 MED ORDER — ATORVASTATIN CALCIUM 80 MG PO TABS: 80.0000 mg | ORAL_TABLET | Freq: Every day | ORAL | 1 refills | Status: DC

## 2021-09-25 NOTE — Progress Notes (Signed)
BP (!) 172/96   Pulse 91   Temp 98 F (36.7 C)   SpO2 98%    Subjective:    Patient ID: Troy Gordon, male    DOB: March 24, 1965, 56 y.o.   MRN: [id number]  HPI: Troy Gordon is a 56 y.o. male presenting on 09/25/2021 for No chief complaint on file.   HPI   Pt in today for follow up htn, dm, dyslipidemia.  Pt was last seen in office 08/28/21.  He cancelled his follow up appointment on 09/18/21.    He has been seen in ER several times since his last appointment here with SI and cocaine use.   Those notes were reviewed.  Pt says he got all the way to [place] (where he was supposed to be admitted yesterday) but they wouldn't let him check in  because he needed 2 weeks of his meds.  He says he is going back on Wednesday.  He says he has been out of all his meds / they ran out over the weekend.    He says his daughter lost a baby.  It makes him sad that she's in [place] and he isn't.  He doesn't want to move back to [place] due to he has friends there that are bad influence.    Meds In June/at June 19 appt: Norvasc 10 Atorvastatin 80 Clonidine 0.1 bid Hctz 25 Lantus 50 Lisinopril 20    Relevant past medical, surgical, family and social history reviewed and updated as indicated. Interim medical history since our last visit reviewed. Allergies and medications reviewed and updated.   CURRENT MEDS: none  Review of Systems  Per HPI unless specifically indicated above     Objective:    BP (!) 172/96   Pulse 91   Temp 98 F (36.7 C)   SpO2 98%   Wt Readings from Last 3 Encounters:  09/23/21 201 lb (91.2 kg)  09/03/21 201 lb (91.2 kg)  08/05/21 201 lb (91.2 kg)    Physical Exam Vitals reviewed.  Constitutional:      General: He is not in acute distress.    Appearance: He is not toxic-appearing.  HENT:     Head: Normocephalic and atraumatic.  Cardiovascular:     Rate and Rhythm: Normal rate and regular rhythm.  Pulmonary:     Effort: Pulmonary effort is  normal.     Breath sounds: Normal breath sounds. No wheezing.  Abdominal:     General: Bowel sounds are normal.     Palpations: Abdomen is soft.     Tenderness: There is no abdominal tenderness.  Musculoskeletal:     Cervical back: Neck supple.     Right lower leg: No edema.     Left lower leg: No edema.  Lymphadenopathy:     Cervical: No cervical adenopathy.  Skin:    General: Skin is warm and dry.  Neurological:     Mental Status: He is alert and oriented to person, place, and time.  Psychiatric:        Attention and Perception: Attention normal.        Behavior: Behavior normal. Behavior is cooperative.           Assessment & Plan:    Encounter Diagnoses  Name Primary?   Uncontrolled type 2 diabetes mellitus with hyperglycemia (HCC) Yes   Primary hypertension    Hyperlipidemia, unspecified hyperlipidemia type    Chronic kidney disease, unspecified CKD stage    Mental health problem  Cocaine abuse [facility])      -Discussed with pt that he needs Renal US and referral to nephrologist when he gets back to town -pt is given rx for meds he was on at June appointment.  Pt is happy with this -pt is scheduled to RTO in 6weeks.  He is to come in sooner if he gets back in town sooner;  he is planning to stay at inpatient treatment facility starting on Wednesday for 6 weeks

## 2021-09-29 ENCOUNTER — Telehealth: Payer: Self-pay

## 2021-09-29 NOTE — Telephone Encounter (Signed)
Called to follow up with client, noted he has had some more ER visits since last attempted contacts. Last attempt was 09/14/21 by phone and was no answer. Today client answered, but per client he is at work and cannot talk until 4:30 PM. Discussed that I will not be in the office today at 4:30 PM. We discussed follow up on Monday, he states he will call me around 4:30 pm that day, will plan to call him at that time if I have not received a call.   Will continue to follow up with client.  Plan: discuss with client if he is going to [facility] or other outpatient MH Services. Refer to Tobe Sos MSW intern for help with continued outreach and connection to services needed.  Francee Nodal RN [employer]

## 2021-10-10 ENCOUNTER — Telehealth: Payer: Self-pay

## 2021-10-10 NOTE — Telephone Encounter (Signed)
Returned call to client. Client expresses concern and would like to change providers. He would like a referral to [facility]. Discussed that I will be unsure of his copayment. I will send paperwork to [facility] and they will contact him regarding amount of copayment and make his next appointment. He asked that I please contact [facility] and let them know and to cancel his appointment in August.  Discussed with client regarding Mental Health, He has has several ER visits regarding substance use and depression and SI. Client states he has not reconnected at [facility] and is aware he may have to redo "intake" we discussed that [facility] has outpatient treatment he may access as well as treatment for his depression. He states he started a new job and did not go to [employer] as he had discussed with [employer]. He states he wants to think about things. Plan: will have Tobe Sos MSW intern call him next week for follow up regarding Mental health services. He states he has diabetes testing supplies and has not gone to the [facility] in a couple weeks but plans on going back. Will continue to follow up with client. Will send enrollment packet to [facility] department as client requests.   Francee Nodal RN [employer]

## 2021-10-20 ENCOUNTER — Encounter (HOSPITAL_COMMUNITY): Payer: Self-pay | Admitting: Emergency Medicine

## 2021-10-20 ENCOUNTER — Other Ambulatory Visit: Payer: Self-pay

## 2021-10-20 ENCOUNTER — Emergency Department (HOSPITAL_COMMUNITY)
Admission: EM | Admit: 2021-10-20 | Discharge: 2021-10-20 | Disposition: A | Discharge status: Home / Self Care | Payer: Self-pay | Attending: Emergency Medicine | Admitting: Emergency Medicine

## 2021-10-20 ENCOUNTER — Emergency Department (HOSPITAL_COMMUNITY): Payer: Self-pay

## 2021-10-20 DIAGNOSIS — M25562 Pain in left knee: Secondary | ICD-10-CM | POA: Insufficient documentation

## 2021-10-20 DIAGNOSIS — Z794 Long term (current) use of insulin: Secondary | ICD-10-CM | POA: Insufficient documentation

## 2021-10-20 MED ORDER — KETOROLAC TROMETHAMINE 15 MG/ML IJ SOLN
15.0000 mg | Freq: Once | INTRAMUSCULAR | Status: AC
  Administered 2021-10-20: 15 mg via INTRAMUSCULAR
  Filled 2021-10-20: qty 1

## 2021-10-20 MED ORDER — DICLOFENAC SODIUM 1 % EX GEL: 4.0000 g | Freq: Four times a day (QID) | CUTANEOUS | 0 refills | Status: DC

## 2021-10-20 MED ORDER — OXYCODONE-ACETAMINOPHEN 5-325 MG PO TABS
2.0000 | ORAL_TABLET | Freq: Once | ORAL | Status: AC
  Administered 2021-10-20: 2 via ORAL
  Filled 2021-10-20: qty 2

## 2021-10-20 NOTE — ED Triage Notes (Signed)
Pt presents with left knee pain, injured knee on treadmill on yesterday while doing squats.

## 2021-10-20 NOTE — Discharge Instructions (Signed)
Please take 600 mg ibuprofen every 6 hours as needed for left knee pain.  This is likely an overuse type injury.  This will resolve on its own with time.  In the event that it does not, I provided an orthopedist that you can contact.  Use knee sleeve for comfort.  Please return to the emergency room if any worsening symptoms you might have.

## 2021-10-20 NOTE — ED Provider Notes (Signed)
[facility] EMERGENCY DEPARTMENT Provider Note   CSN: [id number] Arrival date & time: 10/20/21  1726     History Chief Complaint  Patient presents with   Knee Injury    Troy Gordon is a 56 y.o. male patient who presents to the emergency department for further evaluation of left knee pain.  Patient states that he has been going back into the gym and doing a lot of movement on the treadmill in addition to squats.  Yesterday, he states that he felt that he overdid it.  He did not feel any popping sensation or tearing sensation.  He states that his knee has been sore primarily over the left patella.  Patient walking with a limp secondary to pain.  Denies any focal weakness or numbness to the leg. No trauma.   HPI     Home Medications Prior to Admission medications   Medication Sig Start Date End Date Taking? Authorizing Provider  diclofenac Sodium (VOLTAREN) 1 % GEL Apply 4 g topically 4 (four) times daily. 10/20/21  Yes Meredeth Ide, Jerrica Thorman M, PA-C  amLODipine (NORVASC) 10 MG tablet Take 1 tablet (10 mg total) by mouth daily. 09/25/21   [name], PA-C  atorvastatin (LIPITOR) 80 MG tablet Take 1 tablet (80 mg total) by mouth daily. 09/25/21   [name], PA-C  hydrochlorothiazide (HYDRODIURIL) 25 MG tablet Take 1 tablet (25 mg total) by mouth daily. 09/25/21   [name], PA-C  hydrOXYzine (ATARAX) 25 MG tablet Take 1 tablet (25 mg total) by mouth 3 (three) times daily as needed. 09/24/21   Ardis Hughs, NP  insulin glargine (LANTUS) 100 UNIT/ML injection Inject 0.5 mLs (50 Units total) into the skin daily. For diabetes management 09/24/21   Ardis Hughs, NP  lisinopril (ZESTRIL) 20 MG tablet Take 1 tablet (20 mg total) by mouth daily. 09/25/21   [name], PA-C  traZODone (DESYREL) 50 MG tablet Take 1 tablet (50 mg total) by mouth at bedtime as needed for sleep. 09/24/21   Ardis Hughs, NP      Allergies    Patient has no known allergies.     Review of Systems   Review of Systems  All other systems reviewed and are negative.   Physical Exam Updated Vital Signs BP (!) 171/84 (BP Location: Right Arm)   Pulse 100   Temp 98.2 F (36.8 C) (Oral)   Resp 18   Ht 5\' 8"  (1.727 m)   Wt 88.7 kg   SpO2 99%   BMI 29.74 kg/m  Physical Exam Vitals and nursing note reviewed.  Constitutional:      Appearance: Normal appearance.  HENT:     Head: Normocephalic and atraumatic.  Eyes:     General:        Right eye: No discharge.        Left eye: No discharge.     Conjunctiva/sclera: Conjunctivae normal.  Pulmonary:     Effort: Pulmonary effort is normal.  Musculoskeletal:     Comments: Left knee has point tenderness over the patellar ligament.  No knee effusion.  Negative valgus and varus stress testing.  Negative anterior drawer.  Difficulty with range of motion secondary to pain.  Neurovascular intact distal to the knee.  Skin:    General: Skin is warm and dry.     Findings: No rash.  Neurological:     General: No focal deficit present.     Mental Status: He is alert.  Psychiatric:  Mood and Affect: Mood normal.        Behavior: Behavior normal.     ED Results / Procedures / Treatments   Labs (all labs ordered are listed, but only abnormal results are displayed) Labs Reviewed - No data to display  EKG None  Radiology DG Knee 2 Views Left  Result Date: 10/20/2021 CLINICAL DATA:  Left-sided knee pain EXAM: LEFT KNEE - 2 VIEW COMPARISON:  None Available. FINDINGS: No evidence of fracture, dislocation, or joint effusion. No evidence of arthropathy or other focal bone abnormality. Soft tissues are unremarkable. IMPRESSION: No acute abnormality noted. Electronically Signed   By: Alcide Clever M.D.   On: 10/20/2021 19:05    Procedures Procedures    Medications Ordered in ED Medications  ketorolac (TORADOL) 15 MG/ML injection 15 mg (has no administration in time range)  oxyCODONE-acetaminophen  (PERCOCET/ROXICET) 5-325 MG per tablet 2 tablet (has no administration in time range)    ED Course/ Medical Decision Making/ A&P                           Medical Decision Making Troy Gordon is a 56 y.o. male patient who presents to the emergency department for further evaluation evaluation of left knee pain.  Given the mechanism injury we will evaluate with x-ray of the left knee.  I suspect this is likely an overuse type injury given that he recently started going back to exercise after taking hiatus.  We will plan to reassess.  I personally ordered interpreted x-ray of the left knee which does not show any evidence of fracture or dislocation.  No evidence of effusion.  Suspect this is likely an overuse type injury.  I will provide him Toradol and Percocet.  I discussed conservative measures with him at the bedside.  I will provide him with a knee brace for support.  I will also give him an orthopedist if this does not get better.  He is safe for discharge at this time.  Strict return precautions discussed.   Amount and/or Complexity of Data Reviewed Radiology: ordered and independent interpretation performed.  Risk Prescription drug management.   Final Clinical Impression(s) / ED Diagnoses Final diagnoses:  Acute pain of left knee    Rx / DC Orders ED Discharge Orders          Ordered    diclofenac Sodium (VOLTAREN) 1 % GEL  4 times daily        10/20/21 2202              Teressa Lower, PA-C 10/20/21 2211    Phoebe Sharps, DO 10/21/21 0021

## 2021-10-24 ENCOUNTER — Telehealth: Payer: Self-pay

## 2021-10-24 NOTE — Telephone Encounter (Signed)
Attempted to call client for follow up. No answer, left Voicemail requesting return call.   Francee Nodal RN [employer]

## 2021-10-25 ENCOUNTER — Telehealth: Payer: Self-pay

## 2021-10-25 NOTE — Telephone Encounter (Signed)
Client returned call. He states he is to start his new job at [employer] next week.   He has not heard from [facility] regarding an appointment to transfer care.   He also had a recent ER visit due to spraining his ankle. He states he only went because he needed a note stating he could return to work.  He is excited about his new job through [employer] and states if he can work 200 hours he could be placed on permanently.   He asked about transportation if he needs it to medical appointments. We did discuss that he can call us at least 2 business days ahead and as long as we have resources we would help. He states understanding.  Plan: Will email Larey Dresser at [facility] inquire about appointment for client. He is agreeable.  Francee Nodal RN [employer]

## 2021-11-08 ENCOUNTER — Ambulatory Visit: Payer: Medicaid Other | Admitting: Physician Assistant

## 2021-11-27 ENCOUNTER — Telehealth: Payer: Self-pay

## 2021-11-28 ENCOUNTER — Telehealth: Payer: Self-pay

## 2021-11-28 NOTE — Telephone Encounter (Signed)
Attempted follow up call. No answer, left message to return call.   Debria Garret RN Clara Gunn/Care connect

## 2021-11-28 NOTE — Telephone Encounter (Signed)
10:22 am 11/27/21 Returned call to care connect client who states he is very "down" and wants to be committed. He does admit to Crack Cocaine use last evening 11/26/21. He has not been to [facility] since at least January per his accounts.  [facility], they state that client would need to call [facility] in [place] at [phone] to ask for help and to see if they have a bed available for a 24 hour hold and help navigating any further placement, discussed with client that if they can take him we will arrange transportation with [facility]. (Called [facility] to confirm ability for [facility] transport and process). Client also given another option of calling Mobile [facility] at [phone] and discussed they would call him and come to his location if needed. He states the person allowing him to live there would not allow [facility] to come. Last option would be to go to [facility] ER for evaluation and assistance for services and discussed with client in great detail that if he becomes suicidal to call 911 immediately. He states understanding of all the above. He states he is currently SAFE and has no plan for Suicide attempt. Client states understanding.  Plan: will continue to follow up with client, he is agreeable to texts and calls.  11:30 am update: client reports he has been calling [facility] and no one is answering the phone, confirmed number to be correct. He will continue to call. Again client reports he is currently Safe with no current SI, but still wants help before he gets worse.  Reviewed all options as above and again emphasized that at any point he feels suicidal or in need of urgent help to call 911 to go to the ER. He states understanding. Will continue to follow up with client. He is agreeable.  12:30 attempted to call client and he did not answer, asked client to please return call ASAP. Also text messaged client .  3:00 PM Called client and he answered stating he had not called  anyone further as he fell asleep. He assures he is still SAFE with no current SI . He will try calling [facility] again and keep this RN posted in order to set up transportation as needed.  Also reminded client he can call the mobile crisis number provided. He declines wanting to call them as that would upset the person allowing him to live there and risk his housing. Discussed again at length option of 911 for help. He states understanding.  4:00PM client called this RN stating no one is answering at [facility], He is still saying " I'm safe, I just need help before I get worse" Client is going to call [facility] as they provided numbers for him to call and see if he has other options.  Denies current SI  4:30 PM client called back stating no one at [facility] aswered. We discussed all options for evaluation and possible treatment for his depression and recent substance use. Client reports he is going to go the [facility] , he states that if they can help place him in care for [facility] would care connect provide transportation if it is required for him to get there. Discussed that no one is in office during the night however we are back in the office by 8 am and would be able to help assist and provide transportation with [facility]. Client reports he would give our number to medical staff.  Plan : will follow up  with client on morning of 11/28/21 to determine plan. He is agreeable. He does contract he is going to the ER for assistance. He states he is extremely depressed and is wanting help and states he last used "crack" last evening. He denies current SI at this time. He is alert and oriented and able to articulate plan and details. Again discussed he can call 911 if he feels he needs immediate help. He states understanding.  Francee Nodal RN [employer]

## 2021-11-28 NOTE — Telephone Encounter (Signed)
Attempted call, to follow up, no answer, left message., also texted.  Troy Gordon

## 2021-12-01 ENCOUNTER — Encounter (HOSPITAL_COMMUNITY): Payer: Self-pay | Admitting: Emergency Medicine

## 2021-12-01 ENCOUNTER — Other Ambulatory Visit: Payer: Self-pay

## 2021-12-01 ENCOUNTER — Emergency Department (HOSPITAL_COMMUNITY)
Admission: EM | Admit: 2021-12-01 | Discharge: 2021-12-02 | Disposition: A | Discharge status: Other Acute Inpatient Hospital | Payer: No Payment, Other | Attending: Emergency Medicine | Admitting: Emergency Medicine

## 2021-12-01 DIAGNOSIS — F1420 Cocaine dependence, uncomplicated: Secondary | ICD-10-CM | POA: Diagnosis present

## 2021-12-01 DIAGNOSIS — Z20822 Contact with and (suspected) exposure to covid-19: Secondary | ICD-10-CM | POA: Insufficient documentation

## 2021-12-01 DIAGNOSIS — F1220 Cannabis dependence, uncomplicated: Secondary | ICD-10-CM | POA: Diagnosis present

## 2021-12-01 DIAGNOSIS — E119 Type 2 diabetes mellitus without complications: Secondary | ICD-10-CM | POA: Insufficient documentation

## 2021-12-01 DIAGNOSIS — F1994 Other psychoactive substance use, unspecified with psychoactive substance-induced mood disorder: Secondary | ICD-10-CM | POA: Diagnosis not present

## 2021-12-01 DIAGNOSIS — Z79899 Other long term (current) drug therapy: Secondary | ICD-10-CM | POA: Insufficient documentation

## 2021-12-01 DIAGNOSIS — Z87891 Personal history of nicotine dependence: Secondary | ICD-10-CM | POA: Insufficient documentation

## 2021-12-01 DIAGNOSIS — I10 Essential (primary) hypertension: Secondary | ICD-10-CM | POA: Insufficient documentation

## 2021-12-01 DIAGNOSIS — Z5900 Homelessness unspecified: Secondary | ICD-10-CM | POA: Insufficient documentation

## 2021-12-01 DIAGNOSIS — R45851 Suicidal ideations: Secondary | ICD-10-CM | POA: Insufficient documentation

## 2021-12-01 DIAGNOSIS — Z794 Long term (current) use of insulin: Secondary | ICD-10-CM | POA: Insufficient documentation

## 2021-12-01 DIAGNOSIS — F12988 Cannabis use, unspecified with other cannabis-induced disorder: Secondary | ICD-10-CM | POA: Insufficient documentation

## 2021-12-01 DIAGNOSIS — R45850 Homicidal ideations: Secondary | ICD-10-CM | POA: Insufficient documentation

## 2021-12-01 DIAGNOSIS — F1494 Cocaine use, unspecified with cocaine-induced mood disorder: Secondary | ICD-10-CM | POA: Insufficient documentation

## 2021-12-01 LAB — URINALYSIS, ROUTINE W REFLEX MICROSCOPIC
Bacteria, UA: NONE SEEN
Bilirubin Urine: NEGATIVE
Glucose, UA: >=500 mg/dL — AB
Hgb urine dipstick: NEGATIVE
Ketones, ur: NEGATIVE mg/dL
Leukocytes,Ua: NEGATIVE
Nitrite: NEGATIVE
Protein, ur: 100 mg/dL — AB
Specific Gravity, Urine: 1.03 (ref 1.005–1.030)
pH: 5 (ref 5.0–8.0)

## 2021-12-01 LAB — COMPREHENSIVE METABOLIC PANEL
ALT: 32 U/L (ref 0–44)
AST: 20 U/L (ref 15–41)
Albumin: 3.6 g/dL (ref 3.5–5.0)
Alkaline Phosphatase: 87 U/L (ref 38–126)
Anion gap: 7 (ref 5–15)
BUN: 20 mg/dL (ref 6–20)
CO2: 29 mmol/L (ref 22–32)
Calcium: 8.8 mg/dL — ABNORMAL LOW (ref 8.9–10.3)
Chloride: 103 mmol/L (ref 98–111)
Creatinine, Ser: 1.32 mg/dL — ABNORMAL HIGH (ref 0.61–1.24)
GFR, Estimated: >60 mL/min (ref >60)
Glucose, Bld: 297 mg/dL — ABNORMAL HIGH (ref 70–99)
Potassium: 3.7 mmol/L (ref 3.5–5.1)
Sodium: 139 mmol/L (ref 135–145)
Total Bilirubin: 0.6 mg/dL (ref 0.3–1.2)
Total Protein: 6.8 g/dL (ref 6.5–8.1)

## 2021-12-01 LAB — CBC WITH DIFFERENTIAL/PLATELET
Abs Immature Granulocytes: 0.02 10*3/uL (ref 0.00–0.07)
Basophils Absolute: 0 10*3/uL (ref 0.0–0.1)
Basophils Relative: 0 %
Eosinophils Absolute: 0.1 10*3/uL (ref 0.0–0.5)
Eosinophils Relative: 2 %
HCT: 42.6 % (ref 39.0–52.0)
Hemoglobin: 14.2 g/dL (ref 13.0–17.0)
Immature Granulocytes: 0 %
Lymphocytes Relative: 36 %
Lymphs Abs: 2.6 10*3/uL (ref 0.7–4.0)
MCH: 27.6 pg (ref 26.0–34.0)
MCHC: 33.3 g/dL (ref 30.0–36.0)
MCV: 82.9 fL (ref 80.0–100.0)
Monocytes Absolute: 0.4 10*3/uL (ref 0.1–1.0)
Monocytes Relative: 6 %
Neutro Abs: 3.9 10*3/uL (ref 1.7–7.7)
Neutrophils Relative %: 56 %
Platelets: 165 10*3/uL (ref 150–400)
RBC: 5.14 MIL/uL (ref 4.22–5.81)
RDW: 13.3 % (ref 11.5–15.5)
WBC: 7.1 10*3/uL (ref 4.0–10.5)
nRBC: 0 % (ref 0.0–0.2)

## 2021-12-01 LAB — ACETAMINOPHEN LEVEL: Acetaminophen (Tylenol), Serum: <10 ug/mL — ABNORMAL LOW (ref 10–30)

## 2021-12-01 LAB — RAPID URINE DRUG SCREEN, HOSP PERFORMED
Amphetamines: NOT DETECTED
Barbiturates: NOT DETECTED
Benzodiazepines: NOT DETECTED
Cocaine: POSITIVE — AB
Opiates: NOT DETECTED
Tetrahydrocannabinol: POSITIVE — AB

## 2021-12-01 LAB — RESP PANEL BY RT-PCR (FLU A&B, COVID) ARPGX2
Influenza A by PCR: NEGATIVE
Influenza B by PCR: NEGATIVE
SARS Coronavirus 2 by RT PCR: NEGATIVE

## 2021-12-01 LAB — SALICYLATE LEVEL: Salicylate Lvl: <7 mg/dL — ABNORMAL LOW (ref 7.0–30.0)

## 2021-12-01 LAB — ETHANOL: Alcohol, Ethyl (B): <10 mg/dL (ref <10)

## 2021-12-01 MED ORDER — LISINOPRIL 10 MG PO TABS
20.0000 mg | ORAL_TABLET | Freq: Every day | ORAL | Status: DC
  Administered 2021-12-01 – 2021-12-02 (×2): 20 mg via ORAL
  Filled 2021-12-01 (×2): qty 2

## 2021-12-01 MED ORDER — ZIPRASIDONE MESYLATE 20 MG IM SOLR: 20.0000 mg | INTRAMUSCULAR | Status: DC | PRN

## 2021-12-01 MED ORDER — ATORVASTATIN CALCIUM 40 MG PO TABS
80.0000 mg | ORAL_TABLET | Freq: Every day | ORAL | Status: DC
  Administered 2021-12-01 – 2021-12-02 (×2): 80 mg via ORAL
  Filled 2021-12-01 (×2): qty 2

## 2021-12-01 MED ORDER — HYDROCHLOROTHIAZIDE 25 MG PO TABS
25.0000 mg | ORAL_TABLET | Freq: Every day | ORAL | Status: DC
  Administered 2021-12-01 – 2021-12-02 (×2): 25 mg via ORAL
  Filled 2021-12-01 (×2): qty 1

## 2021-12-01 MED ORDER — AMLODIPINE BESYLATE 5 MG PO TABS
10.0000 mg | ORAL_TABLET | Freq: Every day | ORAL | Status: DC
  Administered 2021-12-01 – 2021-12-02 (×2): 10 mg via ORAL
  Filled 2021-12-01 (×2): qty 2

## 2021-12-01 MED ORDER — LORAZEPAM 1 MG PO TABS: 1.0000 mg | ORAL_TABLET | ORAL | Status: DC | PRN

## 2021-12-01 MED ORDER — TRAZODONE HCL 50 MG PO TABS: 50.0000 mg | ORAL_TABLET | Freq: Every evening | ORAL | Status: DC | PRN

## 2021-12-01 MED ORDER — OLANZAPINE 5 MG PO TBDP: 5.0000 mg | ORAL_TABLET | Freq: Three times a day (TID) | ORAL | Status: DC | PRN

## 2021-12-01 MED ORDER — INSULIN GLARGINE-YFGN 100 UNIT/ML ~~LOC~~ SOLN
50.0000 [IU] | Freq: Every day | SUBCUTANEOUS | Status: DC
  Administered 2021-12-01: 50 [IU] via SUBCUTANEOUS
  Filled 2021-12-01 (×3): qty 0.5

## 2021-12-01 NOTE — ED Provider Notes (Signed)
[facility] EMERGENCY DEPARTMENT Provider Note  CSN: [id number] Arrival date & time: 12/01/21 [id number]  Chief Complaint(s) Suicidal  HPI Troy Gordon is a 56 y.o. male with history of diabetes, hyperlipidemia, hypertension presenting with suicidal ideation.  Patient reports that he has suicidal ideation with a plan to overdose on medication he buys from the street.  He also reports homicidal ideation to his drug dealer which he does not name.  He does not have a definitive plan for this.  He reports his symptoms are worsened by homelessness.  Denies any hallucinations.  Denies any substance abuse.  Denies any medical complaints such as chest pain, nausea, runny nose, sore throat, belly pain.   Past Medical History Past Medical History:  Diagnosis Date   Depression    Diabetes mellitus ([place])    Dyslipidemia    Hyperlipidemia    Hypertension    Patient Active Problem List   Diagnosis Date Noted   Moderate cannabis use disorder ([place]) 03/13/2021   GAD (generalized anxiety disorder) 03/13/2021   Substance induced mood disorder ([place])    Cocaine use disorder, severe, dependence ([place]) 10/26/2020   Major depressive disorder, recurrent episode, moderate ([place]) 10/26/2020   MDD (major depressive disorder) 10/25/2020   Depression, major, single episode, moderate ([place]) 05/22/2019   Hyperlipidemia associated with type 2 diabetes mellitus ([place]) 06/07/2017   Benign essential HTN 11/20/2016   Type 2 diabetes mellitus with hyperglycemia, with long-term current use of insulin ([place]) 11/20/2016   Home Medication(s) Prior to Admission medications   Medication Sig Start Date End Date Taking? Authorizing Provider  amLODipine (NORVASC) 10 MG tablet Take 1 tablet (10 mg total) by mouth daily. 09/25/21  Yes Soyla Dryer, PA-C  atorvastatin (LIPITOR) 80 MG tablet Take 1 tablet (80 mg total) by mouth daily. 09/25/21  Yes Soyla Dryer, PA-C  diclofenac Sodium (VOLTAREN) 1 % GEL Apply 4 g topically 4  (four) times daily. 10/20/21  Yes Raul Del, Conner M, PA-C  hydrochlorothiazide (HYDRODIURIL) 25 MG tablet Take 1 tablet (25 mg total) by mouth daily. 09/25/21  Yes Soyla Dryer, PA-C  hydrOXYzine (ATARAX) 25 MG tablet Take 1 tablet (25 mg total) by mouth 3 (three) times daily as needed. Patient taking differently: Take 25 mg by mouth 3 (three) times daily as needed for anxiety. 09/24/21  Yes Revonda Humphrey, NP  insulin glargine (LANTUS) 100 UNIT/ML injection Inject 0.5 mLs (50 Units total) into the skin daily. For diabetes management 09/24/21  Yes Revonda Humphrey, NP  lisinopril (ZESTRIL) 20 MG tablet Take 1 tablet (20 mg total) by mouth daily. 09/25/21  Yes Soyla Dryer, PA-C  traZODone (DESYREL) 50 MG tablet Take 1 tablet (50 mg total) by mouth at bedtime as needed for sleep. 09/24/21  Yes Revonda Humphrey, NP  Past Surgical History Past Surgical History:  Procedure Laterality Date   CIRCUMCISION     as an adult   Family History Family History  Problem Relation Age of Onset   Diabetes Maternal Grandmother    Hypertension Maternal Grandmother    Hypertension Father     Social History Social History   Tobacco Use   Smoking status: Former    Years: 1.00    Types: Cigarettes   Smokeless tobacco: Never   Tobacco comments:    1 cig every 5 weeks  Vaping Use   Vaping Use: Never used  Substance Use Topics   Alcohol use: Not Currently    Comment: occasionally beer   Drug use: Yes    Types: Marijuana, Cocaine    Comment: denies use 08/05/21   Allergies Patient has no known allergies.  Review of Systems Review of Systems  All other systems reviewed and are negative.   Physical Exam Vital Signs  I have reviewed the triage vital signs BP (!) 143/94 (BP Location: Right Arm)   Pulse 80   Temp 98.1 F (36.7 C) (Oral)   Resp 17   Ht 5'  8" (1.727 m)   Wt 88.9 kg   SpO2 99%   BMI 29.80 kg/m  Physical Exam Vitals and nursing note reviewed.  Constitutional:      General: He is not in acute distress.    Appearance: Normal appearance.  HENT:     Mouth/Throat:     Mouth: Mucous membranes are moist.  Eyes:     Conjunctiva/sclera: Conjunctivae normal.  Cardiovascular:     Rate and Rhythm: Normal rate and regular rhythm.  Pulmonary:     Effort: Pulmonary effort is normal. No respiratory distress.     Breath sounds: Normal breath sounds.  Abdominal:     General: Abdomen is flat.     Palpations: Abdomen is soft.     Tenderness: There is no abdominal tenderness.  Musculoskeletal:     Right lower leg: No edema.     Left lower leg: No edema.  Skin:    General: Skin is warm and dry.     Capillary Refill: Capillary refill takes less than 2 seconds.  Neurological:     Mental Status: He is alert and oriented to person, place, and time. Mental status is at baseline.  Psychiatric:        Mood and Affect: Mood normal.        Behavior: Behavior normal.        Thought Content: Thought content includes homicidal and suicidal ideation. Thought content includes suicidal plan. Thought content does not include homicidal plan.     ED Results and Treatments Labs (all labs ordered are listed, but only abnormal results are displayed) Labs Reviewed  COMPREHENSIVE METABOLIC PANEL - Abnormal; Notable for the following components:      Result Value   Glucose, Bld 297 (*)    Creatinine, Ser 1.32 (*)    Calcium 8.8 (*)    All other components within normal limits  RAPID URINE DRUG SCREEN, HOSP PERFORMED - Abnormal; Notable for the following components:   Cocaine POSITIVE (*)    Tetrahydrocannabinol POSITIVE (*)    All other components within normal limits  ACETAMINOPHEN LEVEL - Abnormal; Notable for the following components:   Acetaminophen (Tylenol), Serum <10 (*)    All other components within normal limits  SALICYLATE LEVEL -  Abnormal; Notable for the following components:   Salicylate Lvl Q000111Q (*)  All other components within normal limits  RESP PANEL BY RT-PCR (FLU A&B, COVID) ARPGX2  ETHANOL  CBC WITH DIFFERENTIAL/PLATELET                                                                                                                          Radiology No results found.  Pertinent labs & imaging results that were available during my care of the patient were reviewed by me and considered in my medical decision making (see MDM for details).  Medications Ordered in ED Medications  OLANZapine zydis (ZYPREXA) disintegrating tablet 5 mg (has no administration in time range)    And  LORazepam (ATIVAN) tablet 1 mg (has no administration in time range)    And  ziprasidone (GEODON) injection 20 mg (has no administration in time range)                                                                                                                                     Procedures Procedures  (including critical care time)  Medical Decision Making / ED Course   MDM:  56 year old male presenting with suicidal ideation with plan and homicidal ideation without plan.  Discussed with psychiatry team, they will transfer patient [facility] the [facility].  Medically cleared, labs only notable for elevated glucose and elevated creatinine.  Elevated creatinine appears consistent with prior labs.  Encourage fluid intake.  No medical complaints. Placed on IVC given symptoms as patient is DTS/DTO      Additional history obtained:  -External records from outside source obtained and reviewed including: Chart review including previous notes, labs, imaging, consultation notes   Lab Tests: -I ordered, reviewed, and interpreted labs.   The pertinent results include:   Labs Reviewed  COMPREHENSIVE METABOLIC PANEL - Abnormal; Notable for the following components:      Result Value   Glucose, Bld 297 (*)     Creatinine, Ser 1.32 (*)    Calcium 8.8 (*)    All other components within normal limits  RAPID URINE DRUG SCREEN, HOSP PERFORMED - Abnormal; Notable for the following components:   Cocaine POSITIVE (*)    Tetrahydrocannabinol POSITIVE (*)    All other components within normal limits  ACETAMINOPHEN LEVEL - Abnormal; Notable for the following components:   Acetaminophen (Tylenol), Serum <10 (*)    All other components within normal limits  SALICYLATE LEVEL -  Abnormal; Notable for the following components:   Salicylate Lvl Q000111Q (*)    All other components within normal limits  RESP PANEL BY RT-PCR (FLU A&B, COVID) ARPGX2  ETHANOL  CBC WITH DIFFERENTIAL/PLATELET        Medicines ordered and prescription drug management: Meds ordered this encounter  Medications   AND Linked Order Group    OLANZapine zydis (ZYPREXA) disintegrating tablet 5 mg    LORazepam (ATIVAN) tablet 1 mg    ziprasidone (GEODON) injection 20 mg    -I have reviewed the patients home medicines and have made adjustments as needed   Consultations Obtained: I requested consultation with the psychiatry team,  and discussed lab and imaging findings as well as pertinent plan - they recommend[facility]   Cardiac Monitoring: The patient was maintained on a cardiac monitor.  I personally viewed and interpreted the cardiac monitored which showed an underlying rhythm of: NSR  Social Determinants of Health:  Factors impacting patients care include: former smoker and polysubstance abuse   Reevaluation: After the interventions noted above, I reevaluated the patient and found that they have stayed the same  Co morbidities that complicate the patient evaluation  Past Medical History:  Diagnosis Date   Depression    Diabetes mellitus ([place])    Dyslipidemia    Hyperlipidemia    Hypertension       Dispostion: Transfer    Final Clinical Impression(s) / ED Diagnoses Final diagnoses:  Suicidal  ideation  Homicidal ideation     This chart was dictated using voice recognition software.  Despite best efforts to proofread,  errors can occur which can change the documentation meaning.    Cristie Hem, MD 12/01/21 [phone]

## 2021-12-01 NOTE — Progress Notes (Signed)
Patient has been denied by [facility] due to no appropriate beds available. Patient meets [facility] inpatient criteria per Oneida Alar, NP. Patient has been faxed out to the following facilities:    [place] [address] [phone] [place], [place] [address] [phone]  [facility]  [place], [place] [address] [phone] [facility]  [address] [place] [address] [phone]  [facility]  [address] [place] [address] [phone]  [facility]  [place], [place] [address] [place]  [facility] [address] [place] [address] [phone] [facility]  [address] [place] [address] [phone] [facility]  [address] [place], [place] [address] [phone] [place] [address] [place] [address] [place]  [facility]  [address] [place] [address] [phone]  [facility]  [address], [place] [address] [phone]  [place] [place] [address] [phone]  [facility]  [address] [place] [address] [phone] [place]  [address], [place] [address] [phone] [place]  [address], [place] [address] [phone] [facility]  [address] [place] [address] [phone] [facility]  [address] [place] [address] [phone]  [facility]  [address], [place] [address] [phone] [facility]  [address], [place] [address] [phone] [facility]  [address] [place] [address] [name]  [facility]  [address], [place] [address] [phone]  [facility]  [address] [place] [address] [place]  [address] [place] [address] [phone]  [facility]  [address], [place] [address] [phone] [place], MSW, LCSW-A  9:48 PM 12/01/2021

## 2021-12-01 NOTE — ED Triage Notes (Signed)
Pts states he planned to buy xanax off the street and overdose. Pt states he has lost his job and is homeless and he wants to take his life.

## 2021-12-01 NOTE — ED Notes (Signed)
IVC paperwork faxed to [facility]

## 2021-12-01 NOTE — ED Notes (Signed)
Pt verbalized he feels homicidal towards his drug dealer and he is still SI.

## 2021-12-01 NOTE — BH Assessment (Addendum)
@  69, Oneida Alar, NP, requested this patient to be faxed out for inpatient treatment/placement. Clinician reached out to the [facility] to request that patient is reviewed for inpatient psychiatric treatment.   Patient also referred to the following facilities for consideration of inpatient placement.   Destination Service Provider Request Status Selected Services Address Phone Fax Patient Preferred  [place] [address] [place] [address] [phone] --  [facility]  Pending - Request Sent N/A [address], [place] [address] [phone] --  [place] N/A [address] [place], [place] [address] [phone] --  [place] N/A [address] [place] [address] [phone] --  [facility]  Pending - Request Sent N/A [address] [place] [address] [phone] --  [facility]  Pending - Request Sent N/A [place], [place] [address] [phone] --  [facility] [address] [place] [address] [phone] --  [place] [phone] [address] [place] [address] [place] --  [facility]  Pending - Request Sent N/A [address] [place], [place] [address] [phone] --  [facility]  Pending - Request Sent N/A [address] [place] [address] [phone] --  [facility]  Pending - Request Sent N/A [address], [place] [address] [phone] --  [facility]  Pending - Request Sent N/A [address] [place] [address] [phone] --  [facility]  Pending - Request Sent N/A [place] [address] [place] [address] [phone] --  [facility]  Pending - Request Sent N/A [address] [place] [address] [phone] --  [place] N/A [address] [place] [address] [phone] --  [facility]  Pending - Request Sent N/A [address] [place] [address] [phone] --  [facility]  Pending - Request Sent N/A [place], [place] [address] [phone] --  [facility]  Pending - Request Sent N/A [address], [place] [address] [phone] --  [facility]  Pending - Request Sent N/A [place] [place] [address] [phone] --  [facility]  Pending - Request Sent N/A [address], [place] [address] [phone] --  [place] N/A [address], [place] [address] [phone] --  [facility]  Pending - Request Sent N/A [address], [place] [address] [phone] --  [facility]  Pending - Request Sent N/A [address] [place] [address] [phone] --  [place] N/A [place], [place] [address] [phone] --  [facility]  Pending - Request Sent N/A [address] [place] [address] [phone]

## 2021-12-01 NOTE — Consult Note (Signed)
Telepsych Consultation   Reason for Consult:  suicidal  Referring Physician:  Lonell Grandchild, MD Location of Patient: [facility] Location of Provider: Behavioral Health TTS Department  Patient Identification: Troy Gordon MRN:  [id number] Principal Diagnosis: Substance induced mood disorder (HCC) Diagnosis:  Principal Problem:   Substance induced mood disorder (HCC) Active Problems:   Cocaine use disorder, severe, dependence (HCC)   Moderate cannabis use disorder (HCC)   Total Time spent with patient: 20 minutes  Subjective:   Troy Gordon is a 56 y.o. male patient admitted with suicidal ideations.  "Anxiety, having suicidal thoughts. I don't have no job, homeless."  He presents alert and oriented. Appears calm and cooperative; some c/o anxiety. Answers questions appropriately and appears logical and linear.  Reports losing his job x2 weeks ago and became homeless about a month ago. States he was able to "land and job" upon release from hospital in August. Endorses increased feelings of depression and hopelessness since most recent relapse on cocaine that he states triggered "downhill spiral". Last use (crack cocaine) x24 hours ago. Reports calling crisis line several nights ago after using. Expressed wanting to get help for his mental health and substance use problem so he can "get his life back on track". He endorses active suicidal ideations with vague intent and plan of "overdosing or running out into traffic". He denies any thoughts of wanting to harm anyone else or any active auditory or visual hallucinations.  HPI:  Troy Gordon is a 56 year old male with past psychiatric history of cocaine use disorder severe dependence, substance induced mood disorder, major depressive disorder recurrent episode moderate, moderate cannabis use disorder, generalized anxiety disorder who presented to [facility]/21 endorsing suicidal ideations. Patient was IVC'd. He endorses active  cocaine use. UDS+cocaine, THC. PDMP reviewed, no active prescriptions noted.   Past Psychiatric History: cocaine use disorder severe dependence, substance induced mood disorder, major depressive disorder recurrent episode moderate, moderate cannabis use disorder, generalized anxiety disorder  Risk to Self:   Risk to Others:   Prior Inpatient Therapy:   Prior Outpatient Therapy:    Past Medical History:  Past Medical History:  Diagnosis Date   Depression    Diabetes mellitus (HCC)    Dyslipidemia    Hyperlipidemia    Hypertension     Past Surgical History:  Procedure Laterality Date   CIRCUMCISION     as an adult   Family History:  Family History  Problem Relation Age of Onset   Diabetes Maternal Grandmother    Hypertension Maternal Grandmother    Hypertension Father    Family Psychiatric  History: not noted Social History:  Social History   Substance and Sexual Activity  Alcohol Use Not Currently   Comment: occasionally beer     Social History   Substance and Sexual Activity  Drug Use Yes   Types: Marijuana, Cocaine   Comment: denies use 08/05/21    Social History   Socioeconomic History   Marital status: Single    Spouse name: Not on file   Number of children: Not on file   Years of education: Not on file   Highest education level: Not on file  Occupational History   Not on file  Tobacco Use   Smoking status: Former    Years: 1.00    Types: Cigarettes   Smokeless tobacco: Never   Tobacco comments:    1 cig every 5 weeks  Vaping Use   Vaping Use: Never used  Substance and Sexual Activity   Alcohol use: Not Currently    Comment: occasionally beer   Drug use: Yes    Types: Marijuana, Cocaine    Comment: denies use 08/05/21   Sexual activity: Not on file  Other Topics Concern   Not on file  Social History Narrative   Not on file   Social Determinants of Health   Financial Resource Strain: Not on file  Food Insecurity: Not on file   Transportation Needs: Not on file  Physical Activity: Not on file  Stress: Not on file  Social Connections: Not on file   Additional Social History:    Allergies:  No Known Allergies  Labs:  Results for orders placed or performed during the hospital encounter of 12/01/21 (from the past 48 hour(s))  Resp Panel by RT-PCR (Flu A&B, Covid) Urine, Clean Catch     Status: None   Collection Time: 12/01/21 10:24 AM   Specimen: Urine, Clean Catch; Nasal Swab  Result Value Ref Range   SARS Coronavirus 2 by RT PCR NEGATIVE NEGATIVE    Comment: (NOTE) SARS-CoV-2 target nucleic acids are NOT DETECTED.  The SARS-CoV-2 RNA is generally detectable in upper respiratory specimens during the acute phase of infection. The lowest concentration of SARS-CoV-2 viral copies this assay can detect is 138 copies/mL. A negative result does not preclude SARS-Cov-2 infection and should not be used as the sole basis for treatment or other patient management decisions. A negative result may occur with  improper specimen collection/handling, submission of specimen other than nasopharyngeal swab, presence of viral mutation(s) within the areas targeted by this assay, and inadequate number of viral copies(<138 copies/mL). A negative result must be combined with clinical observations, patient history, and epidemiological information. The expected result is Negative.  Fact Sheet for Patients:  BloggerCourse.com  Fact Sheet for Healthcare Providers:  SeriousBroker.it  This test is no t yet approved or cleared by the [place] FDA and  has been authorized for detection and/or diagnosis of SARS-CoV-2 by FDA under an Emergency Use Authorization (EUA). This EUA will remain  in effect (meaning this test can be used) for the duration of the COVID-19 declaration under Section 564(b)(1) of the Act, 21 U.S.C.section 360bbb-3(b)(1), unless the authorization is  terminated  or revoked sooner.       Influenza A by PCR NEGATIVE NEGATIVE   Influenza B by PCR NEGATIVE NEGATIVE    Comment: (NOTE) The Xpert Xpress SARS-CoV-2/FLU/RSV plus assay is intended as an aid in the diagnosis of influenza from Nasopharyngeal swab specimens and should not be used as a sole basis for treatment. Nasal washings and aspirates are unacceptable for Xpert Xpress SARS-CoV-2/FLU/RSV testing.  Fact Sheet for Patients: BloggerCourse.com  Fact Sheet for Healthcare Providers: SeriousBroker.it  This test is not yet approved or cleared by the [place] FDA and has been authorized for detection and/or diagnosis of SARS-CoV-2 by FDA under an Emergency Use Authorization (EUA). This EUA will remain in effect (meaning this test can be used) for the duration of the COVID-19 declaration under Section 564(b)(1) of the Act, 21 U.S.C. section 360bbb-3(b)(1), unless the authorization is terminated or revoked.  Performed at [facility], [address] [place], [place] [address]   Urine rapid drug screen (hosp performed)     Status: Abnormal   Collection Time: 12/01/21 10:24 AM  Result Value Ref Range   Opiates NONE DETECTED NONE DETECTED   Cocaine POSITIVE (A) NONE DETECTED   Benzodiazepines NONE DETECTED NONE DETECTED  Amphetamines NONE DETECTED NONE DETECTED   Tetrahydrocannabinol POSITIVE (A) NONE DETECTED   Barbiturates NONE DETECTED NONE DETECTED    Comment: (NOTE) DRUG SCREEN FOR MEDICAL PURPOSES ONLY.  IF CONFIRMATION IS NEEDED FOR ANY PURPOSE, NOTIFY LAB WITHIN 5 DAYS.  LOWEST DETECTABLE LIMITS FOR URINE DRUG SCREEN Drug Class                     Cutoff (ng/mL) Amphetamine and metabolites    1000 Barbiturate and metabolites    200 Benzodiazepine                 200 Tricyclics and metabolites     300 Opiates and metabolites        300 Cocaine and metabolites        300 THC                             50 Performed at [facility], [address] [place], [place] [address]   Comprehensive metabolic panel     Status: Abnormal   Collection Time: 12/01/21 10:33 AM  Result Value Ref Range   Sodium 139 135 - 145 mmol/L   Potassium 3.7 3.5 - 5.1 mmol/L   Chloride 103 98 - 111 mmol/L   CO2 29 22 - 32 mmol/L   Glucose, Bld 297 (H) 70 - 99 mg/dL    Comment: Glucose reference range applies only to samples taken after fasting for at least 8 hours.   BUN 20 6 - 20 mg/dL   Creatinine, Ser 1.74 (H) 0.61 - 1.24 mg/dL   Calcium 8.8 (L) 8.9 - 10.3 mg/dL   Total Protein 6.8 6.5 - 8.1 g/dL   Albumin 3.6 3.5 - 5.0 g/dL   AST 20 15 - 41 U/L   ALT 32 0 - 44 U/L   Alkaline Phosphatase 87 38 - 126 U/L   Total Bilirubin 0.6 0.3 - 1.2 mg/dL   GFR, Estimated >94 >49 mL/min    Comment: (NOTE) Calculated using the CKD-EPI Creatinine Equation (2021)    Anion gap 7 5 - 15    Comment: Performed at [facility], [address] [place], [place] [address]  Ethanol     Status: None   Collection Time: 12/01/21 10:33 AM  Result Value Ref Range   Alcohol, Ethyl (B) <10 <10 mg/dL    Comment: (NOTE) Lowest detectable limit for serum alcohol is 10 mg/dL.  For medical purposes only. Performed at [facility], [address], [place], [place] [address]   CBC with Diff     Status: None   Collection Time: 12/01/21 10:33 AM  Result Value Ref Range   WBC 7.1 4.0 - 10.5 K/uL   RBC 5.14 4.22 - 5.81 MIL/uL   Hemoglobin 14.2 13.0 - 17.0 g/dL   HCT 65.9 93.5 - 70.1 %   MCV 82.9 80.0 - 100.0 fL   MCH 27.6 26.0 - 34.0 pg   MCHC 33.3 30.0 - 36.0 g/dL   RDW 77.9 39.0 - 30.0 %   Platelets 165 150 - 400 K/uL   nRBC 0.0 0.0 - 0.2 %   Neutrophils Relative % 56 %   Neutro Abs 3.9 1.7 - 7.7 K/uL   Lymphocytes Relative 36 %   Lymphs Abs 2.6 0.7 - 4.0 K/uL   Monocytes Relative 6 %   Monocytes Absolute 0.4 0.1 - 1.0 K/uL   Eosinophils Relative 2 %   Eosinophils Absolute  0.1 0.0 - 0.5 K/uL   Basophils Relative 0 %    Basophils Absolute 0.0 0.0 - 0.1 K/uL   Immature Granulocytes 0 %   Abs Immature Granulocytes 0.02 0.00 - 0.07 K/uL    Comment: Performed at [facility], [address] [place], [place] [address]  Acetaminophen level     Status: Abnormal   Collection Time: 12/01/21 10:33 AM  Result Value Ref Range   Acetaminophen (Tylenol), Serum <10 (L) 10 - 30 ug/mL    Comment: (NOTE) Therapeutic concentrations vary significantly. A range of 10-30 ug/mL  may be an effective concentration for many patients. However, some  are best treated at concentrations outside of this range. Acetaminophen concentrations >150 ug/mL at 4 hours after ingestion  and >50 ug/mL at 12 hours after ingestion are often associated with  toxic reactions.  Performed at [facility], [address], [place], [place] [address]   Salicylate level     Status: Abnormal   Collection Time: 12/01/21 10:33 AM  Result Value Ref Range   Salicylate Lvl <7.0 (L) 7.0 - 30.0 mg/dL    Comment: Performed at [facility], [address], [place], [place] [address]    Medications:  Current Facility-Administered Medications  Medication Dose Route Frequency Provider Last Rate Last Admin   amLODipine (NORVASC) tablet 10 mg  10 mg Oral Daily Lonell Grandchild, MD   10 mg at 12/01/21 1639   atorvastatin (LIPITOR) tablet 80 mg  80 mg Oral Daily Lonell Grandchild, MD   80 mg at 12/01/21 1639   hydrochlorothiazide (HYDRODIURIL) tablet 25 mg  25 mg Oral Daily Lonell Grandchild, MD   25 mg at 12/01/21 1639   insulin glargine-yfgn (SEMGLEE) injection 50 Units  50 Units Subcutaneous Daily Lonell Grandchild, MD       lisinopril (ZESTRIL) tablet 20 mg  20 mg Oral Daily Lonell Grandchild, MD   20 mg at 12/01/21 1639   OLANZapine zydis (ZYPREXA) disintegrating tablet 5 mg  5 mg Oral Q8H PRN Leevy-Johnson, Varnell Donate A, NP       And   LORazepam (ATIVAN) tablet 1 mg  1 mg Oral PRN Leevy-Johnson, Shalana Jardin A, NP       And   ziprasidone (GEODON) injection  20 mg  20 mg Intramuscular PRN Leevy-Johnson, Janny Crute A, NP       traZODone (DESYREL) tablet 50 mg  50 mg Oral QHS PRN Lonell Grandchild, MD       Current Outpatient Medications  Medication Sig Dispense Refill   amLODipine (NORVASC) 10 MG tablet Take 1 tablet (10 mg total) by mouth daily. 30 tablet 1   atorvastatin (LIPITOR) 80 MG tablet Take 1 tablet (80 mg total) by mouth daily. 30 tablet 1   diclofenac Sodium (VOLTAREN) 1 % GEL Apply 4 g topically 4 (four) times daily. 350 g 0   hydrochlorothiazide (HYDRODIURIL) 25 MG tablet Take 1 tablet (25 mg total) by mouth daily. 30 tablet 1   hydrOXYzine (ATARAX) 25 MG tablet Take 1 tablet (25 mg total) by mouth 3 (three) times daily as needed. (Patient taking differently: Take 25 mg by mouth 3 (three) times daily as needed for anxiety.) 90 tablet 1   insulin glargine (LANTUS) 100 UNIT/ML injection Inject 0.5 mLs (50 Units total) into the skin daily. For diabetes management 10 mL 1   lisinopril (ZESTRIL) 20 MG tablet Take 1 tablet (20 mg total) by mouth daily. 30 tablet 1   traZODone (DESYREL) 50 MG  tablet Take 1 tablet (50 mg total) by mouth at bedtime as needed for sleep. 30 tablet 1    Musculoskeletal: Strength & Muscle Tone: within normal limits Gait & Station: normal Patient leans: N/A  Psychiatric Specialty Exam:  Presentation  General Appearance: Casual  Eye Contact:Fair  Speech:Clear and Coherent  Speech Volume:Normal  Handedness:Right   Mood and Affect  Mood:Depressed  Affect:Congruent   Thought Process  Thought Processes:Coherent  Descriptions of Associations:Intact  Orientation:Full (Time, Place and Person)  Thought Content:Logical  History of Schizophrenia/Schizoaffective disorder:No  Duration of Psychotic Symptoms:N/A  Hallucinations:Hallucinations: None  Ideas of Reference:None  Suicidal Thoughts:Suicidal Thoughts: Yes, Passive SI Passive Intent and/or Plan: Without Intent; With Plan  Homicidal  Thoughts:Homicidal Thoughts: No (pt endorsed HI towards drug dealer to ED staff; denied any HI to provider)   Sensorium  Memory:Immediate Fair; Recent Fair; Remote Fair  Judgment:Fair  Insight:Fair   Executive Functions  Concentration:Fair  Attention Span:Fair  Recall:Fair  Fund of Knowledge:Fair  Language:Fair   Psychomotor Activity  Psychomotor Activity:Psychomotor Activity: Normal   Assets  Assets:Physical Health; Resilience   Sleep  Sleep:Sleep: Fair    Physical Exam: Physical Exam Vitals and nursing note reviewed.  Constitutional:      General: He is not in acute distress.    Appearance: He is normal weight. He is not ill-appearing.  HENT:     Head: Normocephalic.     Nose: Nose normal.     Mouth/Throat:     Mouth: Mucous membranes are moist.     Pharynx: Oropharynx is clear.  Eyes:     Pupils: Pupils are equal, round, and reactive to light.  Cardiovascular:     Rate and Rhythm: Normal rate.     Pulses: Normal pulses.  Pulmonary:     Effort: Pulmonary effort is normal.  Abdominal:     Palpations: Abdomen is soft.  Musculoskeletal:        General: Normal range of motion.     Cervical back: Normal range of motion.  Skin:    General: Skin is warm and dry.  Neurological:     Mental Status: He is alert and oriented to person, place, and time.  Psychiatric:        Attention and Perception: Attention and perception normal.        Mood and Affect: Mood is depressed.        Speech: Speech normal.        Behavior: Behavior is cooperative.        Thought Content: Thought content is not paranoid or delusional. Thought content includes suicidal ideation. Thought content does not include homicidal ideation. Thought content does not include homicidal plan.        Cognition and Memory: Cognition and memory normal.        Judgment: Judgment normal.     Comments: Vague plans    Review of Systems  Psychiatric/Behavioral:  Positive for depression,  substance abuse and suicidal ideas.   All other systems reviewed and are negative.  Blood pressure (!) 157/88, pulse 77, temperature 98.5 F (36.9 C), temperature source Oral, resp. rate 16, height  (1.727 m), weight 88.9 kg, SpO2 98 %. Body mass index is 29.8 kg/m.  Treatment Plan Summary: Daily contact with patient to assess and evaluate symptoms and progress in treatment, Medication management, and Plan seek inpatient psychiatric hospitalization for further observation, stabilization, and treatment.   Disposition: Recommend psychiatric Inpatient admission when medically cleared. Supportive therapy provided about ongoing stressors. Discussed  crisis plan, support from social network, calling 911, coming to the Emergency Department, and calling Suicide Hotline.  This service was provided via telemedicine using a 2-way, interactive audio and video technology.  Names of all persons participating in this telemedicine service and their role in this encounter. Name: Oneida Alar Role: PMHNp  Name: Hampton Abbot Role: Attending MD  Name: Troy Gordon Role: patient  Name:  Role:     Inda Merlin, NP 12/01/2021 6:00 PM

## 2021-12-01 NOTE — ED Notes (Signed)
Pt ambulated to the bathroom with sitter 

## 2021-12-01 NOTE — ED Notes (Signed)
AC called to bring pt's Semglee

## 2021-12-01 NOTE — ED Notes (Signed)
Pt given jello, graham crackers/peanut butter, and dt sprite

## 2021-12-01 NOTE — ED Notes (Signed)
Patient dressed out and possessions obtained by sitter. Security called to wand pt.

## 2021-12-01 NOTE — BH Assessment (Addendum)
@   1741, "[place] [place]" called from [name] [phone]. She is reviewing patient for admission to their facility. The following  have been requested before patient is considered at their hospital:   (1) IVC is faxed to [name] at [phone].  (2) Completed urinalysis (3) Medial Clearance documented in the H&P note.  (4) Glucose monitoring. According to "[facility]" patient's glucose level is 297. They have a cut off of 250 and request that this is monitored over the next 12 hours before they are able to consider him for placement at their facility.  (BAL must be 250 or lower)   Patient's nurse Caryl Pina, RN) provided the above information to address.

## 2021-12-02 LAB — CBG MONITORING, ED
Glucose-Capillary: 162 mg/dL — ABNORMAL HIGH (ref 70–99)
Glucose-Capillary: 378 mg/dL — ABNORMAL HIGH (ref 70–99)

## 2021-12-02 NOTE — ED Notes (Signed)
Waiting on pharmacy to bring [facility] to ER for 1000 due time

## 2021-12-02 NOTE — ED Notes (Signed)
Family picked up belongings (12/01/21     took to front said she was his mom

## 2021-12-02 NOTE — ED Notes (Signed)
Not able to give insulin due to not available  from pharmacy at this time

## 2021-12-02 NOTE — ED Notes (Signed)
Pt accepted to

## 2021-12-02 NOTE — ED Notes (Signed)
Pt accepted to [facility], may arrive anytime after 0700 on 12/02/2001. Accepting MD is Mliss Fritz Report to be called to [place]

## 2021-12-02 NOTE — ED Provider Notes (Signed)
  Physical Exam  BP (!) 131/93   Pulse 77   Temp 98.4 F (36.9 C)   Resp 17   Ht 5\' 8"  (1.727 m)   Wt 88.9 kg   SpO2 98%   BMI 29.80 kg/m   Physical Exam  Procedures  Procedures  ED Course / MDM    Medical Decision Making Amount and/or Complexity of Data Reviewed Labs: ordered.  Risk Prescription drug management.   Patient cleared for psychiatric transfer.  Going to [facility].  Accepted by Dr. Mliss Fritz.  Discussed with patient and reevaluated.       Davonna Belling, MD 12/02/21 1027

## 2021-12-04 ENCOUNTER — Telehealth: Payer: Self-pay

## 2021-12-04 NOTE — Telephone Encounter (Signed)
Client called main line today to let this RN know that he was admitted from [place] to [facility] in [place] . Unknown for how long.  Client shares during this call that he is now Homeless and will be interested in a residential treatment facility. Recommended client discuss referral with [facility] while he is there. The only facility here for men with MH and Substance Use Disorder is [facility]. Client states he will discuss that with his care team. He wants somewhere to stay and work at the same time and receive treatment. Discussed with client that at [facility] he will not be allowed to work for a period of time.   He states he is feeling some better and has been placed on some medications for his mental health. We discussed that he will need to continue treatment after discharge to remain on his medications. He states understanding.   [place] [employer]

## 2021-12-19 ENCOUNTER — Telehealth: Payer: Self-pay

## 2021-12-19 NOTE — Telephone Encounter (Signed)
Client called that he is at [facility] and since there he will re-establish primary care at [facility]. He ask to reschedule with [facility], discussed that he cannot keep two primary care providers and since [facility] staff prefer patients to be at [facility] that is the reason he was scheduled. He states understanding and will continue with [facility].  His appointment is 12/21/22.   Will contact [facility] to cancel appointment as client is not able to use his phone for now.  [place] [employer]

## 2021-12-20 ENCOUNTER — Ambulatory Visit: Payer: Medicaid Other | Admitting: Physician Assistant

## 2021-12-20 ENCOUNTER — Encounter: Payer: Self-pay | Admitting: Physician Assistant

## 2021-12-20 VITALS — BP 120/76 | HR 92 | Temp 98.0°F | Ht 67.75 in | Wt 192.5 lb

## 2021-12-20 DIAGNOSIS — F1911 Other psychoactive substance abuse, in remission: Secondary | ICD-10-CM

## 2021-12-20 DIAGNOSIS — E785 Hyperlipidemia, unspecified: Secondary | ICD-10-CM

## 2021-12-20 DIAGNOSIS — N189 Chronic kidney disease, unspecified: Secondary | ICD-10-CM

## 2021-12-20 DIAGNOSIS — E1165 Type 2 diabetes mellitus with hyperglycemia: Secondary | ICD-10-CM

## 2021-12-20 DIAGNOSIS — F489 Nonpsychotic mental disorder, unspecified: Secondary | ICD-10-CM

## 2021-12-20 DIAGNOSIS — Z7689 Persons encountering health services in other specified circumstances: Secondary | ICD-10-CM

## 2021-12-20 DIAGNOSIS — G8929 Other chronic pain: Secondary | ICD-10-CM

## 2021-12-20 DIAGNOSIS — B353 Tinea pedis: Secondary | ICD-10-CM

## 2021-12-20 DIAGNOSIS — I10 Essential (primary) hypertension: Secondary | ICD-10-CM

## 2021-12-20 MED ORDER — APIDRA SOLOSTAR 100 UNIT/ML ~~LOC~~ SOPN: PEN_INJECTOR | SUBCUTANEOUS | 0 refills | Status: DC

## 2021-12-20 MED ORDER — AMLODIPINE BESYLATE 10 MG PO TABS: 10.0000 mg | ORAL_TABLET | Freq: Every day | ORAL | 0 refills | Status: DC

## 2021-12-20 MED ORDER — ATORVASTATIN CALCIUM 80 MG PO TABS: 80.0000 mg | ORAL_TABLET | Freq: Every day | ORAL | 0 refills | Status: DC

## 2021-12-20 MED ORDER — HYDROCHLOROTHIAZIDE 25 MG PO TABS: 25.0000 mg | ORAL_TABLET | Freq: Every day | ORAL | 0 refills | Status: DC

## 2021-12-20 MED ORDER — LISINOPRIL 20 MG PO TABS: 20.0000 mg | ORAL_TABLET | Freq: Every day | ORAL | 0 refills | Status: DC

## 2021-12-20 NOTE — Progress Notes (Signed)
BP 120/76   Pulse 92   Temp 98 F (36.7 C)   Ht 5' 7.75" (1.721 m)   Wt 192 lb 8 oz (87.3 kg)   SpO2 97%   BMI 29.49 kg/m    Subjective:    Patient ID: Troy Gordon, male    DOB: September 02, 1965, 56 y.o.   MRN: [id number]  HPI: Troy Gordon is a 56 y.o. male presenting on 12/20/2021 for New Patient (Initial Visit)   HPI   Chief Complaint  Patient presents with   New Patient (Initial Visit)    Pt is 69yoM who presents to re-establish care.   He has been Back at [facility] for about a week.  He was Last seen here 09/25/21  Pt c/o right shoulder- hurting for about a year.  Xray in ER on 08/05/21.  Unremarkable.     He is going to [facility] for [place]  He denies CP, sob.   Relevant past medical, surgical, family and social history reviewed and updated as indicated. Interim medical history since our last visit reviewed. Allergies and medications reviewed and updated.  Review of Systems  Per HPI unless specifically indicated above     Objective:    BP 120/76   Pulse 92   Temp 98 F (36.7 C)   Ht 5' 7.75" (1.721 m)   Wt 192 lb 8 oz (87.3 kg)   SpO2 97%   BMI 29.49 kg/m   Wt Readings from Last 3 Encounters:  12/20/21 192 lb 8 oz (87.3 kg)  12/01/21 196 lb (88.9 kg)  10/20/21 195 lb 9 oz (88.7 kg)    Physical Exam Vitals reviewed.  Constitutional:      General: He is not in acute distress.    Appearance: He is well-developed. He is not ill-appearing.  HENT:     Head: Normocephalic and atraumatic.     Right Ear: Tympanic membrane, ear canal and external ear normal.     Left Ear: Tympanic membrane, ear canal and external ear normal.  Eyes:     Extraocular Movements: Extraocular movements intact.     Conjunctiva/sclera: Conjunctivae normal.     Pupils: Pupils are equal, round, and reactive to light.  Neck:     Thyroid: No thyromegaly.  Cardiovascular:     Rate and Rhythm: Normal rate and regular rhythm.  Pulmonary:     Effort: Pulmonary effort is normal.      Breath sounds: Normal breath sounds. No wheezing or rales.  Abdominal:     General: Bowel sounds are normal.     Palpations: Abdomen is soft. There is no mass.     Tenderness: There is no abdominal tenderness.  Musculoskeletal:     Right shoulder: Tenderness present. No swelling or deformity. Decreased range of motion. Normal pulse.     Left shoulder: Normal range of motion.     Cervical back: Neck supple.     Right lower leg: No edema.     Left lower leg: No edema.     Comments: Abduction limited to just past 90 degrees.  Extension also decreased.   Feet:     Comments: Some skin breakdown toe webspaces (lateral 2 on right foot, lateral 1 on left foot).  No secondary infection Lymphadenopathy:     Cervical: No cervical adenopathy.  Skin:    General: Skin is warm and dry.     Findings: No rash.  Neurological:     Mental Status: He is alert and oriented to  person, place, and time.  Psychiatric:        Behavior: Behavior normal.           Assessment & Plan:   Encounter Diagnoses  Name Primary?   Encounter to establish care Yes   Uncontrolled type 2 diabetes mellitus with hyperglycemia ([place])    Primary hypertension    Hyperlipidemia, unspecified hyperlipidemia type    Chronic kidney disease, unspecified CKD stage    Mental health problem    Substance abuse in remission (HCC)    Tinea pedis of both feet    Chronic right shoulder pain     -Med list updated, insulins ordered.  Will Stop qhs insulin -will Update labs- fasting -refer for annual DM eye exam -Pt does not want to update HIPAA (he reviewed form done in April) -Refer to ortho for right shoulder-   -pt is given application for [employer] financial assistance / C[employer] -pt was counseled on foot care and treatment of tinea.  He is encouraged to use OTC athletes foot cream  -pt to continue with [facility] for MH issues & substance abuse -pt will follow up 3-4 weeks.  He is to contact office sooner prn

## 2022-01-02 ENCOUNTER — Other Ambulatory Visit (HOSPITAL_COMMUNITY)
Admission: RE | Admit: 2022-01-02 | Discharge: 2022-01-02 | Disposition: A | Discharge status: Home / Self Care | Payer: Self-pay | Source: Ambulatory Visit | Attending: Physician Assistant | Admitting: Physician Assistant

## 2022-01-02 DIAGNOSIS — E785 Hyperlipidemia, unspecified: Secondary | ICD-10-CM | POA: Insufficient documentation

## 2022-01-02 DIAGNOSIS — I10 Essential (primary) hypertension: Secondary | ICD-10-CM | POA: Insufficient documentation

## 2022-01-02 DIAGNOSIS — N189 Chronic kidney disease, unspecified: Secondary | ICD-10-CM | POA: Insufficient documentation

## 2022-01-02 DIAGNOSIS — E1165 Type 2 diabetes mellitus with hyperglycemia: Secondary | ICD-10-CM | POA: Insufficient documentation

## 2022-01-02 LAB — COMPREHENSIVE METABOLIC PANEL
ALT: 33 U/L (ref 0–44)
AST: 24 U/L (ref 15–41)
Albumin: 4 g/dL (ref 3.5–5.0)
Alkaline Phosphatase: 80 U/L (ref 38–126)
Anion gap: 8 (ref 5–15)
BUN: 20 mg/dL (ref 6–20)
CO2: 30 mmol/L (ref 22–32)
Calcium: 9.4 mg/dL (ref 8.9–10.3)
Chloride: 103 mmol/L (ref 98–111)
Creatinine, Ser: 1.26 mg/dL — ABNORMAL HIGH (ref 0.61–1.24)
GFR, Estimated: >60 mL/min (ref >60)
Glucose, Bld: 119 mg/dL — ABNORMAL HIGH (ref 70–99)
Potassium: 3.7 mmol/L (ref 3.5–5.1)
Sodium: 141 mmol/L (ref 135–145)
Total Bilirubin: 0.4 mg/dL (ref 0.3–1.2)
Total Protein: 7.2 g/dL (ref 6.5–8.1)

## 2022-01-02 LAB — LIPID PANEL
Cholesterol: 165 mg/dL (ref 0–200)
HDL: 43 mg/dL (ref >40)
LDL Cholesterol: 100 mg/dL — ABNORMAL HIGH (ref 0–99)
Total CHOL/HDL Ratio: 3.8 RATIO
Triglycerides: 108 mg/dL (ref <150)
VLDL: 22 mg/dL (ref 0–40)

## 2022-01-02 LAB — HEMOGLOBIN A1C
Hgb A1c MFr Bld: 9.9 % — ABNORMAL HIGH (ref 4.8–5.6)
Mean Plasma Glucose: 237.43 mg/dL

## 2022-01-08 ENCOUNTER — Telehealth: Payer: Self-pay

## 2022-01-08 NOTE — Telephone Encounter (Signed)
Returned call to client who is asking about the status of his CAFA. Client's CAFA is still being reviewed  Made client aware that at times it can take up to 30 days for approval. He states understanding. He has a a follow up with PCP this week, reminded client to take his blood sugar log sheets. He states that [facility] keeps up with all those. He reports AM glucose 117s to 120s. He is happy that they are now allowing him to go to the [facility] and participate in the DM exercise program funded with [employer] in collaboration with scholarships from [employer]. He states he enjoys being able to go walk on the treadmill and other things. He reports he is liking IOP "intensive outpatient therapy program" Encouraged client to continue with the program.  Will continue to follow.   [place] [employer]

## 2022-01-09 ENCOUNTER — Ambulatory Visit: Payer: Medicaid Other | Admitting: Physician Assistant

## 2022-01-09 ENCOUNTER — Encounter: Payer: Self-pay | Admitting: Physician Assistant

## 2022-01-09 VITALS — BP 106/78 | HR 91 | Temp 94.2°F | Ht 67.75 in | Wt 193.6 lb

## 2022-01-09 DIAGNOSIS — G8929 Other chronic pain: Secondary | ICD-10-CM

## 2022-01-09 DIAGNOSIS — F1911 Other psychoactive substance abuse, in remission: Secondary | ICD-10-CM

## 2022-01-09 DIAGNOSIS — E785 Hyperlipidemia, unspecified: Secondary | ICD-10-CM

## 2022-01-09 DIAGNOSIS — E1165 Type 2 diabetes mellitus with hyperglycemia: Secondary | ICD-10-CM

## 2022-01-09 DIAGNOSIS — I10 Essential (primary) hypertension: Secondary | ICD-10-CM

## 2022-01-09 NOTE — Progress Notes (Unsigned)
BP 106/78   Pulse 91   Temp (!) 94.2 F (34.6 C) Comment: low temp due to sweating from walking to his appt.  Ht 5' 7.75" (1.721 m)   Wt 193 lb 9.6 oz (87.8 kg)   SpO2 98%   BMI 29.65 kg/m    Subjective:    Patient ID: Troy Gordon, male    DOB: November 02, 1965, 56 y.o.   MRN: [id number]  HPI: Troy Gordon is a 56 y.o. male presenting on 01/09/2022 for Diabetes, Hypertension, and Hyperlipidemia   HPI Chief Complaint  Patient presents with   Diabetes   Hypertension   Hyperlipidemia      He is still at [facility].  He is going to [facility].    His Only complaint is right shoulder.  He was referred to orthopedics but hasn't been scheduled yet.  He is waiting until he gets approved for his financial assistance.  He says he submitted cafa/application for [employer] financial assistance.  He says it is pending  He has bs log with him.  Since gettiing back on his regular meds, his bs are doing well.  He has had no lows.     Relevant past medical, surgical, family and social history reviewed and updated as indicated. Interim medical history since our last visit reviewed. Allergies and medications reviewed and updated.   Current Outpatient Medications:    acetaminophen (TYLENOL) 500 MG tablet, Take 500 mg by mouth every 6 (six) hours as needed., Disp: , Rfl:    amLODipine (NORVASC) 10 MG tablet, Take 1 tablet (10 mg total) by mouth daily., Disp: 90 tablet, Rfl: 0   atorvastatin (LIPITOR) 80 MG tablet, Take 1 tablet (80 mg total) by mouth daily., Disp: 90 tablet, Rfl: 0   escitalopram (LEXAPRO) 10 MG tablet, Take 10 mg by mouth daily., Disp: , Rfl:    hydrochlorothiazide (HYDRODIURIL) 25 MG tablet, Take 1 tablet (25 mg total) by mouth daily., Disp: 90 tablet, Rfl: 0   insulin glargine (LANTUS) 100 UNIT/ML injection, Inject 0.5 mLs (50 Units total) into the skin daily. For diabetes management, Disp: 10 mL, Rfl: 1   insulin glulisine (APIDRA SOLOSTAR) 100 UNIT/ML Solostar Pen,  Ssi- tid ac- 151-200- 2 units, 201-250- 4 units, 251-300- 6 units, 301-400- 10 units, > 400- 12 units and call office, Disp: 15 mL, Rfl: 0   lisinopril (ZESTRIL) 20 MG tablet, Take 1 tablet (20 mg total) by mouth daily., Disp: 90 tablet, Rfl: 0    Review of Systems  Per HPI unless specifically indicated above     Objective:    BP 106/78   Pulse 91   Temp (!) 94.2 F (34.6 C) Comment: low temp due to sweating from walking to his appt.  Ht 5' 7.75" (1.721 m)   Wt 193 lb 9.6 oz (87.8 kg)   SpO2 98%   BMI 29.65 kg/m   Wt Readings from Last 3 Encounters:  01/09/22 193 lb 9.6 oz (87.8 kg)  12/20/21 192 lb 8 oz (87.3 kg)  12/01/21 196 lb (88.9 kg)    Physical Exam Constitutional:      General: He is not in acute distress.    Appearance: He is not toxic-appearing.  HENT:     Head: Normocephalic and atraumatic.  Cardiovascular:     Rate and Rhythm: Normal rate and regular rhythm.  Pulmonary:     Effort: Pulmonary effort is normal. No respiratory distress.     Breath sounds: Normal breath sounds.  Neurological:  Mental Status: He is alert and oriented to person, place, and time.  Psychiatric:        Behavior: Behavior normal.     Results for orders placed or performed during the hospital encounter of 01/02/22  Hemoglobin A1c  Result Value Ref Range   Hgb A1c MFr Bld 9.9 (H) 4.8 - 5.6 %   Mean Plasma Glucose 237.43 mg/dL  Lipid panel  Result Value Ref Range   Cholesterol 165 0 - 200 mg/dL   Triglycerides 108 <150 mg/dL   HDL 43 >40 mg/dL   Total CHOL/HDL Ratio 3.8 RATIO   VLDL 22 0 - 40 mg/dL   LDL Cholesterol 100 (H) 0 - 99 mg/dL  Comprehensive metabolic panel  Result Value Ref Range   Sodium 141 135 - 145 mmol/L   Potassium 3.7 3.5 - 5.1 mmol/L   Chloride 103 98 - 111 mmol/L   CO2 30 22 - 32 mmol/L   Glucose, Bld 119 (H) 70 - 99 mg/dL   BUN 20 6 - 20 mg/dL   Creatinine, Ser 1.26 (H) 0.61 - 1.24 mg/dL   Calcium 9.4 8.9 - 10.3 mg/dL   Total Protein 7.2 6.5 -  8.1 g/dL   Albumin 4.0 3.5 - 5.0 g/dL   AST 24 15 - 41 U/L   ALT 33 0 - 44 U/L   Alkaline Phosphatase 80 38 - 126 U/L   Total Bilirubin 0.4 0.3 - 1.2 mg/dL   GFR, Estimated >60 >60 mL/min   Anion gap 8 5 - 15      Assessment & Plan:    Encounter Diagnoses  Name Primary?   Uncontrolled type 2 diabetes mellitus with hyperglycemia ([place]) Yes   Primary hypertension    Hyperlipidemia, unspecified hyperlipidemia type    Substance abuse in remission ([place])    Chronic right shoulder pain       -reviewed labs with pt -pt to continue current medications -pt to monitor bs -pt to call orthopedics to get appointment when he gets his approval letter for financial assistance -pt to follow up 3 months.  He is to contact office sooner prn

## 2022-01-09 NOTE — Patient Instructions (Signed)
CONTINUE CURRENT MEDICATIONS- NO CHANGES

## 2022-01-22 ENCOUNTER — Telehealth: Payer: Self-pay | Admitting: Radiology

## 2022-01-22 ENCOUNTER — Telehealth: Payer: Self-pay

## 2022-01-22 NOTE — Telephone Encounter (Signed)
Returned call to client, he is asking if his CAFA has been approved yet. Troy Gordon checked and application is still awaiting review. Client notified it could take up to 30 days. He states he understands. He is back now going to the [employer] to work out, he states [employer] is allowing him to go. He states he tries to go every other day. His 6 months initial part of his [employer] scholarship is due to be renewed as of 01/29/22. As client is again going regularly and reporting his morning blood sugars improving to 170s. Will extend for 6 more months. For a total of 1 year.  Francee Nodal RN [employer]

## 2022-01-22 NOTE — Telephone Encounter (Signed)
VM fro [employer] Financial Assistance is pending, but underway.  Ok to schedule appt.  I tried to call, got Kai's VM, will call again tomorrow to get patient scheduled.  [facility] can fax release if needed for chart.

## 2022-01-30 ENCOUNTER — Ambulatory Visit (INDEPENDENT_AMBULATORY_CARE_PROVIDER_SITE_OTHER): Payer: Self-pay | Admitting: Orthopaedic Surgery

## 2022-01-30 ENCOUNTER — Encounter: Payer: Self-pay | Admitting: Orthopaedic Surgery

## 2022-01-30 VITALS — Ht 68.0 in | Wt 196.0 lb

## 2022-01-30 DIAGNOSIS — S46001A Unspecified injury of muscle(s) and tendon(s) of the rotator cuff of right shoulder, initial encounter: Secondary | ICD-10-CM

## 2022-01-30 DIAGNOSIS — M25511 Pain in right shoulder: Secondary | ICD-10-CM

## 2022-01-30 DIAGNOSIS — G8929 Other chronic pain: Secondary | ICD-10-CM

## 2022-01-30 MED ORDER — METHYLPREDNISOLONE ACETATE 40 MG/ML IJ SUSP
40.0000 mg | Freq: Once | INTRAMUSCULAR | Status: AC
  Administered 2022-01-31: 40 mg via INTRA_ARTICULAR

## 2022-01-30 NOTE — Progress Notes (Signed)
Subjective:    Patient ID: Troy Gordon, male    DOB: [birth year], 56 y.o.   MRN: [id number]  HPI He fell and hurt his right shoulder about a year ago.  He thought it was OK but it has gradually been getting worse and worse.  He has pain with overhead use and sleeping on it.  He has no swelling, no redness, no numbness.  He is in [facility] program.  He has not improved with rest, heat, ice, ibuprofen.  He is tired of hurting.   Review of Systems  Constitutional:  Positive for activity change.  Musculoskeletal:  Positive for arthralgias.  All other systems reviewed and are negative. For Review of Systems, all other systems reviewed and are negative.  The following is a summary of the past history medically, past history surgically, known current medicines, social history and family history.  This information is gathered electronically by the computer from prior information and documentation.  I review this each visit and have found including this information at this point in the chart is beneficial and informative.   Past Medical History:  Diagnosis Date   Depression    Diabetes mellitus ([place])    Dyslipidemia    Hyperlipidemia    Hypertension     Past Surgical History:  Procedure Laterality Date   CIRCUMCISION     as an adult    Current Outpatient Medications on File Prior to Visit  Medication Sig Dispense Refill   acetaminophen (TYLENOL) 500 MG tablet Take 500 mg by mouth every 6 (six) hours as needed.     amLODipine (NORVASC) 10 MG tablet Take 1 tablet (10 mg total) by mouth daily. 90 tablet 0   atorvastatin (LIPITOR) 80 MG tablet Take 1 tablet (80 mg total) by mouth daily. 90 tablet 0   hydrochlorothiazide (HYDRODIURIL) 25 MG tablet Take 1 tablet (25 mg total) by mouth daily. 90 tablet 0   insulin glargine (LANTUS) 100 UNIT/ML injection Inject 0.5 mLs (50 Units total) into the skin daily. For diabetes management 10 mL 1   insulin glulisine (APIDRA SOLOSTAR) 100 UNIT/ML  Solostar Pen Ssi- tid ac- 151-200- 2 units, 201-250- 4 units, 251-300- 6 units, 301-400- 10 units, > 400- 12 units and call office 15 mL 0   lisinopril (ZESTRIL) 20 MG tablet Take 1 tablet (20 mg total) by mouth daily. 90 tablet 0   ZOLOFT 50 MG tablet Take by mouth.     No current facility-administered medications on file prior to visit.    Social History   Socioeconomic History   Marital status: Single    Spouse name: Not on file   Number of children: Not on file   Years of education: Not on file   Highest education level: Not on file  Occupational History   Not on file  Tobacco Use   Smoking status: Former    Years: 1.00    Types: Cigarettes   Smokeless tobacco: Never   Tobacco comments:    1 cig every 5 weeks  Vaping Use   Vaping Use: Never used  Substance and Sexual Activity   Alcohol use: Not Currently    Comment: occasionally beer   Drug use: Yes    Types: Marijuana, Cocaine    Comment: denies use 08/05/21   Sexual activity: Not on file  Other Topics Concern   Not on file  Social History Narrative   Not on file   Social Determinants of Health   Financial Resource Strain:  Not on file  Food Insecurity: Not on file  Transportation Needs: Not on file  Physical Activity: Not on file  Stress: Not on file  Social Connections: Not on file  Intimate Partner Violence: Not on file    Family History  Problem Relation Age of Onset   Diabetes Maternal Grandmother    Hypertension Maternal Grandmother    Hypertension Father     Ht 5\' 8"  (1.727 m)   Wt 196 lb (88.9 kg)   BMI 29.80 kg/m   Body mass index is 29.8 kg/m.      Objective:   Physical Exam Vitals and nursing note reviewed. Exam conducted with a chaperone present.  Constitutional:      Appearance: He is well-developed.  HENT:     Head: Normocephalic and atraumatic.  Eyes:     Conjunctiva/sclera: Conjunctivae normal.     Pupils: Pupils are equal, round, and reactive to light.  Cardiovascular:      Rate and Rhythm: Normal rate and regular rhythm.  Pulmonary:     Effort: Pulmonary effort is normal.  Abdominal:     Palpations: Abdomen is soft.  Musculoskeletal:       Arms:     Cervical back: Normal range of motion and neck supple.  Skin:    General: Skin is warm and dry.  Neurological:     Mental Status: He is alert and oriented to person, place, and time.     Cranial Nerves: No cranial nerve deficit.     Motor: No abnormal muscle tone.     Coordination: Coordination normal.     Deep Tendon Reflexes: Reflexes are normal and symmetric. Reflexes normal.  Psychiatric:        Behavior: Behavior normal.        Thought Content: Thought content normal.        Judgment: Judgment normal.   He had X-rays done in May.  I have reviewed them.  They are negative. I have independently reviewed and interpreted x-rays of this patient done at another site by another physician or qualified health professional.  I have reviewed notes from his primary care.        Assessment & Plan:   Encounter Diagnoses  Name Primary?   Chronic right shoulder pain Yes   Rotator cuff injury, right, initial encounter    I would like to get MRI of the right shoulder.  PROCEDURE NOTE:  The patient request injection, verbal consent was obtained.  The right shoulder was prepped appropriately after time out was performed.   Sterile technique was observed and injection of 1 cc of DepoMedrol 40mg  with several cc's of plain xylocaine. Anesthesia was provided by ethyl chloride and a 20-gauge needle was used to inject the shoulder area. A posterior approach was used.  The injection was tolerated well.  A band aid dressing was applied.  The patient was advised to apply ice later today and tomorrow to the injection sight as needed.  I will begin Naprosyn 500 po bid pc.  Call if any problem.  Precautions discussed.  Electronically Signed June, MD 11/21/20232:29 PM

## 2022-01-30 NOTE — Patient Instructions (Signed)
Central Scheduling [phone]  While we are working on your approval please go ahead and call to schedule your appointment to be done within one week. If you can not get an appointment at [facility] within the next week, ask if they have something sooner at [facility] or [facility] if you are able to go to [place] to have the imaging done.  AFTER you have made your imaging appointment, please call our office back at [phone] to schedule an appointment to review your results.  CURRENTLY [employer] ONLY AUTHORIZES IMAGING TO BE PERFORMED AT [facility]. THEY WILL NOT APPROVE ANY OTHER FACILITY  [facility] Address: [address] [facility] [address], [place], [place] [address] Phone: [phone]   [facility] [address] [place], [place] [address] Phone: [phone]  

## 2022-02-05 ENCOUNTER — Telehealth: Payer: Self-pay | Admitting: Orthopedic Surgery

## 2022-02-05 NOTE — Telephone Encounter (Signed)
Norval Gable, RN w/[facility] returning your call.  Phone # [phone]

## 2022-02-06 ENCOUNTER — Telehealth: Payer: Self-pay | Admitting: Orthopaedic Surgery

## 2022-02-06 NOTE — Telephone Encounter (Signed)
Spoke with Mrs. Troy Gordon. She states that she assisted patient with submitting paperwork to apply for [facility] Assistance and that was submitted on 01/19/22 and as of this morning has not been reviewed. She doesn't have authority to approve or deny imaging. She will send a secure email advising as to whether or not patient will roll over to [facility] 02/09/22.

## 2022-02-06 NOTE — Telephone Encounter (Signed)
Return patient call left voicemail for patient to call us back

## 2022-02-13 ENCOUNTER — Ambulatory Visit (INDEPENDENT_AMBULATORY_CARE_PROVIDER_SITE_OTHER): Payer: Medicaid Other | Admitting: Internal Medicine

## 2022-02-13 ENCOUNTER — Encounter: Payer: Self-pay | Admitting: Internal Medicine

## 2022-02-13 VITALS — BP 124/79 | HR 82 | Temp 98.6°F | Ht 68.0 in | Wt 197.0 lb

## 2022-02-13 DIAGNOSIS — F3340 Major depressive disorder, recurrent, in remission, unspecified: Secondary | ICD-10-CM

## 2022-02-13 DIAGNOSIS — Z114 Encounter for screening for human immunodeficiency virus [HIV]: Secondary | ICD-10-CM | POA: Diagnosis not present

## 2022-02-13 DIAGNOSIS — I10 Essential (primary) hypertension: Secondary | ICD-10-CM

## 2022-02-13 DIAGNOSIS — M25511 Pain in right shoulder: Secondary | ICD-10-CM

## 2022-02-13 DIAGNOSIS — Z1159 Encounter for screening for other viral diseases: Secondary | ICD-10-CM

## 2022-02-13 DIAGNOSIS — F1420 Cocaine dependence, uncomplicated: Secondary | ICD-10-CM

## 2022-02-13 DIAGNOSIS — E1165 Type 2 diabetes mellitus with hyperglycemia: Secondary | ICD-10-CM

## 2022-02-13 DIAGNOSIS — E119 Type 2 diabetes mellitus without complications: Secondary | ICD-10-CM | POA: Diagnosis not present

## 2022-02-13 DIAGNOSIS — G8929 Other chronic pain: Secondary | ICD-10-CM

## 2022-02-13 DIAGNOSIS — Z794 Long term (current) use of insulin: Secondary | ICD-10-CM

## 2022-02-13 MED ORDER — INSULIN GLARGINE 100 UNIT/ML ~~LOC~~ SOLN: 50.0000 [IU] | Freq: Every day | SUBCUTANEOUS | 1 refills | Status: DC

## 2022-02-13 MED ORDER — NAPROXEN 500 MG PO TABS: 500.0000 mg | ORAL_TABLET | Freq: Two times a day (BID) | ORAL | 0 refills | Status: DC

## 2022-02-13 MED ORDER — METFORMIN HCL ER 500 MG PO TB24: ORAL_TABLET | ORAL | 0 refills | Status: DC

## 2022-02-13 NOTE — Progress Notes (Signed)
CC: Establish Care (Right shoulder pain and follow up - fall in August 2022)   HPI:Mr.Troy Gordon is a 56 y.o. male medical history of cocaine use in remission, MDD in remission, HLD, HTN,T2DM, and right shoulder pain. He is here to establish care in our clinic today. For the details of today's visit, please refer to the assessment and plan.   Past Medical History:  Diagnosis Date   Depression    Diabetes mellitus (HCC)    Dyslipidemia    Hyperlipidemia    Hypertension     Past Surgical History:  Procedure Laterality Date   CIRCUMCISION     as an adult    Family History  Problem Relation Age of Onset   Diabetes Maternal Grandmother    Hypertension Maternal Grandmother    Hypertension Father     Social History   Tobacco Use   Smoking status: Former    Years: 1.00    Types: Cigarettes   Smokeless tobacco: Never   Tobacco comments:    1 cig every 5 weeks  Vaping Use   Vaping Use: Never used  Substance Use Topics   Alcohol use: Not Currently    Comment: occasionally beer   Drug use: Yes    Types: Marijuana, Cocaine    Comment: denies use 08/05/21   Physical Exam: Vitals:   02/13/22 1607  BP: 124/79  Pulse: 82  Temp: 98.6 F (37 C)  SpO2: 96%  Weight: 197 lb (89.4 kg)  Height: 5\' 8"  (1.727 m)    General: NAD, nl appearance HEENT: Conjunctivae normal,ENT: No congestion, no rhinorrhea, no exudate or erythema  Cardiovascular: Normal rate, regular rhythm.  No murmurs, rubs, or gallops Pulmonary : Effort normal, breath sounds normal. No wheezes, rales, or rhonchi Abdominal: soft, nontender,  bowel sounds present Musculoskeletal: Positive empty can test. FROM Skin: Warm, dry , no bruising, erythema, or rash Psychiatric/Behavioral:  normal mood, normal behavior      Assessment & Plan:   Chronic right shoulder pain Patient following with Dr.Keeling for right shoulder pain. Underwent steroid injection and MRI ordered. His insurance has changed  and he need referral to Dr.Keeling. Continues to have some pain on exam and exam suggest injury to supraspinatus.   - Ambulatory referral to Orthopedic Surgery - naproxen (NAPROSYN) 500 MG tablet; Take 1 tablet (500 mg total) by mouth 2 (two) times daily with a meal.  Dispense: 20 tablet; Refill: 0  Cocaine use disorder, severe, dependence (HCC) Patient is in remission. Currently in program , [employer]. Doing well and learning a lot about addiction. Encouraged for his progress and sobriety.   MDD (recurrent major depressive disorder) in remission (HCC) In remission. No feelings of depression today. He is doing Patient is doing well on Zoloft   Benign essential HTN BP 124/79 today Chronic problem, controlled Continue Lisinopril 20 mg, Amlodipine 10 mg, HCTZ 25 mg  Uncontrolled type 2 diabetes mellitus with hyperglycemia (HCC) Hx of T2DM on Insulin. Review of hemoglobin A1c shows mostly uncontrolled. Currently on Lantus 50 units and SSI. He has a log of blood glucose for the past 7 days. He has only require a few units of sliding scale insulin on 2 occasions.    Assessment/Plan: T2DM, uncontrolled, on insulin only. Did not tolerate metformin previously. Starting Metformin XR with ramp and instructed to stop at dose he does not have symptoms.  - continue Lantus 50 units QHS - metFORMIN (GLUCOPHAGE-XR) 500 MG 24 hr tablet; Take 500 mg  by mouth once daily x1 week, then increase to 500 mg twice daily x1 week, then 1,000 mg in the AM and 500 mg in the PM x1 week, and finally 1,000 mg twice daily. Take with food.  Dispense: 120 tablet; Refill: 0 - Microalbumin / creatinine urine ratio - Ambulatory referral to Optometry - Follow up in 1 month with blood glucose log  Need for hepatitis C screening test One time screening today. - HCV Ab w Reflex to Quant PCR  Screening for HIV (human immunodeficiency virus) One time screening today. - HIV Antibody (routine testing w rflx)    Milus Banister,  MD

## 2022-02-13 NOTE — Patient Instructions (Signed)
Thank you for trusting me with your care. To recap, today we discussed the following:   Chronic right shoulder pain - Ambulatory referral to Orthopedic Surgery - naproxen (NAPROSYN) 500 MG tablet; Take 1 tablet (500 mg total) by mouth 2 (two) times daily with a meal.  Dispense: 20 tablet; Refill: 0  Type 2 diabetes mellitus without complication, with long-term current use of insulin (HCC) - metFORMIN (GLUCOPHAGE-XR) 500 MG 24 hr tablet; Take 500 mg by mouth once daily x1 week, then increase to 500 mg twice daily x1 week, then 1,000 mg in the AM and 500 mg in the PM x1 week, and finally 1,000 mg twice daily. Take with food.  Dispense: 120 tablet; Refill: 0 - Take Lantus at night -Stop sliding scale Insulin - Microalbumin / creatinine urine ratio - Ambulatory referral to Optometry  Need for hepatitis C screening test - HCV Ab w Reflex to Quant PCR  Screening for HIV (human immunodeficiency virus) - HIV Antibody (routine testing w rflx)

## 2022-02-14 ENCOUNTER — Telehealth: Payer: Self-pay

## 2022-02-14 DIAGNOSIS — Z114 Encounter for screening for human immunodeficiency virus [HIV]: Secondary | ICD-10-CM | POA: Insufficient documentation

## 2022-02-14 DIAGNOSIS — Z1159 Encounter for screening for other viral diseases: Secondary | ICD-10-CM | POA: Insufficient documentation

## 2022-02-14 DIAGNOSIS — G8929 Other chronic pain: Secondary | ICD-10-CM | POA: Insufficient documentation

## 2022-02-14 NOTE — Assessment & Plan Note (Addendum)
One time screening today. - HCV Ab w Reflex to Quant PCR

## 2022-02-14 NOTE — Assessment & Plan Note (Addendum)
Hx of T2DM on Insulin. Review of hemoglobin A1c shows mostly uncontrolled. Currently on Lantus 50 units and SSI. He has a log of blood glucose for the past 7 days. He has only require a few units of sliding scale insulin on 2 occasions.    Assessment/Plan: T2DM, uncontrolled, on insulin only. Did not tolerate metformin previously. Starting Metformin XR with ramp and instructed to stop at dose he does not have symptoms.  - continue Lantus 50 units QHS - metFORMIN (GLUCOPHAGE-XR) 500 MG 24 hr tablet; Take 500 mg by mouth once daily x1 week, then increase to 500 mg twice daily x1 week, then 1,000 mg in the AM and 500 mg in the PM x1 week, and finally 1,000 mg twice daily. Take with food.  Dispense: 120 tablet; Refill: 0 - Microalbumin / creatinine urine ratio - Ambulatory referral to Optometry - Follow up in 1 month with blood glucose log

## 2022-02-14 NOTE — Assessment & Plan Note (Deleted)
In remission. No feelings of depression today. He is doing Patient is doing well on Zoloft

## 2022-02-14 NOTE — Telephone Encounter (Signed)
Returned call back to Mr Karasik, He called to update that he has transitioned to [facility] and he had his first appointment with Dr. Durwin Nora. He states that went well. He is working now some at [employer] and is continuing to use the [facility] for exercise to keep his blood sugars under control.  Encouraged client to call the Medicaid number that we provided him to inquire about all the services for [facility] and their benefits.  He will continue to call [facility] for any further needs.   Francee Nodal RN [employer]

## 2022-02-14 NOTE — Assessment & Plan Note (Signed)
Patient is in remission. Currently in program , [facility]. Doing well and learning a lot about addiction. Encouraged for his progress and sobriety.

## 2022-02-14 NOTE — Assessment & Plan Note (Addendum)
Patient following with Dr.Keeling for right shoulder pain. Underwent steroid injection and MRI ordered. His insurance has changed and he need referral to Dr.Keeling. Continues to have some pain on exam and exam suggest injury to supraspinatus.   - Ambulatory referral to Orthopedic Surgery - naproxen (NAPROSYN) 500 MG tablet; Take 1 tablet (500 mg total) by mouth 2 (two) times daily with a meal.  Dispense: 20 tablet; Refill: 0

## 2022-02-14 NOTE — Assessment & Plan Note (Signed)
In remission. No feelings of depression today. He is doing Patient is doing well on Zoloft  

## 2022-02-14 NOTE — Assessment & Plan Note (Signed)
BP 124/79 today Chronic problem, controlled Continue Lisinopril 20 mg, Amlodipine 10 mg, HCTZ 25 mg

## 2022-02-14 NOTE — Assessment & Plan Note (Addendum)
One time screening today. - HIV Antibody (routine testing w rflx)

## 2022-02-16 LAB — HIV ANTIBODY (ROUTINE TESTING W REFLEX): HIV Screen 4th Generation wRfx: NONREACTIVE

## 2022-02-16 LAB — HCV INTERPRETATION

## 2022-02-16 LAB — MICROALBUMIN / CREATININE URINE RATIO
Creatinine, Urine: 205.4 mg/dL
Microalb/Creat Ratio: 14 mg/g creat (ref 0–29)
Microalbumin, Urine: 28.6 ug/mL

## 2022-02-16 LAB — HCV AB W REFLEX TO QUANT PCR: HCV Ab: NONREACTIVE

## 2022-02-20 ENCOUNTER — Ambulatory Visit: Payer: Medicaid Other | Admitting: Orthopaedic Surgery

## 2022-02-23 ENCOUNTER — Ambulatory Visit (HOSPITAL_COMMUNITY)
Admission: RE | Admit: 2022-02-23 | Discharge: 2022-02-23 | Disposition: A | Discharge status: Home / Self Care | Payer: Medicaid Other | Source: Ambulatory Visit | Attending: Orthopaedic Surgery | Admitting: Orthopaedic Surgery

## 2022-02-23 DIAGNOSIS — S46001A Unspecified injury of muscle(s) and tendon(s) of the rotator cuff of right shoulder, initial encounter: Secondary | ICD-10-CM

## 2022-02-23 DIAGNOSIS — M25511 Pain in right shoulder: Secondary | ICD-10-CM | POA: Diagnosis present

## 2022-02-23 DIAGNOSIS — G8929 Other chronic pain: Secondary | ICD-10-CM | POA: Diagnosis present

## 2022-02-26 ENCOUNTER — Other Ambulatory Visit: Payer: Self-pay | Admitting: Physician Assistant

## 2022-02-27 ENCOUNTER — Encounter: Payer: Self-pay | Admitting: Orthopaedic Surgery

## 2022-02-27 ENCOUNTER — Ambulatory Visit (INDEPENDENT_AMBULATORY_CARE_PROVIDER_SITE_OTHER): Payer: Medicaid Other | Admitting: Orthopaedic Surgery

## 2022-02-27 VITALS — BP 118/76 | HR 87 | Ht 68.0 in | Wt 197.0 lb

## 2022-02-27 DIAGNOSIS — M25511 Pain in right shoulder: Secondary | ICD-10-CM

## 2022-02-27 DIAGNOSIS — S46001D Unspecified injury of muscle(s) and tendon(s) of the rotator cuff of right shoulder, subsequent encounter: Secondary | ICD-10-CM

## 2022-02-27 DIAGNOSIS — G8929 Other chronic pain: Secondary | ICD-10-CM

## 2022-02-27 MED ORDER — NAPROXEN 500 MG PO TABS: 500.0000 mg | ORAL_TABLET | Freq: Two times a day (BID) | ORAL | 5 refills | Status: AC

## 2022-02-27 NOTE — Patient Instructions (Addendum)
FOLLOW UP AS NEEDED  IF YOU DO AN ACTIVITY AND IT CAUSES PAIN, STOP THAT ACTIVITY OR MODIFY HOW YOU DO IT.  As the weather changes and gets cooler, you may notice you are affected more. You may have more pain in your joints. This is normal. Dress warmly and make sure that area is covered well.   PICK UP YOUR PRESCRIPTION FOR NAPROXEN. THIS MEDICATION CAN TAKE UP TO 10 DAYS TO FULLY GET INTO YOUR SYSTEM. YOU MUST TAKE THIS WITH FOOD TWICE DAILY IF YOU TAKE IT. DON'T DOUBLE UP IF YOU MISS A DOSE.

## 2022-02-27 NOTE — Progress Notes (Signed)
I feel better.  The injection to the right shoulder helped a lot.  He has much less pain. He had MRI of the right shoulder showing: IMPRESSION: 1. 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 No retracted component. 2. Mild-moderate degenerative changes of the [facility] joint.  I have explained the findings to him.  I have independently reviewed the MRI.    I would like him to continue the Naprosyn for a while.  He may need to see Dr. Dallas Schimke if shoulder pain returns.    ROM of the right shoulder is full today.  NV intact.  Encounter Diagnoses  Name Primary?   Chronic right shoulder pain Yes   Rotator cuff injury, right, subsequent encounter    I will see prn.  New refill of Naprosyn called in.  Call if any problem.  Precautions discussed.  Electronically Signed Darreld Mclean, MD 12/19/20232:41 PM

## 2022-03-08 ENCOUNTER — Telehealth: Payer: Self-pay | Admitting: Internal Medicine

## 2022-03-08 NOTE — Telephone Encounter (Signed)
Who/what is this supposed to be calling? His [facility] is [facility] so not sure who this is

## 2022-03-08 NOTE — Telephone Encounter (Signed)
Matilde Haymaker, [phone]   Wants clarification on pt medication. Can you please call when available?

## 2022-03-09 NOTE — Telephone Encounter (Signed)
[facility] is the group home recovery place he is staying

## 2022-03-13 ENCOUNTER — Telehealth: Payer: Self-pay | Admitting: Internal Medicine

## 2022-03-13 NOTE — Telephone Encounter (Signed)
Patient came by the office and said his metformin is making patient feel tired all the time.  Please Contact patient at [phone] today needs to know anything he needs to do.

## 2022-03-13 NOTE — Telephone Encounter (Signed)
Spoke with Alvis Lemmings about medications.

## 2022-03-14 NOTE — Telephone Encounter (Signed)
Appt made for 1/5

## 2022-03-16 ENCOUNTER — Ambulatory Visit (INDEPENDENT_AMBULATORY_CARE_PROVIDER_SITE_OTHER): Payer: Medicaid Other | Admitting: Internal Medicine

## 2022-03-16 ENCOUNTER — Encounter: Payer: Self-pay | Admitting: Internal Medicine

## 2022-03-16 VITALS — BP 130/84 | HR 87 | Ht 68.0 in | Wt 198.8 lb

## 2022-03-16 DIAGNOSIS — E1165 Type 2 diabetes mellitus with hyperglycemia: Secondary | ICD-10-CM

## 2022-03-16 MED ORDER — METFORMIN HCL ER 500 MG PO TB24: 500.0000 mg | ORAL_TABLET | Freq: Two times a day (BID) | ORAL | 1 refills | Status: DC

## 2022-03-16 NOTE — Progress Notes (Signed)
Established Patient Office Visit  Subjective   Patient ID: Troy Gordon, male    DOB: 05-24-1965  Age: 57 y.o. MRN: [id number]  Chief Complaint  Patient presents with   Medication Problem    Patient seems to feel more tired and wants to sleep all the time. Started on 03/10/2022.   Troy Gordon returns to care today for DM follow-up.  He was last seen at [facility] on 02/13/22 by Dr. Court Joy to establish care.  Metformin XR was increased to 1000 mg twice daily at that time.  4-week follow-up was arranged.  Interim he has been seen by orthopedic surgery (Dr. Luna Glasgow) for management of chronic right shoulder pain.  There have otherwise been no acute interval events.  Today Troy Gordon endorses increased fatigue since he increased metformin to 1000 mg twice daily.  When reviewing his medications, he also reports that he was instructed by staff at the facility he is currently living at to increase Lantus to 100 mg nightly.  He has not been checking his blood sugar.  He has no additional concerns to discuss today.  Past Medical History:  Diagnosis Date   Depression    Diabetes mellitus ([place])    Dyslipidemia    Hyperlipidemia    Hypertension    Past Surgical History:  Procedure Laterality Date   CIRCUMCISION     as an adult   Social History   Tobacco Use   Smoking status: Former    Years: 1.00    Types: Cigarettes   Smokeless tobacco: Never   Tobacco comments:    1 cig every 5 weeks  Vaping Use   Vaping Use: Never used  Substance Use Topics   Alcohol use: Not Currently    Comment: occasionally beer   Drug use: Yes    Types: Marijuana, Cocaine    Comment: denies use 08/05/21   Family History  Problem Relation Age of Onset   Diabetes Maternal Grandmother    Hypertension Maternal Grandmother    Hypertension Father    No Known Allergies  Review of Systems  Constitutional:  Positive for malaise/fatigue.     Objective:     BP 130/84   Pulse 87   Ht 5\' 8"  (1.727 m)   Wt 198 lb  12.8 oz (90.2 kg)   SpO2 94%   BMI 30.23 kg/m  BP Readings from Last 3 Encounters:  03/16/22 130/84  02/27/22 118/76  02/13/22 124/79   Physical Exam Vitals reviewed.  Constitutional:      General: He is not in acute distress.    Appearance: Normal appearance. He is not ill-appearing.  HENT:     Head: Normocephalic and atraumatic.     Right Ear: External ear normal.     Left Ear: External ear normal.     Nose: Nose normal. No congestion or rhinorrhea.     Mouth/Throat:     Mouth: Mucous membranes are moist.     Pharynx: Oropharynx is clear.  Eyes:     General: No scleral icterus.    Extraocular Movements: Extraocular movements intact.     Conjunctiva/sclera: Conjunctivae normal.     Pupils: Pupils are equal, round, and reactive to light.  Cardiovascular:     Rate and Rhythm: Normal rate and regular rhythm.     Pulses: Normal pulses.     Heart sounds: Normal heart sounds. No murmur heard. Pulmonary:     Effort: Pulmonary effort is normal.     Breath sounds: Normal breath  sounds. No wheezing, rhonchi or rales.  Abdominal:     General: Abdomen is flat. Bowel sounds are normal. There is no distension.     Palpations: Abdomen is soft.     Tenderness: There is no abdominal tenderness.  Musculoskeletal:        General: No swelling or deformity. Normal range of motion.     Cervical back: Normal range of motion.  Skin:    General: Skin is warm and dry.     Capillary Refill: Capillary refill takes less than 2 seconds.  Neurological:     General: No focal deficit present.     Mental Status: He is alert and oriented to person, place, and time.     Motor: No weakness.  Psychiatric:        Mood and Affect: Mood normal.        Behavior: Behavior normal.        Thought Content: Thought content normal.    Last CBC Lab Results  Component Value Date   WBC 7.1 12/01/2021   HGB 14.2 12/01/2021   HCT 42.6 12/01/2021   MCV 82.9 12/01/2021   MCH 27.6 12/01/2021   RDW 13.3  12/01/2021   PLT 165 [id number]   Last metabolic panel Lab Results  Component Value Date   GLUCOSE 119 (H) 03/19/2022   NA 142 03/19/2022   K 4.1 03/19/2022   CL 103 03/19/2022   CO2 25 03/19/2022   BUN 25 (H) 03/19/2022   CREATININE 1.33 (H) 03/19/2022   GFRNONAA >60 01/02/2022   CALCIUM 9.8 03/19/2022   PROT 7.2 01/02/2022   ALBUMIN 4.0 01/02/2022   BILITOT 0.4 01/02/2022   ALKPHOS 80 01/02/2022   AST 24 01/02/2022   ALT 33 01/02/2022   ANIONGAP 8 01/02/2022   Last lipids Lab Results  Component Value Date   CHOL 165 01/02/2022   HDL 43 01/02/2022   LDLCALC 100 (H) 01/02/2022   TRIG 108 01/02/2022   CHOLHDL 3.8 01/02/2022   Last hemoglobin A1c Lab Results  Component Value Date   HGBA1C 9.9 (H) 01/02/2022   Last thyroid functions Lab Results  Component Value Date   TSH 2.395 03/14/2021   Last vitamin D Lab Results  Component Value Date   VD25OH 41.87 03/13/2021   Last vitamin B12 and Folate Lab Results  Component Value Date   [id number] 03/13/2021   The 10-year ASCVD risk score (Arnett DK, et al., 2019) is: 21.8%    Assessment & Plan:   Problem List Items Addressed This Visit       Uncontrolled type 2 diabetes mellitus with hyperglycemia ([place]) - Primary    His last A1c was 9.9 in October.  He is currently prescribed Lantus 50 units nightly and metformin XR 1000 mg twice daily.  Metformin was increased at his appointment last month.  He reports today that he has also increased Lantus to 100 units nightly.  He endorses symptoms of fatigue recently.  He has not been checking his blood sugar. -I reviewed with Troy Gordon the appropriate medication adjustments from his last appointment.  He was instructed to resume Lantus 50 units nightly and to continue metformin 1000 mg twice daily.   -We will plan for follow-up in 4 weeks.  In the interim I have asked that he keep a blood sugar log to review at his next appointment -His current medication regimen will  be communicated to staff at his facility      Return in about 4  weeks (around 04/13/2022) for DM, repeat labs.    Johnette Abraham, MD

## 2022-03-16 NOTE — Patient Instructions (Signed)
It was a pleasure to see you today.  Thank you for giving [place] the opportunity to be involved in your care.  Below is a brief recap of your visit and next steps.  We will plan to see you again in 4 weeks.  Summary For your diabetes you should be taking the following: Metformin-XR 1000 mg twice daily Lantus 50 units at night Please check your blood sugar each morning and bring a log to review at your next appointment

## 2022-03-19 ENCOUNTER — Telehealth: Payer: Self-pay | Admitting: Internal Medicine

## 2022-03-19 NOTE — Telephone Encounter (Signed)
Returned patient call.

## 2022-03-19 NOTE — Telephone Encounter (Signed)
Pt returning call

## 2022-03-19 NOTE — Telephone Encounter (Signed)
Patient came by the office to get message to Dr Doren Custard his blood sugar readings for Saturday, Sunday and Monday were 77 , 78 and 77. Per patient he will need a letter from the provider to decrease his insulin. Michela Pitcher he was told to decrease if readings are in the upper 70's Please contact patient when letter is ready for pick up to take to [facility]. # [phone].

## 2022-03-20 LAB — BASIC METABOLIC PANEL
BUN/Creatinine Ratio: 19 (ref 9–20)
BUN: 25 mg/dL — ABNORMAL HIGH (ref 6–24)
CO2: 25 mmol/L (ref 20–29)
Calcium: 9.8 mg/dL (ref 8.7–10.2)
Chloride: 103 mmol/L (ref 96–106)
Creatinine, Ser: 1.33 mg/dL — ABNORMAL HIGH (ref 0.76–1.27)
Glucose: 119 mg/dL — ABNORMAL HIGH (ref 70–99)
Potassium: 4.1 mmol/L (ref 3.5–5.2)
Sodium: 142 mmol/L (ref 134–144)
eGFR: 62 mL/min/{1.73_m2} (ref >59)

## 2022-03-21 NOTE — Assessment & Plan Note (Signed)
His last A1c was 9.9 in October.  He is currently prescribed Lantus 50 units nightly and metformin XR 1000 mg twice daily.  Metformin was increased at his appointment last month.  He reports today that he has also increased Lantus to 100 units nightly.  He endorses symptoms of fatigue recently.  He has not been checking his blood sugar. -I reviewed with Troy Gordon the appropriate medication adjustments from his last appointment.  He was instructed to resume Lantus 50 units nightly and to continue metformin 1000 mg twice daily.   -We will plan for follow-up in 4 weeks.  In the interim I have asked that he keep a blood sugar log to review at his next appointment -His current medication regimen will be communicated to staff at his facility

## 2022-04-09 ENCOUNTER — Other Ambulatory Visit: Payer: Self-pay | Admitting: Physician Assistant

## 2022-04-10 ENCOUNTER — Other Ambulatory Visit: Payer: Self-pay

## 2022-04-10 ENCOUNTER — Telehealth: Payer: Self-pay | Admitting: Internal Medicine

## 2022-04-10 ENCOUNTER — Ambulatory Visit: Payer: Medicaid Other | Admitting: Physician Assistant

## 2022-04-10 DIAGNOSIS — E1165 Type 2 diabetes mellitus with hyperglycemia: Secondary | ICD-10-CM

## 2022-04-10 MED ORDER — LISINOPRIL 20 MG PO TABS: 20.0000 mg | ORAL_TABLET | Freq: Every day | ORAL | 0 refills | Status: DC

## 2022-04-10 MED ORDER — AMLODIPINE BESYLATE 10 MG PO TABS: 10.0000 mg | ORAL_TABLET | Freq: Every day | ORAL | 0 refills | Status: DC

## 2022-04-10 MED ORDER — ZOLOFT 50 MG PO TABS: 50.0000 mg | ORAL_TABLET | Freq: Every day | ORAL | 0 refills | Status: DC

## 2022-04-10 MED ORDER — HYDROCHLOROTHIAZIDE 25 MG PO TABS: 25.0000 mg | ORAL_TABLET | Freq: Every day | ORAL | 0 refills | Status: DC

## 2022-04-10 MED ORDER — INSULIN GLARGINE 100 UNIT/ML ~~LOC~~ SOLN: 50.0000 [IU] | Freq: Every day | SUBCUTANEOUS | 1 refills | Status: DC

## 2022-04-10 MED ORDER — METFORMIN HCL ER 500 MG PO TB24: 500.0000 mg | ORAL_TABLET | Freq: Two times a day (BID) | ORAL | 1 refills | Status: DC

## 2022-04-10 MED ORDER — ATORVASTATIN CALCIUM 80 MG PO TABS: 80.0000 mg | ORAL_TABLET | Freq: Every day | ORAL | 0 refills | Status: DC

## 2022-04-10 NOTE — Telephone Encounter (Signed)
Patient is requesting refill on all active meds   amLODipine (NORVASC) 10 MG tablet  atorvastatin (LIPITOR) 80 MG tablet  hydrochlorothiazide (HYDRODIURIL) 25 MG tablet   insulin glargine (LANTUS) 100 UNIT/ML injection  lisinopril (ZESTRIL) 20 MG tablet  metFORMIN (GLUCOPHAGE-XR) 500 MG 24 hr tablet [[id number]]  ZOLOFT 50 MG tablet   [facility] [place]

## 2022-04-10 NOTE — Telephone Encounter (Signed)
Pt returned call

## 2022-04-10 NOTE — Telephone Encounter (Signed)
Pt aware meds sent over.

## 2022-04-10 NOTE — Telephone Encounter (Signed)
Refills sent

## 2022-04-11 ENCOUNTER — Other Ambulatory Visit: Payer: Self-pay | Admitting: Internal Medicine

## 2022-04-11 ENCOUNTER — Telehealth: Payer: Self-pay | Admitting: Internal Medicine

## 2022-04-11 ENCOUNTER — Other Ambulatory Visit: Payer: Self-pay

## 2022-04-11 MED ORDER — LANTUS SOLOSTAR 100 UNIT/ML ~~LOC~~ SOPN: 50.0000 [IU] | PEN_INJECTOR | Freq: Every day | SUBCUTANEOUS | 99 refills | Status: DC

## 2022-04-11 NOTE — Telephone Encounter (Signed)
Pt wants to know if he can get samples of insulin glargine (LANTUS) 100 UNIT/ML injection till he can get it filled?

## 2022-04-11 NOTE — Telephone Encounter (Signed)
Patient called to have insulin in the pens instead it is cheaper  [place], [place] - [address] [place], [place] [address] Phone: [phone]  Fax: [phone] DEA #: --

## 2022-04-11 NOTE — Telephone Encounter (Signed)
Pens sent to pharmacy

## 2022-04-11 NOTE — Telephone Encounter (Signed)
Pens sent in. LVM

## 2022-04-12 ENCOUNTER — Telehealth: Payer: Self-pay | Admitting: Internal Medicine

## 2022-04-12 NOTE — Telephone Encounter (Signed)
[facility] called from [employer] [facility], patient has no insurnace and asking can a generic to ZOLOFT 50 MG tablet  be called into pharmacy. Call [phone]

## 2022-04-13 ENCOUNTER — Encounter: Payer: Self-pay | Admitting: Internal Medicine

## 2022-04-13 ENCOUNTER — Ambulatory Visit: Payer: Medicaid Other | Admitting: Internal Medicine

## 2022-04-13 VITALS — BP 121/79 | HR 101 | Ht 68.0 in | Wt 192.4 lb

## 2022-04-13 DIAGNOSIS — Z794 Long term (current) use of insulin: Secondary | ICD-10-CM

## 2022-04-13 DIAGNOSIS — E1165 Type 2 diabetes mellitus with hyperglycemia: Secondary | ICD-10-CM

## 2022-04-13 DIAGNOSIS — E119 Type 2 diabetes mellitus without complications: Secondary | ICD-10-CM | POA: Diagnosis not present

## 2022-04-13 LAB — POCT GLYCOSYLATED HEMOGLOBIN (HGB A1C): HbA1c, POC (controlled diabetic range): 6.4 % (ref 0.0–7.0)

## 2022-04-13 NOTE — Progress Notes (Unsigned)
Established Patient Office Visit  Subjective   Patient ID: Troy Gordon, male    DOB: 01/13/1966  Age: 57 y.o. MRN: [id number]  Chief Complaint  Patient presents with   Diabetes    Follow up   Troy Gordon returns to care today for DM follow-up.  He was last seen by me on 1/5 for diabetes follow-up.  At that time it was discovered that he had increased Lantus to 100 units nightly in addition to increasing metformin 1000 mg twice daily.  He endorsed increased fatigue at that time.  He was instructed to resume Lantus 50 units nightly and to continue metformin 1000 mg twice daily.  There have been no acute interval events.  Past Medical History:  Diagnosis Date   Depression    Diabetes mellitus ([place])    Dyslipidemia    Hyperlipidemia    Hypertension    Past Surgical History:  Procedure Laterality Date   CIRCUMCISION     as an adult   Social History   Tobacco Use   Smoking status: Former    Years: 1.00    Types: Cigarettes   Smokeless tobacco: Never   Tobacco comments:    1 cig every 5 weeks  Vaping Use   Vaping Use: Never used  Substance Use Topics   Alcohol use: Not Currently    Comment: occasionally beer   Drug use: Yes    Types: Marijuana, Cocaine    Comment: denies use 08/05/21   Family History  Problem Relation Age of Onset   Diabetes Maternal Grandmother    Hypertension Maternal Grandmother    Hypertension Father    No Known Allergies  Review of Systems  Musculoskeletal:  Positive for joint pain (R shoulder pain).      Objective:     BP 121/79   Pulse (!) 101   Ht 5\' 8"  (1.727 m)   Wt 192 lb 6.4 oz (87.3 kg)   SpO2 95%   BMI 29.25 kg/m  BP Readings from Last 3 Encounters:  04/13/22 121/79  03/16/22 130/84  02/27/22 118/76      Physical Exam Vitals reviewed.  Constitutional:      General: He is not in acute distress.    Appearance: Normal appearance. He is not ill-appearing.  HENT:     Head: Normocephalic and atraumatic.     Right  Ear: External ear normal.     Left Ear: External ear normal.     Nose: Nose normal. No congestion or rhinorrhea.     Mouth/Throat:     Mouth: Mucous membranes are moist.     Pharynx: Oropharynx is clear.  Eyes:     General: No scleral icterus.    Extraocular Movements: Extraocular movements intact.     Conjunctiva/sclera: Conjunctivae normal.     Pupils: Pupils are equal, round, and reactive to light.  Cardiovascular:     Rate and Rhythm: Normal rate and regular rhythm.     Pulses: Normal pulses.     Heart sounds: Normal heart sounds. No murmur heard. Pulmonary:     Effort: Pulmonary effort is normal.     Breath sounds: Normal breath sounds. No wheezing, rhonchi or rales.  Abdominal:     General: Abdomen is flat. Bowel sounds are normal. There is no distension.     Palpations: Abdomen is soft.     Tenderness: There is no abdominal tenderness.  Musculoskeletal:        General: No swelling or deformity. Normal range  of motion.     Cervical back: Normal range of motion.  Skin:    General: Skin is warm and dry.     Capillary Refill: Capillary refill takes less than 2 seconds.  Neurological:     General: No focal deficit present.     Mental Status: He is alert and oriented to person, place, and time.     Motor: No weakness.  Psychiatric:        Mood and Affect: Mood normal.        Behavior: Behavior normal.        Thought Content: Thought content normal.      No results found for any visits on 04/13/22.  Last CBC Lab Results  Component Value Date   WBC 7.1 12/01/2021   HGB 14.2 12/01/2021   HCT 42.6 12/01/2021   MCV 82.9 12/01/2021   MCH 27.6 12/01/2021   RDW 13.3 12/01/2021   PLT 165 92/01/9416   Last metabolic panel Lab Results  Component Value Date   GLUCOSE 119 (H) 03/19/2022   NA 142 03/19/2022   K 4.1 03/19/2022   CL 103 03/19/2022   CO2 25 03/19/2022   BUN 25 (H) 03/19/2022   CREATININE 1.33 (H) 03/19/2022   EGFR 62 03/19/2022   CALCIUM 9.8 03/19/2022    PROT 7.2 01/02/2022   ALBUMIN 4.0 01/02/2022   BILITOT 0.4 01/02/2022   ALKPHOS 80 01/02/2022   AST 24 01/02/2022   ALT 33 01/02/2022   ANIONGAP 8 01/02/2022   Last lipids Lab Results  Component Value Date   CHOL 165 01/02/2022   HDL 43 01/02/2022   LDLCALC 100 (H) 01/02/2022   TRIG 108 01/02/2022   CHOLHDL 3.8 01/02/2022   Last hemoglobin A1c Lab Results  Component Value Date   HGBA1C 9.9 (H) 01/02/2022   Last thyroid functions Lab Results  Component Value Date   TSH 2.395 03/14/2021   Last vitamin D Lab Results  Component Value Date   VD25OH 41.87 03/13/2021   Last vitamin B12 and Folate Lab Results  Component Value Date   [id number] 03/13/2021   The 10-year ASCVD risk score (Arnett DK, et al., 2019) is: 19.3%    Assessment & Plan:   Problem List Items Addressed This Visit   None   No follow-ups on file.    Johnette Abraham, MD

## 2022-04-13 NOTE — Patient Instructions (Signed)
It was a pleasure to see you today.  Thank you for giving [place] the opportunity to be involved in your care.  Below is a brief recap of your visit and next steps.  We will plan to see you again in 3 months.  Summary Congratulations on your progress. Keep working down on Lantus. Continue metformin at current dose. We will plan for follow up in 3 months I recommend contacting [facility] for follow up as we discussed

## 2022-04-17 ENCOUNTER — Ambulatory Visit: Payer: Medicaid Other | Admitting: Orthopaedic Surgery

## 2022-04-17 ENCOUNTER — Encounter: Payer: Self-pay | Admitting: Orthopaedic Surgery

## 2022-04-17 VITALS — BP 137/88 | HR 72 | Ht 68.0 in | Wt 192.0 lb

## 2022-04-17 DIAGNOSIS — S46001D Unspecified injury of muscle(s) and tendon(s) of the rotator cuff of right shoulder, subsequent encounter: Secondary | ICD-10-CM | POA: Diagnosis not present

## 2022-04-17 NOTE — Progress Notes (Signed)
My shoulder is hurting more.  He has more pain in the right shoulder with the colder weather. He has reconsidered seeing Dr. Amedeo Kinsman and I will have him see him.  ROM of the right shoulder is good with pain in the extremes.  NV intact. ROM of neck is full  Encounter Diagnosis  Name Primary?   Rotator cuff injury, right, subsequent encounter Yes   I will refill his Naprosyn.  See Dr. Amedeo Kinsman.  Call if any problem.  Precautions discussed.  Electronically Signed Sanjuana Kava, MD 2/6/20243:09 PM

## 2022-04-17 NOTE — Assessment & Plan Note (Signed)
A1c repeated today and has improved to 6.4 from 9.9 previously.  He is currently prescribed metformin XR 1000 mg twice daily and has been able to reduce Lantus to 20 units nightly.  He states that his blood sugar readings over the past 2 weeks have all been less than 100. -He was congratulated on his progress today.  I have instructed him to continue reducing Lantus by 4 units every 2-3 days if AM blood sugar readings are less than 90. -Previously referred to ophthalmology for routine diabetic eye exam.  We will follow-up on the status of this referral.

## 2022-04-17 NOTE — Patient Instructions (Signed)
See Dr.Cairns (our shoulder guy) for your right shoulder

## 2022-05-08 ENCOUNTER — Encounter: Payer: Self-pay | Admitting: Orthopedic Surgery

## 2022-05-08 ENCOUNTER — Ambulatory Visit (INDEPENDENT_AMBULATORY_CARE_PROVIDER_SITE_OTHER): Payer: Medicaid Other | Admitting: Orthopedic Surgery

## 2022-05-08 VITALS — Ht 68.0 in | Wt 192.0 lb

## 2022-05-08 DIAGNOSIS — S46001D Unspecified injury of muscle(s) and tendon(s) of the rotator cuff of right shoulder, subsequent encounter: Secondary | ICD-10-CM

## 2022-05-08 NOTE — Patient Instructions (Signed)

## 2022-05-09 ENCOUNTER — Encounter: Payer: Self-pay | Admitting: Orthopedic Surgery

## 2022-05-09 NOTE — Progress Notes (Signed)
New Patient Visit  Assessment: Troy Gordon is a 57 y.o. male with the following: 1. Rotator cuff injury, right, subsequent encounter  Plan: Troy Gordon has pain in his right shoulder.  This started after a fall.  He has had injections in the past, which have provided good relief of his symptoms.  However, he has not had complete resolution.  MRI demonstrates an interstitial tear of the rotator cuff, without atrophy or displacement.  He also has a large cystic mass in the humeral head.  This is measuring approximately 2 x 1.3 cm, and is just below the footprint of the rotator cuff.  At this point, it is unclear if it is the tendinous injury, or the large cyst which is causing his discomfort.  We briefly discussed these issues, and the possibility of proceeding with surgery.  He would like to avoid surgery if possible.  We also discussed repeating an injection, he would like to proceed.  He will follow-up as needed.  We can discuss this potential procedure in more detail at a later date.   Procedure note injection - Right shoulder    Verbal consent was obtained to inject the right shoulder, subacromial space Timeout was completed to confirm the site of injection.   The skin was prepped with alcohol and ethyl chloride was sprayed at the injection site.  A 21-gauge needle was used to inject 40 mg of Depo-Medrol and 1% lidocaine (3 cc) into the subacromial space of the right shoulder using a posterolateral approach.  There were no complications.  A sterile bandage was applied.    Follow-up: Return if symptoms worsen or fail to improve.  Subjective:  Chief Complaint  Patient presents with   Shoulder Pain    Rt shoulder pain, pt states he fell over a yr ago and but didn't feel pain until 3-4 wks later. Pt states the injection he received in the shoulder helped but still has pain with certain movements.     History of Present Illness: Troy Gordon is a 57 y.o. male who presents  for evaluation of.  He has previously been worked up by my partner, Dr. Luna Glasgow.  He has had right shoulder pain for over a year.  He reports falling directly onto the right shoulder.  This has restricted his motion.  He had an injection over 3 months ago, which improved greater than 80% of the symptoms.  However, he is starting to have more symptoms at this time.  He has not worked with physical therapy.  Occasional medications.   Review of Systems: No fevers or chills No numbness or tingling No chest pain No shortness of breath No bowel or bladder dysfunction No GI distress No headaches   Medical History:  Past Medical History:  Diagnosis Date   Depression    Diabetes mellitus ([place])    Dyslipidemia    Hyperlipidemia    Hypertension     Past Surgical History:  Procedure Laterality Date   CIRCUMCISION     as an adult    Family History  Problem Relation Age of Onset   Diabetes Maternal Grandmother    Hypertension Maternal Grandmother    Hypertension Father    Social History   Tobacco Use   Smoking status: Former    Years: 1.00    Types: Cigarettes   Smokeless tobacco: Never   Tobacco comments:    1 cig every 5 weeks  Vaping Use   Vaping Use: Never used  Substance  Use Topics   Alcohol use: Not Currently    Comment: occasionally beer   Drug use: Yes    Types: Marijuana, Cocaine    Comment: denies use 08/05/21    No Known Allergies  No outpatient medications have been marked as taking for the 05/08/22 encounter (Office Visit) with Mordecai Rasmussen, MD.    Objective: Ht '5\' 8"'$  (1.727 m)   Wt 192 lb (87.1 kg)   BMI 29.19 kg/m   Physical Exam:  General: Alert and oriented. and No acute distress. Gait: Normal gait.  Right shoulder without deformity.  Tenderness palpation over the anterior lateral aspect of the shoulder.  Good range of motion.  Slightly restricted internal rotation.  Fingers are warm and well-perfused.  Sensation is intact throughout the right  hand.  IMAGING: I personally reviewed images previously obtained in clinic  Right shoulder MRI IMPRESSION: 1. 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 No retracted component. 2. Mild-moderate degenerative changes of the [facility] joint.  New Medications:  No orders of the defined types were placed in this encounter.     Mordecai Rasmussen, MD  05/09/2022 1:54 PM

## 2022-05-10 ENCOUNTER — Telehealth: Payer: Self-pay | Admitting: Orthopedic Surgery

## 2022-05-10 ENCOUNTER — Encounter: Payer: Self-pay | Admitting: Radiology

## 2022-05-10 NOTE — Telephone Encounter (Signed)
Alvis Lemmings w/[facility] [phone] called, he has questions regarding the patient's prescription for Naproxen '500mg'$ .

## 2022-05-15 ENCOUNTER — Telehealth: Payer: Self-pay | Admitting: Internal Medicine

## 2022-05-15 NOTE — Telephone Encounter (Signed)
Pt called stating at his last ov he was told to take metFORMIN (GLUCOPHAGE-XR) 500 MG 24 hr tablet 500 mg 2x daily. New rx says take 1 tablet everyother day. Wants clarification please?

## 2022-05-15 NOTE — Telephone Encounter (Signed)
Patient advised.

## 2022-05-16 NOTE — Telephone Encounter (Signed)
The facility pt is in needs a letter to d/c, naprosyn. Letter completed and faxed.

## 2022-06-13 LAB — HM DIABETES EYE EXAM

## 2022-06-18 ENCOUNTER — Telehealth: Payer: Self-pay

## 2022-06-18 NOTE — Telephone Encounter (Signed)
Troy Gordon former [facility] client who transitioned to [facility], called to provide an update. He is doing well and has stepped up to the second fhase at [facility]. 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  He states his blood sugars have been doing really well and has been able to decrease some of his medications. His most recent A1C by Dr Durwin Nora was reported as 6.4% on 04/13/22 Dr Dixon's notes. He has continued to utilize the [facility] through a [employer] assisted with for 1 year, his year will end 07/24/22 through our program. Congratulated Mr. Eliassen on his hard work and his accomplishments and encouraged him to continue his path to better health and well-being. He denies any needs that I may assist him with at this time, he will continue to update.   Francee Nodal RN [employer]

## 2022-07-12 ENCOUNTER — Ambulatory Visit: Payer: Medicaid Other | Admitting: Internal Medicine

## 2022-08-15 ENCOUNTER — Telehealth: Payer: Self-pay

## 2022-08-15 NOTE — Telephone Encounter (Signed)
Previous [facility] client who has transitioned to [facility], he has been being followed for Diabetes. Troy Gordon has established care with  Primary, completed rehab at [facility] and had been working at [employer].  Last A1C reported 6.4 on 04/13/22  PCP: Dr. [facility]. Financial : working as Agricultural consultant. Transportation: shared he was able to get himself a car after saving from his work at [employer]. Support: Had last shared he was getting support from his church and friends he had made there. 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 He was being treated for depression and SUD in [facility] program. No food insecurity currently.  Called today to check in on his transition with Medicaid and his Diabetes. No answer, left Voicemail requesting return call.  Francee Nodal RN [employer]

## 2022-08-21 ENCOUNTER — Telehealth: Payer: Self-pay

## 2022-08-21 NOTE — Telephone Encounter (Signed)
Attempted call for a check in on [facility] client who has Diabetes and has transitioned to Medicaid.   No answer, left message requesting return call.   Francee Nodal RN [employer]

## 2022-08-29 ENCOUNTER — Telehealth: Payer: Self-pay

## 2022-08-29 NOTE — Telephone Encounter (Signed)
Mr. Krenke [facility] client who has transitioned to [facility] replied via text message to calls and text for a check in. He briefly states he is doing okay and was just getting his phone on again, it needed a new sims card.  Will continue to attempt to check in with Mr. Yambao as he transitions to follow his diabetes. He established Primary care with [facility], no upcoming appointments showing currently.  Will encourage him to call to schedule a follow up.    Francee Nodal RN [employer]

## 2022-09-05 ENCOUNTER — Telehealth: Payer: Self-pay | Admitting: Internal Medicine

## 2022-09-05 ENCOUNTER — Other Ambulatory Visit: Payer: Self-pay

## 2022-09-05 ENCOUNTER — Telehealth: Payer: Self-pay

## 2022-09-05 DIAGNOSIS — E1165 Type 2 diabetes mellitus with hyperglycemia: Secondary | ICD-10-CM

## 2022-09-05 MED ORDER — AMLODIPINE BESYLATE 10 MG PO TABS: 10.0000 mg | ORAL_TABLET | Freq: Every day | ORAL | 0 refills | Status: DC

## 2022-09-05 MED ORDER — METFORMIN HCL ER 500 MG PO TB24: 500.0000 mg | ORAL_TABLET | Freq: Two times a day (BID) | ORAL | 1 refills | Status: DC

## 2022-09-05 MED ORDER — LANTUS SOLOSTAR 100 UNIT/ML ~~LOC~~ SOPN: 50.0000 [IU] | PEN_INJECTOR | Freq: Every day | SUBCUTANEOUS | 99 refills | Status: DC

## 2022-09-05 MED ORDER — ATORVASTATIN CALCIUM 80 MG PO TABS: 80.0000 mg | ORAL_TABLET | Freq: Every day | ORAL | 0 refills | Status: DC

## 2022-09-05 MED ORDER — LISINOPRIL 20 MG PO TABS: 20.0000 mg | ORAL_TABLET | Freq: Every day | ORAL | 0 refills | Status: DC

## 2022-09-05 MED ORDER — HYDROCHLOROTHIAZIDE 25 MG PO TABS: 25.0000 mg | ORAL_TABLET | Freq: Every day | ORAL | 0 refills | Status: DC

## 2022-09-05 NOTE — Telephone Encounter (Signed)
Patient called asked can he get refills up until his next appointment 07.17.2024  insulin glargine (LANTUS SOLOSTAR) 100 UNIT/ML Solostar Pen [[id number]]   atorvastatin (LIPITOR) 80 MG tablet [[id number]]   amLODipine (NORVASC) 10 MG tablet [[id number]]   lisinopril (ZESTRIL) 20 MG tablet [[id number]]   metFORMIN (GLUCOPHAGE-XR) 500 MG 24 hr tablet [[id number]]   hydrochlorothiazide (HYDRODIURIL) 25 MG tablet [[id number]]   ZOLOFT 50 MG tablet [[id number]   Pharmacy  [facility] [address], [place] - [address], [place] [address] Phone: [phone]  Fax: [phone] DEA #: --

## 2022-09-05 NOTE — Telephone Encounter (Signed)
Text message sent to Mr Revolorio who transitioned from [facility] to medicaid to be sure to reschedule his appointment with his primary care office for his Diabetic follow up. Text sent as Mr Edgington is at work.    Francee Nodal RN [employer]

## 2022-09-06 NOTE — Telephone Encounter (Signed)
Tried calling both numbers on filled and was unable to get in touch with patient about medications.

## 2022-09-27 ENCOUNTER — Ambulatory Visit (INDEPENDENT_AMBULATORY_CARE_PROVIDER_SITE_OTHER): Payer: MEDICAID | Admitting: Internal Medicine

## 2022-09-27 ENCOUNTER — Encounter: Payer: Self-pay | Admitting: Internal Medicine

## 2022-09-27 VITALS — BP 160/99 | HR 76 | Ht 68.0 in | Wt 181.4 lb

## 2022-09-27 DIAGNOSIS — Z1211 Encounter for screening for malignant neoplasm of colon: Secondary | ICD-10-CM

## 2022-09-27 DIAGNOSIS — E1165 Type 2 diabetes mellitus with hyperglycemia: Secondary | ICD-10-CM | POA: Diagnosis not present

## 2022-09-27 DIAGNOSIS — I10 Essential (primary) hypertension: Secondary | ICD-10-CM | POA: Diagnosis not present

## 2022-09-27 DIAGNOSIS — E1169 Type 2 diabetes mellitus with other specified complication: Secondary | ICD-10-CM | POA: Diagnosis not present

## 2022-09-27 DIAGNOSIS — N529 Male erectile dysfunction, unspecified: Secondary | ICD-10-CM | POA: Insufficient documentation

## 2022-09-27 DIAGNOSIS — N521 Erectile dysfunction due to diseases classified elsewhere: Secondary | ICD-10-CM

## 2022-09-27 MED ORDER — SILDENAFIL CITRATE 50 MG PO TABS: 50.0000 mg | ORAL_TABLET | Freq: Every day | ORAL | 0 refills | Status: DC | PRN

## 2022-09-27 MED ORDER — METFORMIN HCL ER 500 MG PO TB24
1000.0000 mg | ORAL_TABLET | Freq: Two times a day (BID) | ORAL | 1 refills | Status: DC
Start: 2022-09-27 — End: 2023-05-21

## 2022-09-27 MED ORDER — LISINOPRIL 20 MG PO TABS
20.0000 mg | ORAL_TABLET | Freq: Every day | ORAL | 1 refills | Status: DC
Start: 2022-09-27 — End: 2023-05-21

## 2022-09-27 NOTE — Assessment & Plan Note (Signed)
BP is significantly elevated today, 172/99 initially and 160/99 on repeat.  His current antihypertensive regimen consists of amlodipine 10 mg daily, HCTZ 25 mg daily, and lisinopril 20 mg daily.  He has previously been well-controlled on this regimen. -No medication changes have made today.  We will tenably plan for follow-up for BP check in 4 weeks.  I have requested that he check and record his blood pressure readings at home in the interim.  If his blood pressure remains elevated at follow-up, consider increasing lisinopril to 40 mg daily.

## 2022-09-27 NOTE — Assessment & Plan Note (Signed)
Cologuard ordered today °

## 2022-09-27 NOTE — Assessment & Plan Note (Signed)
A1c 6.4 on labs from February.  He reports that he is currently taking metformin XR 500 mg twice daily and Lantus 30 units nightly.  At his last appointment he was taking metformin XR 1000 mg twice daily and Lantus 20 units nightly.  He reports that there was a discrepancy in the interim regarding the appropriate doses to take. -Today we clarified that he should take metformin XR 1000 mg twice daily.  I have recommended that he gradually reduce Lantus and intervals of 4 units every 2-3 days if morning blood sugar readings remain within goal.  He is in agreement with this plan as he would like to titrate himself off of insulin.  He reports that morning blood sugar readings have been well within goal recently. -Diabetic foot exam completed today

## 2022-09-27 NOTE — Assessment & Plan Note (Signed)
His acute concern today is erectile dysfunction as he has difficulty both achieving and maintaining erections. -Sildenafil 50 mg daily as needed prescribed for treatment of ED.  We also discussed that maintaining adequate control of his chronic medical conditions will improve his symptoms.

## 2022-09-27 NOTE — Patient Instructions (Signed)
It was a pleasure to see you today.  Thank you for giving [place] the opportunity to be involved in your care.  Below is a brief recap of your visit and next steps.  We will plan to see you again in 4 weeks.  Summary Increase Metformin back to 1000 mg twice daily Gradually reduce Lantus Sildenafil added for ED Cologuard ordered Follow up in 4 weeks for BP check and diabetes

## 2022-09-27 NOTE — Progress Notes (Signed)
Established Patient Office Visit  Subjective   Patient ID: Troy Gordon, male    DOB: 16-Feb-1966  Age: 57 y.o. MRN: [id number]  Chief Complaint  Patient presents with   Diabetes    Follow up   Troy Gordon returns to care today for routine follow-up.  He was last evaluated by me on 2/2.  At that time his A1c had improved to 6.4 from 9.9 previously.  He was congratulated on his progress and instructed to reduce Lantus by 4 units every 2-3 days if his morning blood sugar readings were less than 90.  There have been no acute interval events.  Troy Gordon reports feeling well today.  He is asymptomatic and has no acute concerns to discuss.  Past Medical History:  Diagnosis Date   Depression    Diabetes mellitus (HCC)    Dyslipidemia    Hyperlipidemia    Hypertension    Past Surgical History:  Procedure Laterality Date   CIRCUMCISION     as an adult   Social History   Tobacco Use   Smoking status: Former    Types: Cigarettes   Smokeless tobacco: Never   Tobacco comments:    1 cig every 5 weeks  Vaping Use   Vaping status: Never Used  Substance Use Topics   Alcohol use: Not Currently    Comment: occasionally beer   Drug use: Yes    Types: Marijuana, Cocaine    Comment: denies use 08/05/21   Family History  Problem Relation Age of Onset   Diabetes Maternal Grandmother    Hypertension Maternal Grandmother    Hypertension Father    No Known Allergies  Review of Systems  Constitutional:  Negative for chills and fever.  HENT:  Negative for sore throat.   Respiratory:  Negative for cough and shortness of breath.   Cardiovascular:  Negative for chest pain, palpitations and leg swelling.  Gastrointestinal:  Negative for abdominal pain, blood in stool, constipation, diarrhea, nausea and vomiting.  Genitourinary:  Negative for dysuria and hematuria.  Musculoskeletal:  Negative for myalgias.  Skin:  Negative for itching and rash.  Neurological:  Negative for dizziness and  headaches.  Psychiatric/Behavioral:  Negative for depression and suicidal ideas.      Objective:     BP (!) 160/99   Pulse 76   Ht 5\' 8"  (1.727 m)   Wt 181 lb 6.4 oz (82.3 kg)   SpO2 95%   BMI 27.58 kg/m  BP Readings from Last 3 Encounters:  09/27/22 (!) 160/99  04/17/22 137/88  04/13/22 121/79   Physical Exam Vitals reviewed.  Constitutional:      General: He is not in acute distress.    Appearance: Normal appearance. He is not ill-appearing.  HENT:     Head: Normocephalic and atraumatic.     Right Ear: External ear normal.     Left Ear: External ear normal.     Nose: Nose normal. No congestion or rhinorrhea.     Mouth/Throat:     Mouth: Mucous membranes are moist.     Pharynx: Oropharynx is clear.  Eyes:     General: No scleral icterus.    Extraocular Movements: Extraocular movements intact.     Conjunctiva/sclera: Conjunctivae normal.     Pupils: Pupils are equal, round, and reactive to light.  Cardiovascular:     Rate and Rhythm: Normal rate and regular rhythm.     Pulses: Normal pulses.     Heart sounds: Normal heart  sounds. No murmur heard. Pulmonary:     Effort: Pulmonary effort is normal.     Breath sounds: Normal breath sounds. No wheezing, rhonchi or rales.  Abdominal:     General: Abdomen is flat. Bowel sounds are normal. There is no distension.     Palpations: Abdomen is soft.     Tenderness: There is no abdominal tenderness.  Musculoskeletal:        General: No swelling or deformity.     Cervical back: Normal range of motion.  Skin:    General: Skin is warm and dry.     Capillary Refill: Capillary refill takes less than 2 seconds.  Neurological:     General: No focal deficit present.     Mental Status: He is alert and oriented to person, place, and time.     Motor: No weakness.  Psychiatric:        Mood and Affect: Mood normal.        Behavior: Behavior normal.        Thought Content: Thought content normal.   Diabetic foot exam was  performed.  No deformities or other abnormal visual findings.  Posterior tibialis and dorsalis pulse intact bilaterally.  Intact to touch and monofilament testing bilaterally.   Last CBC Lab Results  Component Value Date   WBC 7.1 12/01/2021   HGB 14.2 12/01/2021   HCT 42.6 12/01/2021   MCV 82.9 12/01/2021   MCH 27.6 12/01/2021   RDW 13.3 12/01/2021   PLT 165 12/01/2021   Last metabolic panel Lab Results  Component Value Date   GLUCOSE 119 (H) 03/19/2022   NA 142 03/19/2022   K 4.1 03/19/2022   CL 103 03/19/2022   CO2 25 03/19/2022   BUN 25 (H) 03/19/2022   CREATININE 1.33 (H) 03/19/2022   EGFR 62 03/19/2022   CALCIUM 9.8 03/19/2022   PROT 7.2 01/02/2022   ALBUMIN 4.0 01/02/2022   BILITOT 0.4 01/02/2022   ALKPHOS 80 01/02/2022   AST 24 01/02/2022   ALT 33 01/02/2022   ANIONGAP 8 01/02/2022   Last lipids Lab Results  Component Value Date   CHOL 165 01/02/2022   HDL 43 01/02/2022   LDLCALC 100 (H) 01/02/2022   TRIG 108 01/02/2022   CHOLHDL 3.8 01/02/2022   Last hemoglobin A1c Lab Results  Component Value Date   HGBA1C 6.4 04/13/2022   Last thyroid functions Lab Results  Component Value Date   TSH 2.395 03/14/2021   Last vitamin D Lab Results  Component Value Date   VD25OH 41.87 03/13/2021   Last vitamin B12 and Folate Lab Results  Component Value Date   VITAMINB12 473 03/13/2021   The 10-year ASCVD risk score (Arnett DK, et al., 2019) is: 31.8%    Assessment & Plan:   Problem List Items Addressed This Visit       Benign essential HTN    BP is significantly elevated today, 172/99 initially and 160/99 on repeat.  His current antihypertensive regimen consists of amlodipine 10 mg daily, HCTZ 25 mg daily, and lisinopril 20 mg daily.  He has previously been well-controlled on this regimen. -No medication changes have made today.  We will tenably plan for follow-up for BP check in 4 weeks.  I have requested that he check and record his blood pressure  readings at home in the interim.  If his blood pressure remains elevated at follow-up, consider increasing lisinopril to 40 mg daily.      Uncontrolled type 2 diabetes mellitus with  hyperglycemia (HCC)    A1c 6.4 on labs from February.  He reports that he is currently taking metformin XR 500 mg twice daily and Lantus 30 units nightly.  At his last appointment he was taking metformin XR 1000 mg twice daily and Lantus 20 units nightly.  He reports that there was a discrepancy in the interim regarding the appropriate doses to take. -Today we clarified that he should take metformin XR 1000 mg twice daily.  I have recommended that he gradually reduce Lantus and intervals of 4 units every 2-3 days if morning blood sugar readings remain within goal.  He is in agreement with this plan as he would like to titrate himself off of insulin.  He reports that morning blood sugar readings have been well within goal recently. -Diabetic foot exam completed today      Colon cancer screening    Cologuard ordered today      Erectile dysfunction    His acute concern today is erectile dysfunction as he has difficulty both achieving and maintaining erections. -Sildenafil 50 mg daily as needed prescribed for treatment of ED.  We also discussed that maintaining adequate control of his chronic medical conditions will improve his symptoms.      Return in about 4 weeks (around 10/25/2022) for HTN, DM.   Billie Lade, MD

## 2022-10-23 ENCOUNTER — Telehealth: Payer: Self-pay | Admitting: Internal Medicine

## 2022-10-23 NOTE — Telephone Encounter (Signed)
Patient is calling states his medication was not covered by his insurance and the pharmacy told him to call and discuss why and/or other options. Please advise Thank you

## 2022-10-23 NOTE — Telephone Encounter (Signed)
Spoke with pharmacy and patient needs to contact medicaid about his ins.

## 2022-10-25 ENCOUNTER — Ambulatory Visit: Payer: MEDICAID | Admitting: Internal Medicine

## 2022-10-30 ENCOUNTER — Telehealth: Payer: Self-pay | Admitting: Internal Medicine

## 2022-10-30 NOTE — Telephone Encounter (Signed)
Patient called asked to check with Medicare to make sure insurance will cover his medicine.  Patient going out of town to [place] and will need refills   metFORMIN (GLUCOPHAGE-XR) 500 MG 24 hr tablet [[id number]]   insulin glargine (LANTUS SOLOSTAR) 100 UNIT/ML Solostar Pen [[id number]]   amLODipine (NORVASC) 10 MG tablet [[id number]]   lisinopril (ZESTRIL) 20 MG tablet [[id number]]   Pharmacy  [facility] [address], [place] - [address], [place] [address] Phone: [phone]  Fax: [phone] DEA #: --

## 2022-10-30 NOTE — Telephone Encounter (Signed)
Lvm

## 2022-12-19 ENCOUNTER — Ambulatory Visit: Payer: 59 | Admitting: Internal Medicine

## 2022-12-25 ENCOUNTER — Encounter: Payer: Self-pay | Admitting: Internal Medicine

## 2023-01-16 ENCOUNTER — Ambulatory Visit: Payer: 59 | Admitting: Internal Medicine

## 2023-02-19 ENCOUNTER — Ambulatory Visit: Payer: 59 | Admitting: Internal Medicine

## 2023-04-05 ENCOUNTER — Ambulatory Visit: Payer: Self-pay | Admitting: Internal Medicine

## 2023-04-10 ENCOUNTER — Encounter: Payer: Self-pay | Admitting: Internal Medicine

## 2023-04-14 ENCOUNTER — Emergency Department (HOSPITAL_COMMUNITY)
Admission: EM | Admit: 2023-04-14 | Discharge: 2023-04-15 | Disposition: A | Discharge status: Home / Self Care | Payer: MEDICAID | Attending: Emergency Medicine | Admitting: Emergency Medicine

## 2023-04-14 ENCOUNTER — Other Ambulatory Visit: Payer: Self-pay

## 2023-04-14 ENCOUNTER — Encounter (HOSPITAL_COMMUNITY): Payer: Self-pay | Admitting: *Deleted

## 2023-04-14 DIAGNOSIS — R45851 Suicidal ideations: Secondary | ICD-10-CM

## 2023-04-14 DIAGNOSIS — Z79899 Other long term (current) drug therapy: Secondary | ICD-10-CM | POA: Insufficient documentation

## 2023-04-14 DIAGNOSIS — I10 Essential (primary) hypertension: Secondary | ICD-10-CM | POA: Diagnosis not present

## 2023-04-14 DIAGNOSIS — Z7984 Long term (current) use of oral hypoglycemic drugs: Secondary | ICD-10-CM | POA: Insufficient documentation

## 2023-04-14 DIAGNOSIS — F332 Major depressive disorder, recurrent severe without psychotic features: Secondary | ICD-10-CM | POA: Diagnosis present

## 2023-04-14 DIAGNOSIS — E119 Type 2 diabetes mellitus without complications: Secondary | ICD-10-CM | POA: Diagnosis not present

## 2023-04-14 DIAGNOSIS — Y900 Blood alcohol level of less than 20 mg/100 ml: Secondary | ICD-10-CM | POA: Insufficient documentation

## 2023-04-14 DIAGNOSIS — F1420 Cocaine dependence, uncomplicated: Secondary | ICD-10-CM | POA: Diagnosis present

## 2023-04-14 LAB — RAPID URINE DRUG SCREEN, HOSP PERFORMED
Amphetamines: NOT DETECTED
Barbiturates: NOT DETECTED
Benzodiazepines: NOT DETECTED
Cocaine: POSITIVE — AB
Opiates: NOT DETECTED
Tetrahydrocannabinol: NOT DETECTED

## 2023-04-14 LAB — CBC WITH DIFFERENTIAL/PLATELET
Abs Immature Granulocytes: 0.02 10*3/uL (ref 0.00–0.07)
Basophils Absolute: 0 10*3/uL (ref 0.0–0.1)
Basophils Relative: 1 %
Eosinophils Absolute: 0.1 10*3/uL (ref 0.0–0.5)
Eosinophils Relative: 1 %
HCT: 44.8 % (ref 39.0–52.0)
Hemoglobin: 15.2 g/dL (ref 13.0–17.0)
Immature Granulocytes: 0 %
Lymphocytes Relative: 33 %
Lymphs Abs: 2.4 10*3/uL (ref 0.7–4.0)
MCH: 27.7 pg (ref 26.0–34.0)
MCHC: 33.9 g/dL (ref 30.0–36.0)
MCV: 81.6 fL (ref 80.0–100.0)
Monocytes Absolute: 0.5 10*3/uL (ref 0.1–1.0)
Monocytes Relative: 7 %
Neutro Abs: 4.3 10*3/uL (ref 1.7–7.7)
Neutrophils Relative %: 58 %
Platelets: 192 10*3/uL (ref 150–400)
RBC: 5.49 MIL/uL (ref 4.22–5.81)
RDW: 13 % (ref 11.5–15.5)
WBC: 7.3 10*3/uL (ref 4.0–10.5)
nRBC: 0 % (ref 0.0–0.2)

## 2023-04-14 LAB — COMPREHENSIVE METABOLIC PANEL
ALT: 18 U/L (ref 0–44)
AST: 18 U/L (ref 15–41)
Albumin: 3.8 g/dL (ref 3.5–5.0)
Alkaline Phosphatase: 60 U/L (ref 38–126)
Anion gap: 10 (ref 5–15)
BUN: 20 mg/dL (ref 6–20)
CO2: 28 mmol/L (ref 22–32)
Calcium: 9.4 mg/dL (ref 8.9–10.3)
Chloride: 95 mmol/L — ABNORMAL LOW (ref 98–111)
Creatinine, Ser: 1.65 mg/dL — ABNORMAL HIGH (ref 0.61–1.24)
GFR, Estimated: 48 mL/min — ABNORMAL LOW (ref >60)
Glucose, Bld: 428 mg/dL — ABNORMAL HIGH (ref 70–99)
Potassium: 4.6 mmol/L (ref 3.5–5.1)
Sodium: 133 mmol/L — ABNORMAL LOW (ref 135–145)
Total Bilirubin: 0.8 mg/dL (ref 0.0–1.2)
Total Protein: 7 g/dL (ref 6.5–8.1)

## 2023-04-14 LAB — ETHANOL: Alcohol, Ethyl (B): <10 mg/dL (ref <10)

## 2023-04-14 LAB — CBG MONITORING, ED: Glucose-Capillary: 267 mg/dL — ABNORMAL HIGH (ref 70–99)

## 2023-04-14 LAB — TROPONIN I (HIGH SENSITIVITY): Troponin I (High Sensitivity): 13 ng/L (ref <18)

## 2023-04-14 MED ORDER — INSULIN ASPART 100 UNIT/ML IJ SOLN
0.0000 [IU] | Freq: Three times a day (TID) | INTRAMUSCULAR | Status: DC
Start: 2023-04-15 — End: 2023-04-15

## 2023-04-14 MED ORDER — METFORMIN HCL ER 500 MG PO TB24
1000.0000 mg | ORAL_TABLET | Freq: Two times a day (BID) | ORAL | Status: DC
  Administered 2023-04-14: 1000 mg via ORAL
  Filled 2023-04-14 (×3): qty 2

## 2023-04-14 MED ORDER — ATORVASTATIN CALCIUM 40 MG PO TABS
80.0000 mg | ORAL_TABLET | Freq: Every day | ORAL | Status: DC
  Administered 2023-04-14: 80 mg via ORAL
  Filled 2023-04-14: qty 2

## 2023-04-14 MED ORDER — LISINOPRIL 10 MG PO TABS
20.0000 mg | ORAL_TABLET | Freq: Every day | ORAL | Status: DC
  Administered 2023-04-14: 20 mg via ORAL
  Filled 2023-04-14: qty 2

## 2023-04-14 MED ORDER — SERTRALINE HCL 50 MG PO TABS
50.0000 mg | ORAL_TABLET | Freq: Every day | ORAL | Status: DC
  Administered 2023-04-14: 50 mg via ORAL
  Filled 2023-04-14: qty 1

## 2023-04-14 MED ORDER — INSULIN ASPART 100 UNIT/ML IV SOLN
10.0000 [IU] | Freq: Once | INTRAVENOUS | Status: AC
  Administered 2023-04-14: 10 [IU] via SUBCUTANEOUS

## 2023-04-14 MED ORDER — HYDROCHLOROTHIAZIDE 25 MG PO TABS
25.0000 mg | ORAL_TABLET | Freq: Every day | ORAL | Status: DC
  Administered 2023-04-14: 25 mg via ORAL
  Filled 2023-04-14: qty 1

## 2023-04-14 MED ORDER — ZOLOFT 50 MG PO TABS: 25.0000 mg | ORAL_TABLET | Freq: Every day | ORAL | 0 refills | Status: AC

## 2023-04-14 MED ORDER — AMLODIPINE BESYLATE 5 MG PO TABS
10.0000 mg | ORAL_TABLET | Freq: Every day | ORAL | Status: DC
  Administered 2023-04-14: 10 mg via ORAL
  Filled 2023-04-14: qty 2

## 2023-04-14 NOTE — ED Notes (Signed)
Tele psych at the bedside.

## 2023-04-14 NOTE — ED Provider Notes (Signed)
Psychiatry consult appreciated, patient safe for discharge, will give prescription for Zoloft.   Dione Booze, MD 04/14/23 [phone]

## 2023-04-14 NOTE — ED Provider Notes (Signed)
EMERGENCY DEPARTMENT AT [facility] Provider Note   CSN: [id number] Arrival date & time: 04/14/23  1557     History  Chief Complaint  Patient presents with   V70.1    Troy Gordon is a 58 y.o. male.  HPI   This patient is a 58 year old male history of hypertension, diabetes and a history of depression on Zoloft in the past.  Unfortunately he also has a history of substance abuse disorder and uses cocaine somewhat frequently.  Review of the medical record shows that the patient had a psychiatric admission in September 2023 because of depression and suicidal ideations, he was transferred to an outside psychiatric facility and states that he felt like that was a beneficial admission for him.  Over the last year the patient has been employed, he lost his job in December just before Christmas when he decided to quit because of the stress of the job.  For the last 5 weeks he has been unemployed, having increasing stressors partially related to cocaine use and stating that "we did on the street you have problems with people that are using drugs".  He reports having a feeling of wanting to hurt others involved in these activities and a feeling to want hurt himself because he is tired of life, tired of feeling depressed and feels like there is nothing left to live for.  He is currently homeless but staying at a friend's house as he has been kicked out of his house and is losing his primary residence.  He denies hallucinations during the day but states that when he uses drugs he sometimes hears voices, his last use of cocaine was 2 days ago  Home Medications Prior to Admission medications   Medication Sig Start Date End Date Taking? Authorizing Provider  acetaminophen (TYLENOL) 500 MG tablet Take 500 mg by mouth every 6 (six) hours as needed.    [provider]  amLODipine (NORVASC) 10 MG tablet Take 1 tablet (10 mg total) by mouth daily. 09/05/22   Billie Lade, MD   atorvastatin (LIPITOR) 80 MG tablet Take 1 tablet (80 mg total) by mouth daily. 09/05/22   Billie Lade, MD  hydrochlorothiazide (HYDRODIURIL) 25 MG tablet Take 1 tablet (25 mg total) by mouth daily. 09/05/22   Billie Lade, MD  insulin glargine (LANTUS SOLOSTAR) 100 UNIT/ML Solostar Pen Inject 50 Units into the skin at bedtime. 09/05/22   Billie Lade, MD  lisinopril (ZESTRIL) 20 MG tablet Take 1 tablet (20 mg total) by mouth daily. 09/27/22   Billie Lade, MD  metFORMIN (GLUCOPHAGE-XR) 500 MG 24 hr tablet Take 2 tablets (1,000 mg total) by mouth 2 (two) times daily with a meal. 09/27/22 03/26/23  Billie Lade, MD  naproxen (NAPROSYN) 500 MG tablet Take 1 tablet (500 mg total) by mouth 2 (two) times daily with a meal. 02/27/22   Darreld Mclean, MD  sildenafil (VIAGRA) 50 MG tablet Take 1 tablet (50 mg total) by mouth daily as needed for erectile dysfunction. 09/27/22   Billie Lade, MD  ZOLOFT 50 MG tablet Take 1 tablet (50 mg total) by mouth daily. Take by mouth. 04/10/22   Billie Lade, MD      Allergies    Patient has no known allergies.    Review of Systems   Review of Systems  All other systems reviewed and are negative.   Physical Exam Updated Vital Signs BP (!) 151/99 (BP Location: Right  Arm)   Pulse 97   Temp 98.7 F (37.1 C) (Temporal)   Resp 16   Ht 1.727 m (5\' 8" )   Wt 79.8 kg   SpO2 98%   BMI 26.76 kg/m  Physical Exam Vitals and nursing note reviewed.  Constitutional:      General: He is not in acute distress.    Appearance: He is well-developed.  HENT:     Head: Normocephalic and atraumatic.     Mouth/Throat:     Pharynx: No oropharyngeal exudate.  Eyes:     General: No scleral icterus.       Right eye: No discharge.        Left eye: No discharge.     Conjunctiva/sclera: Conjunctivae normal.     Pupils: Pupils are equal, round, and reactive to light.  Neck:     Thyroid: No thyromegaly.     Vascular: No JVD.  Cardiovascular:     Rate  and Rhythm: Normal rate and regular rhythm.     Heart sounds: Normal heart sounds. No murmur heard.    No friction rub. No gallop.  Pulmonary:     Effort: Pulmonary effort is normal. No respiratory distress.     Breath sounds: Normal breath sounds. No wheezing or rales.  Abdominal:     General: Bowel sounds are normal. There is no distension.     Palpations: Abdomen is soft. There is no mass.     Tenderness: There is no abdominal tenderness.  Musculoskeletal:        General: No tenderness. Normal range of motion.     Cervical back: Normal range of motion and neck supple.  Lymphadenopathy:     Cervical: No cervical adenopathy.  Skin:    General: Skin is warm and dry.     Findings: No erythema or rash. Lesion: .milm. Neurological:     Mental Status: He is alert.     Coordination: Coordination normal.  Psychiatric:     Comments: Depressed affect, no active internal stimuli     ED Results / Procedures / Treatments   Labs (all labs ordered are listed, but only abnormal results are displayed) Labs Reviewed  COMPREHENSIVE METABOLIC PANEL - Abnormal; Notable for the following components:      Result Value   Sodium 133 (*)    Chloride 95 (*)    Glucose, Bld 428 (*)    Creatinine, Ser 1.65 (*)    GFR, Estimated 48 (*)    All other components within normal limits  RAPID URINE DRUG SCREEN, HOSP PERFORMED - Abnormal; Notable for the following components:   Cocaine POSITIVE (*)    All other components within normal limits  CBG MONITORING, ED - Abnormal; Notable for the following components:   Glucose-Capillary 267 (*)    All other components within normal limits  ETHANOL  CBC WITH DIFFERENTIAL/PLATELET  TROPONIN I (HIGH SENSITIVITY)    EKG EKG Interpretation Date/Time:  Sunday April 14 2023 17:13:37 EST Ventricular Rate:  91 PR Interval:  150 QRS Duration:  76 QT Interval:  372 QTC Calculation: 457 R Axis:   46  Text Interpretation: Normal sinus rhythm ST & Marked T  wave abnormality, consider inferior ischemia Abnormal ECG When compared with ECG of 01-Dec-2021 20:30, T wave inversion more evident in Inferior leads Inverted T waves have replaced nonspecific T wave abnormality in Anterolateral leads Confirmed by Eber Hong ([id number]) on 04/14/2023 5:20:10 PM  Radiology No results found.  Procedures Procedures    Medications  Ordered in ED Medications  amLODipine (NORVASC) tablet 10 mg (10 mg Oral Given 04/14/23 1721)  atorvastatin (LIPITOR) tablet 80 mg (80 mg Oral Given 04/14/23 1721)  hydrochlorothiazide (HYDRODIURIL) tablet 25 mg (25 mg Oral Given 04/14/23 1722)  lisinopril (ZESTRIL) tablet 20 mg (20 mg Oral Given 04/14/23 1721)  metFORMIN (GLUCOPHAGE-XR) 24 hr tablet 1,000 mg (1,000 mg Oral Given 04/14/23 2232)  sertraline (ZOLOFT) tablet 50 mg (50 mg Oral Given 04/14/23 1722)  insulin aspart (novoLOG) injection 0-15 Units (has no administration in time range)  insulin aspart (novoLOG) injection 10 Units (10 Units Subcutaneous Given 04/14/23 1831)    ED Course/ Medical Decision Making/ A&P                                 Medical Decision Making Amount and/or Complexity of Data Reviewed Labs: ordered.  Risk OTC drugs. Prescription drug management.    This patient presents to the ED for concern of increasing depression and suicidal thoughts, this involves an extensive number of treatment options, and is a complaint that carries with it a high risk of complications and morbidity.  The differential diagnosis includes complicated by life circumstances, unemployment, homelessness, history of depression, currently not medicated for that   Co morbidities that complicate the patient evaluation  Hypertension, diabetes, depression   Additional history obtained:  Additional history obtained from medical record External records from outside source obtained and reviewed including prior psychiatric evaluations and admissions  At the time of change of shift  psychiatry has not yet seen the patient, laboratory workup has been unremarkable, negative troponin metabolic panel and CBC, there is chronic renal insufficiency which is mild, scant if any hyponatremia not in need of acute treatment, CBC without leukocytosis or anemia, drug screen positive for cocaine, the patient endorses this as well  Dr. Preston Fleeting has taken over care and will follow up recommendations for patient disposition by psychiatry.        Final Clinical Impression(s) / ED Diagnoses Final diagnoses:  Severe episode of recurrent major depressive disorder, without psychotic features (HCC)  Suicidal thoughts    Rx / DC Orders ED Discharge Orders     None         Eber Hong, MD 04/14/23 2256

## 2023-04-14 NOTE — Progress Notes (Incomplete)
Troy Gordon  Patient Name: Troy Gordon MRN: [id number] DOB: Feb 15, 1966 DATE OF Consult: 04/14/2023  PRIMARY PSYCHIATRIC DIAGNOSES  1.  Major Depression, Recurrent, Severe without Psychotic Features 2.  Cocaine Use Disorder, Severe   RECOMMENDATIONS  Recommendations: Medication recommendations: Recommend that patient get back on Zoloft, 25 mg every day for depression/anxiety, as did well on it before.  Patient to call [facility] tomorrow to set up follow-up and getting back into treatment. Non-Medication/therapeutic recommendations: Patient does need to get back into outpatient treatment, and he is willing to go back to the dual diagnosis program at [facility].  Although has had some suicidal thoughts recently, they have been associated with his cocaine usage (as have his occasional auditory hallucinations).  He relapsed because he had lost a job, but he now has a promising interview on Thursday for a similar job.  He is quite hopeful about it, and is future-oriented.  Also, he will be staying with his girlfriend, who is supportive of his sobriety.  Is inpatient psychiatric hospitalization recommended for this patient? No (Explain why): As above, patient is now feeling much more hopeful for his future, and he does wish to get back on his antidepressant medication.  As he is having no suicidal/homicidal ideation or current psychotic sx's (off cocaine), he no longer meets criteria for inpatient admission, and patient is from a psychiatric standpoint cleared for discharge to outpatient treatment Is another care setting recommended for this patient? (examples may include Crisis Stabilization Unit, Residential/Recovery Treatment, ALF/SNF, Memory Care Unit)  No (Explain why): See above From a psychiatric perspective, is this patient appropriate for discharge to an outpatient setting/resource or other less restrictive environment for continued care?  Yes (Explain why): As above, he will be  contacting [facility] for both medication and therapy follow-up Follow-Up Telepsychiatry C/L services: We will sign off for now. Please re-consult our service if needed for any concerning changes in the patient's condition, discharge planning, or questions. Communication: Treatment team members (and family members if applicable) who were involved in treatment/care discussions and planning, and with whom we spoke or engaged with via secure text/chat, include the following: Secure message sent to Dr. Preston Fleeting, ED attending, and staff, outlining above recommendations  Thank you for involving [place] in the care of this patient. If you have any additional questions or concerns, please call [phone] and ask for the provider on-call.  TELEPSYCHIATRY ATTESTATION & CONSENT  As the provider for this telehealth consult, I attest that I verified the patient's identity using two separate identifiers, introduced myself to the patient, provided my credentials, disclosed my location, and performed this encounter via a HIPAA-compliant, real-time, face-to-face, two-way, interactive audio and video platform and with the full consent and agreement of the patient (or guardian as applicable.)   Patient physical location: [facility]. Telehealth provider physical location: home office in state of [place].  Video start time: 2330h EST  Video end time: 2345h EST   Total time spent in this encounter was 30 minutes, including record review, clinical interview, behavior observations, discussion of impressions and recommendations (including medications and hospitalization), and consultation/communication with relevant parties   IDENTIFYING DATA  Troy Gordon is a 58 y.o. year-old male for whom a psychiatric consultation has been ordered by the primary provider. The patient was identified using two separate identifiers.  CHIEF COMPLAINT/REASON FOR CONSULT   I got back on cocaine after I lost my job, and I was getting very  depressed. But I think I've got  a new job lined up, and I'm much more hopeful, and I don't want to die.   HISTORY OF PRESENT ILLNESS (HPI)  The patient reports a longstanding history .  PAST PSYCHIATRIC HISTORY  *** Otherwise as per HPI above.  PAST MEDICAL HISTORY  Past Medical History:  Diagnosis Date  . Depression   . Diabetes mellitus (HCC)   . Dyslipidemia   . Hyperlipidemia   . Hypertension    ***  HOME MEDICATIONS  Facility Ordered Medications  Medication  . amLODipine (NORVASC) tablet 10 mg  . atorvastatin (LIPITOR) tablet 80 mg  . hydrochlorothiazide (HYDRODIURIL) tablet 25 mg  . lisinopril (ZESTRIL) tablet 20 mg  . metFORMIN (GLUCOPHAGE-XR) 24 hr tablet 1,000 mg  . sertraline (ZOLOFT) tablet 50 mg  . [COMPLETED] insulin aspart (novoLOG) injection 10 Units  . [START ON 04/15/2023] insulin aspart (novoLOG) injection 0-15 Units   PTA Medications  Medication Sig  . acetaminophen (TYLENOL) 500 MG tablet Take 500 mg by mouth every 6 (six) hours as needed.  . naproxen (NAPROSYN) 500 MG tablet Take 1 tablet (500 mg total) by mouth 2 (two) times daily with a meal.  . ZOLOFT 50 MG tablet Take 1 tablet (50 mg total) by mouth daily. Take by mouth.  Marland Kitchen amLODipine (NORVASC) 10 MG tablet Take 1 tablet (10 mg total) by mouth daily.  Marland Kitchen atorvastatin (LIPITOR) 80 MG tablet Take 1 tablet (80 mg total) by mouth daily.  . hydrochlorothiazide (HYDRODIURIL) 25 MG tablet Take 1 tablet (25 mg total) by mouth daily.  . insulin glargine (LANTUS SOLOSTAR) 100 UNIT/ML Solostar Pen Inject 50 Units into the skin at bedtime.  . sildenafil (VIAGRA) 50 MG tablet Take 1 tablet (50 mg total) by mouth daily as needed for erectile dysfunction.  . metFORMIN (GLUCOPHAGE-XR) 500 MG 24 hr tablet Take 2 tablets (1,000 mg total) by mouth 2 (two) times daily with a meal.  . lisinopril (ZESTRIL) 20 MG tablet Take 1 tablet (20 mg total) by mouth daily.   ***  ALLERGIES  No Known Allergies  SOCIAL & SUBSTANCE USE  HISTORY  Social History   Socioeconomic History  . Marital status: Single    Spouse name: Not on file  . Number of children: Not on file  . Years of education: Not on file  . Highest education level: Not on file  Occupational History  . Not on file  Tobacco Use  . Smoking status: Former    Types: Cigarettes  . Smokeless tobacco: Never  . Tobacco comments:    1 cig every 5 weeks  Vaping Use  . Vaping status: Never Used  Substance and Sexual Activity  . Alcohol use: Not Currently    Comment: occasionally beer  . Drug use: Yes    Types: Marijuana, Cocaine  . Sexual activity: Not on file  Other Topics Concern  . Not on file  Social History Narrative  . Not on file   Social Drivers of Health   Financial Resource Strain: Not on file  Food Insecurity: Not on file  Transportation Needs: Not on file  Physical Activity: Not on file  Stress: Not on file  Social Connections: Not on file   Social History   Tobacco Use  Smoking Status Former  . Types: Cigarettes  Smokeless Tobacco Never  Tobacco Comments   1 cig every 5 weeks   Social History   Substance and Sexual Activity  Alcohol Use Not Currently   Comment: occasionally beer   Social History  Substance and Sexual Activity  Drug Use Yes  . Types: Marijuana, Cocaine    Additional pertinent information ***.  FAMILY HISTORY  Family History  Problem Relation Age of Onset  . Diabetes Maternal Grandmother   . Hypertension Maternal Grandmother   . Hypertension Father    Family Psychiatric History (if known):  ***  MENTAL STATUS EXAM (MSE)  Mental Status Exam: General Appearance: {Appearance:22683}  Orientation:  {BHH ORIENTATION (PAA):22689}  Memory:  {BHH MEMORY:22881}  Concentration:  {Concentration:21399}  Recall:  {BHH GOOD/FAIR/POOR:22877}  Attention  {BH Attention Span:31825}  Eye Contact:  {BHH EYE CONTACT:22684}  Speech:  {Speech:22685}  Language:  {BHH GOOD/FAIR/POOR:22877}  Volume:  {Volume  (PAA):22686}  Mood: ***  Affect:  {Affect (PAA):22687}  Thought Process:  {Thought Process (PAA):22688}  Thought Content:  {Thought Content:22690}  Suicidal Thoughts:  {ST/HT (PAA):22692}  Homicidal Thoughts:  {ST/HT (PAA):22692}  Judgement:  {Judgement (PAA):22694}  Insight:  {Insight (PAA):22695}  Psychomotor Activity:  {Psychomotor (PAA):22696}  Akathisia:  {BHH YES OR NO:22294}  Fund of Knowledge:  {BHH GOOD/FAIR/POOR:22877}    Assets:  {Assets (PAA):22698}  Cognition:  {chl bhh cognition:304700322}  ADL's:  {BHH QMV'H:84696}  AIMS (if indicated):       VITALS  Blood pressure (!) 151/99, pulse 97, temperature 98.7 F (37.1 C), temperature source Temporal, resp. rate 16, height 5\' 8"  (1.727 m), weight 79.8 kg, SpO2 98%.  LABS  Admission on 04/14/2023  Component Date Value Ref Range Status  . Sodium 04/14/2023 133 (L)  135 - 145 mmol/L Final  . Potassium 04/14/2023 4.6  3.5 - 5.1 mmol/L Final  . Chloride 04/14/2023 95 (L)  98 - 111 mmol/L Final  . CO2 04/14/2023 28  22 - 32 mmol/L Final  . Glucose, Bld 04/14/2023 428 (H)  70 - 99 mg/dL Final   Glucose reference range applies only to samples taken after fasting for at least 8 hours.  . BUN 04/14/2023 20  6 - 20 mg/dL Final  . Creatinine, Ser 04/14/2023 1.65 (H)  0.61 - 1.24 mg/dL Final  . Calcium 29/52/8413 9.4  8.9 - 10.3 mg/dL Final  . Total Protein 04/14/2023 7.0  6.5 - 8.1 g/dL Final  . Albumin 24/40/1027 3.8  3.5 - 5.0 g/dL Final  . AST 25/36/6440 18  15 - 41 U/L Final  . ALT 04/14/2023 18  0 - 44 U/L Final  . Alkaline Phosphatase 04/14/2023 60  38 - 126 U/L Final  . Total Bilirubin 04/14/2023 0.8  0.0 - 1.2 mg/dL Final  . GFR, Estimated 04/14/2023 48 (L)  >60 mL/min Final   Comment: (Gordon) Calculated using the CKD-EPI Creatinine Equation (2021)   . Anion gap 04/14/2023 10  5 - 15 Final   Performed at [facility], [address] [place], [place] [address]  . Alcohol, Ethyl (B) 04/14/2023 <10  <10 mg/dL Final    Comment: (Gordon) Lowest detectable limit for serum alcohol is 10 mg/dL.  For medical purposes only. Performed at [facility], [address], [place], [place] [address]   . Opiates 04/14/2023 NONE DETECTED  NONE DETECTED Final  . Cocaine 04/14/2023 POSITIVE (A)  NONE DETECTED Final  . Benzodiazepines 04/14/2023 NONE DETECTED  NONE DETECTED Final  . Amphetamines 04/14/2023 NONE DETECTED  NONE DETECTED Final  . Tetrahydrocannabinol 04/14/2023 NONE DETECTED  NONE DETECTED Final  . Barbiturates 04/14/2023 NONE DETECTED  NONE DETECTED Final   Comment: (Gordon) DRUG SCREEN FOR MEDICAL PURPOSES ONLY.  IF CONFIRMATION IS NEEDED FOR ANY PURPOSE, NOTIFY LAB  WITHIN 5 DAYS.  LOWEST DETECTABLE LIMITS FOR URINE DRUG SCREEN Drug Class                     Cutoff (ng/mL) Amphetamine and metabolites    1000 Barbiturate and metabolites    200 Benzodiazepine                 200 Opiates and metabolites        300 Cocaine and metabolites        300 THC                            50 Performed at [facility], [address] [place], [place] [address]   . WBC 04/14/2023 7.3  4.0 - 10.5 K/uL Final  . RBC 04/14/2023 5.49  4.22 - 5.81 MIL/uL Final  . Hemoglobin 04/14/2023 15.2  13.0 - 17.0 g/dL Final  . HCT 60/45/4098 44.8  39.0 - 52.0 % Final  . MCV 04/14/2023 81.6  80.0 - 100.0 fL Final  . MCH 04/14/2023 27.7  26.0 - 34.0 pg Final  . MCHC 04/14/2023 33.9  30.0 - 36.0 g/dL Final  . RDW 11/91/4782 13.0  11.5 - 15.5 % Final  . Platelets 04/14/2023 192  150 - 400 K/uL Final  . nRBC 04/14/2023 0.0  0.0 - 0.2 % Final  . Neutrophils Relative % 04/14/2023 58  % Final  . Neutro Abs 04/14/2023 4.3  1.7 - 7.7 K/uL Final  . Lymphocytes Relative 04/14/2023 33  % Final  . Lymphs Abs 04/14/2023 2.4  0.7 - 4.0 K/uL Final  . Monocytes Relative 04/14/2023 7  % Final  . Monocytes Absolute 04/14/2023 0.5  0.1 - 1.0 K/uL Final  . Eosinophils Relative 04/14/2023 1  % Final  . Eosinophils Absolute 04/14/2023 0.1  0.0 -  0.5 K/uL Final  . Basophils Relative 04/14/2023 1  % Final  . Basophils Absolute 04/14/2023 0.0  0.0 - 0.1 K/uL Final  . Immature Granulocytes 04/14/2023 0  % Final  . Abs Immature Granulocytes 04/14/2023 0.02  0.00 - 0.07 K/uL Final   Performed at [facility], [address] [place], [place] [address]  . Troponin I (High Sensitivity) 04/14/2023 13  <18 ng/L Final   Comment: (Gordon) Elevated high sensitivity troponin I (hsTnI) values and significant  changes across serial measurements may suggest ACS but many other  chronic and acute conditions are known to elevate hsTnI results.  Refer to the "Links" section for chest pain algorithms and additional  guidance. Performed at [facility], [address], [place], [place] [address]   . Glucose-Capillary 04/14/2023 267 (H)  70 - 99 mg/dL Final   Glucose reference range applies only to samples taken after fasting for at least 8 hours.    PSYCHIATRIC REVIEW OF SYSTEMS (ROS)  ROS: Notable for the following relevant positive findings: ROS  Additional findings:      Musculoskeletal: {Musculoskeletal neeeds/assessment:304550014}      Gait & Station: {Gait and Station:304550016}      Pain Screening: {Pain Description:304550015}      Nutrition & Dental Concerns: {Nutrition & Dental Concerns:304550017}  RISK FORMULATION/ASSESSMENT  Is the patient experiencing any suicidal or homicidal ideations: {yes/no:20286}       Explain if yes: *** Protective factors considered for safety management: ***  Risk factors/concerns considered for safety management: *** {CHL BH Risk Factors Safety Management:304550011}  Is there a safety management plan with the  patient and treatment team to minimize risk factors and promote protective factors: {yes/no:20286}           Explain: *** Is crisis care placement or psychiatric hospitalization recommended: {yes/no:20286}     Based on my current evaluation and risk assessment, patient is determined at this time to be  at:  {Risk level:304550009}  *RISK ASSESSMENT Risk assessment is a dynamic process; it is possible that this patient's condition, and risk level, may change. This should be re-evaluated and managed over time as appropriate. Please re-consult psychiatric consult services if additional assistance is needed in terms of risk assessment and management. If your team decides to discharge this patient, please advise the patient how to best access emergency psychiatric services, or to call 911, if their condition worsens or they feel unsafe in any way.   Ezekiel Slocumb, MD Telepsychiatry Consult ServicesPatient ID: KAIDAN HARPSTER, male   DOB: Jan 09, 1966, 58 y.o.   MRN: [id number]

## 2023-04-14 NOTE — Progress Notes (Signed)
Troy Gordon  Patient Name: Troy Gordon MRN: [id number] DOB: 1965-05-27 DATE OF Consult: 04/14/2023  PRIMARY PSYCHIATRIC DIAGNOSES  1.  Major Depression, Recurrent, Severe without Psychotic Features 2.  Cocaine Use Disorder, Severe   RECOMMENDATIONS  Recommendations: Medication recommendations: Recommend that patient get back on Zoloft, 25 mg every day for depression/anxiety, as did well on it before.  Patient to call [facility] tomorrow to set up follow-up and getting back into treatment. Non-Medication/therapeutic recommendations: Patient does need to get back into outpatient treatment, and he is willing to go back to Troy dual diagnosis program at [facility].  Although has had some suicidal thoughts recently, they have been associated with his cocaine usage (as have his occasional auditory hallucinations).  He relapsed because he had lost a job, but he now has a promising interview on Thursday for a similar job.  He is quite hopeful about it, and is future-oriented.  Also, he will be staying with his girlfriend, who is supportive of his sobriety.  Is inpatient psychiatric hospitalization recommended for this patient? No (Explain why): As above, patient is now feeling much more hopeful for his future, and he does wish to get back on his antidepressant medication.  As he is having no suicidal/homicidal ideation or current psychotic sx's (off cocaine), he no longer meets criteria for inpatient admission, and patient is from a psychiatric standpoint cleared for discharge to outpatient treatment Is another care setting recommended for this patient? (examples may include Crisis Stabilization Unit, Residential/Recovery Treatment, ALF/SNF, Memory Care Unit)  No (Explain why): See above From a psychiatric perspective, is this patient appropriate for discharge to an outpatient setting/resource or other less restrictive environment for continued care?  Yes (Explain why): As above, he will be  contacting [facility] for both medication and therapy follow-up Follow-Up [facility] C/L services: We will sign off for now. Please re-consult our service if needed for any concerning changes in Troy patient's condition, discharge planning, or questions. Communication: Treatment team members (and family members if applicable) who were involved in treatment/care discussions and planning, and with whom we spoke or engaged with via secure text/chat, include Troy following: Secure message sent to Troy [facility], Gordon attending, and staff, outlining above recommendations  Thank you for involving [place] in Troy care of this patient. If you have any additional questions or concerns, please call [phone] and ask for Troy provider on-call.  Troy [facility] ATTESTATION & CONSENT  As Troy provider for this telehealth consult, I attest that I verified Troy patient's identity using two separate identifiers, introduced myself to Troy patient, provided my credentials, disclosed my location, and performed this encounter via a [facility], real-time, face-to-face, two-way, interactive audio and video platform and with Troy full consent and agreement of Troy patient (or guardian as applicable.)   Patient physical location: [facility]. Telehealth provider physical location: home office in state of [place].  Video start time: 2330h EST  Video end time: 2345h EST   Total time spent in this encounter was 30 minutes, including record review, clinical interview, behavior observations, discussion of impressions and recommendations (including medications and hospitalization), and consultation/communication with relevant parties   IDENTIFYING DATA  Troy Gordon is a 58 y.o. year-old male for whom a psychiatric consultation has been ordered by Troy primary provider. Troy patient was identified using two separate identifiers.  CHIEF COMPLAINT/REASON FOR CONSULT   I got back on cocaine after I lost my job, and I was getting very  depressed. But I think I've got  a new job lined up, and I'm much more hopeful, and I don't want to die.   HISTORY OF PRESENT ILLNESS (HPI)  Troy patient reports a longstanding history of recurrent major depression, which has long been complicated by cocaine use disorder (and some cannabis use).  Patient had been doing better, but had to quit his job at a [facility] about five weeks ago because of increased stress on Troy job.  As a result, he began abusing cocaine again to try to make himself feel better, which only exacerbated his depression.  Was staying at his home, which is in an area in which it is relatively easy to get access to cocaine.  However, two days ago (Troy last day he had a hit of cocaine), patient received a call from another company that wishes to interview him for a job that will utilize Troy skills that he has, plus develop some others.  This has given patient quite a bit of hope, and he does not wish to die or harm anyone else.  He is going to move in with his girlfriend, who lives in a different area and is very supportive of his sobriety.  He is not having a significant worsening of depression coming off Troy last cocaine binge.  Patient did have some treatment at [facility], and that is where he was prescribed Zoloft.  Patient wishes to go back there to establish treatment again.  He took Zoloft 50 mg every day at Troy time, but he did have some sexual side effects, and he would like to see if he could do OK vis-a-vis his affect on 25 mg every day.  Patient is without suicidal/homicidal ideation, and now that he is off cocaine, he is having no psychotic sx's.  BAL negative.  UDS was positive only for cocaine.  PAST PSYCHIATRIC HISTORY   Patient was at [facility] for dual-diagnosis treatment.  Otherwise as per HPI above.  PAST MEDICAL HISTORY  Past Medical History:  Diagnosis Date   Depression    Diabetes mellitus (HCC)    Dyslipidemia    Hyperlipidemia    Hypertension      HOME  MEDICATIONS  Facility Ordered Medications  Medication   amLODipine (NORVASC) tablet 10 mg   atorvastatin (LIPITOR) tablet 80 mg   hydrochlorothiazide (HYDRODIURIL) tablet 25 mg   lisinopril (ZESTRIL) tablet 20 mg   metFORMIN (GLUCOPHAGE-XR) 24 hr tablet 1,000 mg   sertraline (ZOLOFT) tablet 50 mg   [COMPLETED] insulin aspart (novoLOG) injection 10 Units   [START ON 04/15/2023] insulin aspart (novoLOG) injection 0-15 Units   PTA Medications  Medication Sig   acetaminophen (TYLENOL) 500 MG tablet Take 500 mg by mouth every 6 (six) hours as needed.   naproxen (NAPROSYN) 500 MG tablet Take 1 tablet (500 mg total) by mouth 2 (two) times daily with a meal.   ZOLOFT 50 MG tablet Take 1 tablet (50 mg total) by mouth daily. Take by mouth.   amLODipine (NORVASC) 10 MG tablet Take 1 tablet (10 mg total) by mouth daily.   atorvastatin (LIPITOR) 80 MG tablet Take 1 tablet (80 mg total) by mouth daily.   hydrochlorothiazide (HYDRODIURIL) 25 MG tablet Take 1 tablet (25 mg total) by mouth daily.   insulin glargine (LANTUS SOLOSTAR) 100 UNIT/ML Solostar Pen Inject 50 Units into Troy skin at bedtime.   sildenafil (VIAGRA) 50 MG tablet Take 1 tablet (50 mg total) by mouth daily as needed for erectile dysfunction.   metFORMIN (GLUCOPHAGE-XR) 500 MG 24  hr tablet Take 2 tablets (1,000 mg total) by mouth 2 (two) times daily with a meal.   lisinopril (ZESTRIL) 20 MG tablet Take 1 tablet (20 mg total) by mouth daily.     ALLERGIES  No Known Allergies  SOCIAL & SUBSTANCE USE HISTORY  Social History   Socioeconomic History   Marital status: Single    Spouse name: Not on file   Number of children: Not on file   Years of education: Not on file   Highest education level: Not on file  Occupational History   Not on file  Tobacco Use   Smoking status: Former    Types: Cigarettes   Smokeless tobacco: Never   Tobacco comments:    1 cig every 5 weeks  Vaping Use   Vaping status: Never Used  Substance and  Sexual Activity   Alcohol use: Not Currently    Comment: occasionally beer   Drug use: Yes    Types: Marijuana, Cocaine   Sexual activity: Not on file  Other Topics Concern   Not on file  Social History Narrative   Not on file   Social Drivers of Health   Financial Resource Strain: Not on file  Food Insecurity: Not on file  Transportation Needs: Not on file  Physical Activity: Not on file  Stress: Not on file  Social Connections: Not on file   Social History   Tobacco Use  Smoking Status Former   Types: Cigarettes  Smokeless Tobacco Never  Tobacco Comments   1 cig every 5 weeks   Social History   Substance and Sexual Activity  Alcohol Use Not Currently   Comment: occasionally beer   Social History   Substance and Sexual Activity  Drug Use Yes   Types: Marijuana, Cocaine    .  FAMILY HISTORY  Family History  Problem Relation Age of Onset   Diabetes Maternal Grandmother    Hypertension Maternal Grandmother    Hypertension Father    Family Psychiatric History (if known):  Did not report  MENTAL STATUS EXAM (MSE)  Mental Status Exam: General Appearance: Fairly Groomed  Orientation:  Full (Time, Place, and Person)  Memory:  Immediate;   Good Recent;   Good Remote;   Good  Concentration:  Concentration: Good and Attention Span: Good  Recall:  Good  Attention  Good  Eye Contact:  Good  Speech:  Clear and Coherent and Normal Rate  Language:  Good  Volume:  Normal  Mood: I'm feeling better now  Affect:  Appropriate, Congruent, and Constricted  Thought Process:  Coherent  Thought Content:  Logical and Abstract Reasoning  Suicidal Thoughts:  No  Homicidal Thoughts:  No  Judgement:  Fair  Insight:  Fair  Psychomotor Activity:  Normal  Akathisia:  NA  Fund of Knowledge:  Good    Assets:  Communication Skills Desire for Improvement Housing Social Support Vocational/Educational  Cognition:  WNL  ADL's:  Intact  AIMS (if indicated):       VITALS   Blood pressure (!) 151/99, pulse 97, temperature 98.7 F (37.1 C), temperature source Temporal, resp. rate 16, height 5\' 8"  (1.727 m), weight 79.8 kg, SpO2 98%.  LABS  Admission on 04/14/2023  Component Date Value Ref Range Status   Sodium 04/14/2023 133 (L)  135 - 145 mmol/L Final   Potassium 04/14/2023 4.6  3.5 - 5.1 mmol/L Final   Chloride 04/14/2023 95 (L)  98 - 111 mmol/L Final   CO2 04/14/2023 28  22 -  32 mmol/L Final   Glucose, Bld 04/14/2023 428 (H)  70 - 99 mg/dL Final   Glucose reference range applies only to samples taken after fasting for at least 8 hours.   BUN 04/14/2023 20  6 - 20 mg/dL Final   Creatinine, Ser 04/14/2023 1.65 (H)  0.61 - 1.24 mg/dL Final   Calcium 09/81/1914 9.4  8.9 - 10.3 mg/dL Final   Total Protein 78/29/5621 7.0  6.5 - 8.1 g/dL Final   Albumin 30/86/5784 3.8  3.5 - 5.0 g/dL Final   AST 69/62/9528 18  15 - 41 U/L Final   ALT 04/14/2023 18  0 - 44 U/L Final   Alkaline Phosphatase 04/14/2023 60  38 - 126 U/L Final   Total Bilirubin 04/14/2023 0.8  0.0 - 1.2 mg/dL Final   GFR, Estimated 04/14/2023 48 (L)  >60 mL/min Final   Comment: (Gordon) Calculated using Troy CKD-EPI Creatinine Equation (2021)    Anion gap 04/14/2023 10  5 - 15 Final   Performed at [facility], [address], [place], [place] [address]   Alcohol, Ethyl (B) 04/14/2023 <10  <10 mg/dL Final   Comment: (Gordon) Lowest detectable limit for serum alcohol is 10 mg/dL.  For medical purposes only. Performed at [facility], [address], [place], [place] [address]    Opiates 04/14/2023 NONE DETECTED  NONE DETECTED Final   Cocaine 04/14/2023 POSITIVE (A)  NONE DETECTED Final   Benzodiazepines 04/14/2023 NONE DETECTED  NONE DETECTED Final   Amphetamines 04/14/2023 NONE DETECTED  NONE DETECTED Final   Tetrahydrocannabinol 04/14/2023 NONE DETECTED  NONE DETECTED Final   Barbiturates 04/14/2023 NONE DETECTED  NONE DETECTED Final   Comment: (Gordon) DRUG SCREEN FOR MEDICAL PURPOSES ONLY.  IF  CONFIRMATION IS NEEDED FOR ANY PURPOSE, NOTIFY LAB WITHIN 5 DAYS.  LOWEST DETECTABLE LIMITS FOR URINE DRUG SCREEN Drug Class                     Cutoff (ng/mL) Amphetamine and metabolites    1000 Barbiturate and metabolites    200 Benzodiazepine                 200 Opiates and metabolites        300 Cocaine and metabolites        300 THC                            50 Performed at [facility], [address] [place], [place] [address]    WBC 04/14/2023 7.3  4.0 - 10.5 K/uL Final   RBC 04/14/2023 5.49  4.22 - 5.81 MIL/uL Final   Hemoglobin 04/14/2023 15.2  13.0 - 17.0 g/dL Final   HCT 64/40/3474 44.8  39.0 - 52.0 % Final   MCV 04/14/2023 81.6  80.0 - 100.0 fL Final   MCH 04/14/2023 27.7  26.0 - 34.0 pg Final   MCHC 04/14/2023 33.9  30.0 - 36.0 g/dL Final   RDW 25/95/6387 13.0  11.5 - 15.5 % Final   Platelets 04/14/2023 192  150 - 400 K/uL Final   nRBC 04/14/2023 0.0  0.0 - 0.2 % Final   Neutrophils Relative % 04/14/2023 58  % Final   Neutro Abs 04/14/2023 4.3  1.7 - 7.7 K/uL Final   Lymphocytes Relative 04/14/2023 33  % Final   Lymphs Abs 04/14/2023 2.4  0.7 - 4.0 K/uL Final   Monocytes Relative 04/14/2023 7  % Final  Monocytes Absolute 04/14/2023 0.5  0.1 - 1.0 K/uL Final   Eosinophils Relative 04/14/2023 1  % Final   Eosinophils Absolute 04/14/2023 0.1  0.0 - 0.5 K/uL Final   Basophils Relative 04/14/2023 1  % Final   Basophils Absolute 04/14/2023 0.0  0.0 - 0.1 K/uL Final   Immature Granulocytes 04/14/2023 0  % Final   Abs Immature Granulocytes 04/14/2023 0.02  0.00 - 0.07 K/uL Final   Performed at [facility], [address] [place], [place] [address]   Troponin I (High Sensitivity) 04/14/2023 13  <18 ng/L Final   Comment: (Gordon) Elevated high sensitivity troponin I (hsTnI) values and significant  changes across serial measurements may suggest ACS but many other  chronic and acute conditions are known to elevate hsTnI results.  Refer to Troy "Links" section for chest  pain algorithms and additional  guidance. Performed at [facility], [address] [place], [place] [address]    Glucose-Capillary 04/14/2023 267 (H)  70 - 99 mg/dL Final   Glucose reference range applies only to samples taken after fasting for at least 8 hours.    PSYCHIATRIC REVIEW OF SYSTEMS (ROS)  ROS: Notable for Troy following relevant positive findings: Review of Systems  Constitutional: Negative.   HENT: Negative.    Eyes: Negative.   Respiratory: Negative.    Cardiovascular: Negative.   Gastrointestinal: Negative.   Genitourinary: Negative.   Musculoskeletal: Negative.   Skin: Negative.   Neurological: Negative.   Endo/Heme/Allergies: Negative.   Psychiatric/Behavioral:  Positive for depression and substance abuse.     Additional findings:      Musculoskeletal: No abnormal movements observed      Gait & Station: Normal      Pain Screening: Denies      Nutrition & Dental Concerns: Reviewed  RISK FORMULATION/ASSESSMENT  Is Troy patient experiencing any suicidal or homicidal ideations: No       Patient not off cocaine x 2 days, and with upcoming job interview and support of his girlfriend, he is having no suicidal or homicidal ideaiton  Protective factors considered for safety management:   Patient will be staying with his girlfriend to avoid exposure to drugs, and he wishes to get back into dual-diagnosis treatment  Risk factors/concerns considered for safety management:  Depression Substance abuse/dependence Recent loss Impulsivity Male gender Unmarried  Is there a safety management plan with Troy patient and treatment team to minimize risk factors and promote protective factors: Yes           Explain: Patient will be staying with his girlfriend, who is supportive of sobriety, and getting out of are where drugs are available.  Willing to get back into treatment and call tomorrow.  Is crisis care placement or psychiatric hospitalization recommended: No     Based  on my current evaluation and risk assessment, patient is determined at this time to be at:  Low risk  *RISK ASSESSMENT Risk assessment is a dynamic process; it is possible that this patient's condition, and risk level, may change. This should be re-evaluated and managed over time as appropriate. Please re-consult psychiatric consult services if additional assistance is needed in terms of risk assessment and management. If your team decides to discharge this patient, please advise Troy patient how to best access emergency psychiatric services, or to call 911, if their condition worsens or they feel unsafe in any way.   Ezekiel Slocumb, MD [facility] Consult ServicesPatient ID: Troy Gordon, male   DOB: [birth year], 58  y.o.   MRN: [id number]

## 2023-04-14 NOTE — ED Triage Notes (Signed)
Pt with SI, pt wants the hospital to inject him.  + unemployed   Admits to using cocaine 2 days ago. Denies ETOH

## 2023-04-15 ENCOUNTER — Telehealth: Payer: Self-pay

## 2023-04-15 NOTE — Telephone Encounter (Addendum)
Mr. Dimperio called today inquiring about his Medicaid He does still currently have [facility]. Was able to connect with him and he states he is having depression and anxiety issues and that he was in the hospital yesterday and needs to get back on his zoloft. He left his most recent job in December due to personnel issues. He says he has an interview for new job this week.  He admits to "sliding back into my ways" I asked if he needed to access to any NA or AA meetings and he states no he will go to [facility] as the provider told him at hospital.  We discussed [facility] there now at [facility] and that they are open 7 days a week / 24 hours now. I asked if he was going today and he states he is trying to take "care of things today" I'll go tomorrow.  He asked if we had electric heaters at [employer] and we do not. He asked if I could help him find one inexpensive. I asked if he were living still in the house he was working on and he avoided the answer.  I told him I would look into the heater and call him back. He provided his new number. He states he has an appointment with his PCP and [facility] for March and confirmed in [facility] appointment is for May 21, 2023 at 220PM.  Francee Nodal RN [facility]

## 2023-04-15 NOTE — Telephone Encounter (Signed)
Contacted [name] regarding the [employer] prices and notified him of findings.  Inquired if he had his medications such as his Diabetic medications. He states he has his metformin and no insulin. I asked if he had checked to see if he can be seen sooner at [facility] primary due to needing refills. He states he is to call each day to see if they have cancellations.  Will plan to connect with [name] again tomorrow.  He states he is currently safe and at Library and has no thoughts of harming himself or others.  Will continue to assist with advice for resources.   Francee Nodal RN [facility] [employer]

## 2023-04-15 NOTE — ED Notes (Signed)
Belongings returned to the patient. Reviewed D/C information with the patient, pt verbalized understanding. No additional concerns at this time.

## 2023-04-16 ENCOUNTER — Telehealth: Payer: Self-pay

## 2023-04-16 NOTE — Telephone Encounter (Signed)
 Called Mr. Polansky to let him know a small electric fan heater is here at the office for him, if he can pick it up. He states he will try to get a ride. Discussed hours [facility] is open after 1pm. He is to contact me if he cannot get transportation and we will possibly work on an alternative.   Avelina JONELLE Skeen RN [facility] [employer]

## 2023-05-21 ENCOUNTER — Ambulatory Visit (INDEPENDENT_AMBULATORY_CARE_PROVIDER_SITE_OTHER): Payer: MEDICAID | Admitting: Internal Medicine

## 2023-05-21 ENCOUNTER — Encounter: Payer: Self-pay | Admitting: Internal Medicine

## 2023-05-21 VITALS — BP 183/104 | HR 91 | Ht 68.0 in | Wt 176.2 lb

## 2023-05-21 DIAGNOSIS — E1169 Type 2 diabetes mellitus with other specified complication: Secondary | ICD-10-CM

## 2023-05-21 DIAGNOSIS — E785 Hyperlipidemia, unspecified: Secondary | ICD-10-CM

## 2023-05-21 DIAGNOSIS — I10 Essential (primary) hypertension: Secondary | ICD-10-CM | POA: Diagnosis not present

## 2023-05-21 DIAGNOSIS — Z7984 Long term (current) use of oral hypoglycemic drugs: Secondary | ICD-10-CM

## 2023-05-21 DIAGNOSIS — R3589 Other polyuria: Secondary | ICD-10-CM | POA: Diagnosis not present

## 2023-05-21 DIAGNOSIS — E1165 Type 2 diabetes mellitus with hyperglycemia: Secondary | ICD-10-CM

## 2023-05-21 DIAGNOSIS — R197 Diarrhea, unspecified: Secondary | ICD-10-CM

## 2023-05-21 MED ORDER — HYDROCHLOROTHIAZIDE 25 MG PO TABS: 25.0000 mg | ORAL_TABLET | Freq: Every day | ORAL | 0 refills | Status: DC

## 2023-05-21 MED ORDER — AMLODIPINE BESYLATE 10 MG PO TABS: 10.0000 mg | ORAL_TABLET | Freq: Every day | ORAL | 0 refills | Status: DC

## 2023-05-21 MED ORDER — LANTUS SOLOSTAR 100 UNIT/ML ~~LOC~~ SOPN: 50.0000 [IU] | PEN_INJECTOR | Freq: Every day | SUBCUTANEOUS | 99 refills | Status: DC

## 2023-05-21 MED ORDER — METFORMIN HCL ER 500 MG PO TB24: 1000.0000 mg | ORAL_TABLET | Freq: Two times a day (BID) | ORAL | 1 refills | Status: DC

## 2023-05-21 MED ORDER — ATORVASTATIN CALCIUM 80 MG PO TABS: 80.0000 mg | ORAL_TABLET | Freq: Every day | ORAL | 0 refills | Status: DC

## 2023-05-21 MED ORDER — LISINOPRIL 20 MG PO TABS: 20.0000 mg | ORAL_TABLET | Freq: Every day | ORAL | 1 refills | Status: DC

## 2023-05-21 NOTE — Progress Notes (Signed)
 Acute Office Visit  Subjective:     Patient ID: Troy Gordon, male    DOB: 08/02/1965, 58 y.o.   MRN: [id number]  Chief Complaint  Patient presents with   Abdominal Pain    Abdominal Pain   Urinary Frequency    Frequent urination especially at night   Troy Gordon presents today for an acute visit with multiple concerns.  He endorses dull abdominal pain and diarrhea for multiple weeks.  Denies nausea/vomiting, fever/chills, and melena/hematochezia.  He has not had notified any exacerbating or alleviating factors.  He is out of all antihypertensive medications.  His blood pressure is significantly elevated.  He also endorses polyuria, mostly at night and would like to have this further investigated.  Review of Systems  Gastrointestinal:  Positive for abdominal pain and diarrhea. Negative for blood in stool, constipation, melena, nausea and vomiting.  Genitourinary:  Positive for frequency.  All other systems reviewed and are negative.     Objective:    BP (!) 183/104 (BP Location: Left Arm, Patient Position: Sitting, Cuff Size: Normal)   Pulse 91   Ht 5\' 8"  (1.727 m)   Wt 176 lb 3.2 oz (79.9 kg)   SpO2 91%   BMI 26.79 kg/m   Physical Exam Vitals reviewed.  Constitutional:      General: He is not in acute distress.    Appearance: Normal appearance. He is not ill-appearing.  HENT:     Head: Normocephalic and atraumatic.     Right Ear: External ear normal.     Left Ear: External ear normal.     Nose: Nose normal. No congestion or rhinorrhea.     Mouth/Throat:     Mouth: Mucous membranes are moist.     Pharynx: Oropharynx is clear.  Eyes:     General: No scleral icterus.    Extraocular Movements: Extraocular movements intact.     Conjunctiva/sclera: Conjunctivae normal.     Pupils: Pupils are equal, round, and reactive to light.  Cardiovascular:     Rate and Rhythm: Normal rate and regular rhythm.     Pulses: Normal pulses.     Heart sounds: Normal heart sounds. No  murmur heard. Pulmonary:     Effort: Pulmonary effort is normal.     Breath sounds: Normal breath sounds. No wheezing, rhonchi or rales.  Abdominal:     General: Abdomen is flat. Bowel sounds are normal. There is no distension.     Palpations: Abdomen is soft.     Tenderness: There is no abdominal tenderness.  Musculoskeletal:        General: No swelling or deformity.     Cervical back: Normal range of motion.  Skin:    General: Skin is warm and dry.     Capillary Refill: Capillary refill takes less than 2 seconds.  Neurological:     General: No focal deficit present.     Mental Status: He is alert and oriented to person, place, and time.     Motor: No weakness.  Psychiatric:        Mood and Affect: Mood normal.        Behavior: Behavior normal.        Thought Content: Thought content normal.       Assessment & Plan:   Problem List Items Addressed This Visit       Benign essential HTN   BP is significantly elevated today, 183/104 initially and 191/112 on repeat.  He is currently out of  all prescribed antihypertensive medications, which include amlodipine 10 mg daily, HCTZ 25 mg daily, and lisinopril 20 mg daily.  He has previously been well-controlled on this regimen.  Refills provided today.  I recommended resuming medications sequentially.      Uncontrolled type 2 diabetes mellitus with hyperglycemia (HCC)   A1c 6.4 on labs from February 2024.  He is currently prescribed metformin XR 1000 mg twice daily and Lantus 50 units nightly.  Currently endorsing polyuria.  Repeat A1c and urine microalbumin/creatinine ratio ordered today.      Hyperlipidemia associated with type 2 diabetes mellitus (HCC) - Primary   Currently prescribed atorvastatin 80 mg daily.  Refill provided today.  Repeat lipid panel ordered.      Polyuria   Acute visit today partially in the setting of polyuria, mostly at night.  Symptoms have been present for the last month.  He is not taking metformin or  insulin consistently, which could explain polyuria in the setting of hyperglycemia.  Will check urine studies today.  Further management pending results.      Diarrhea   Recent history of dull abdominal pain with intermittent, nonbloody diarrhea for several weeks.  Etiology unclear.  Will update basic labs today.  Recommend increasing daily fiber intake in order to improve stool consistency.  Will follow-up in 4-6 weeks for reassessment.       Meds ordered this encounter  Medications   amLODipine (NORVASC) 10 MG tablet    Sig: Take 1 tablet (10 mg total) by mouth daily.    Dispense:  90 tablet    Refill:  0   atorvastatin (LIPITOR) 80 MG tablet    Sig: Take 1 tablet (80 mg total) by mouth daily.    Dispense:  90 tablet    Refill:  0   hydrochlorothiazide (HYDRODIURIL) 25 MG tablet    Sig: Take 1 tablet (25 mg total) by mouth daily.    Dispense:  90 tablet    Refill:  0   insulin glargine (LANTUS SOLOSTAR) 100 UNIT/ML Solostar Pen    Sig: Inject 50 Units into the skin at bedtime.    Dispense:  15 mL    Refill:  PRN   lisinopril (ZESTRIL) 20 MG tablet    Sig: Take 1 tablet (20 mg total) by mouth daily.    Dispense:  30 tablet    Refill:  1   metFORMIN (GLUCOPHAGE-XR) 500 MG 24 hr tablet    Sig: Take 2 tablets (1,000 mg total) by mouth 2 (two) times daily with a meal.    Dispense:  360 tablet    Refill:  1    Return in about 4 weeks (around 06/18/2023).  Billie Lade, MD

## 2023-05-21 NOTE — Patient Instructions (Signed)
 It was a pleasure to see you today.  Thank you for giving [place] the opportunity to be involved in your care.  Below is a brief recap of your visit and next steps.  We will plan to see you again in 6 weeks.  Summary No medication changes today Sequentially resume blood pressure medicines (amlodipine, lisinopril, and hydrochlorothiazide) as we discussed Repeat labs, urine study ordered Follow up in 4-6 weeks

## 2023-05-22 ENCOUNTER — Encounter: Payer: Self-pay | Admitting: Internal Medicine

## 2023-05-23 LAB — CBC WITH DIFFERENTIAL/PLATELET
Basophils Absolute: 0 10*3/uL (ref 0.0–0.2)
Basos: 1 %
EOS (ABSOLUTE): 0.1 10*3/uL (ref 0.0–0.4)
Eos: 1 %
Hematocrit: 44.4 % (ref 37.5–51.0)
Hemoglobin: 14.5 g/dL (ref 13.0–17.7)
Immature Grans (Abs): 0.1 10*3/uL (ref 0.0–0.1)
Immature Granulocytes: 1 %
Lymphocytes Absolute: 2.8 10*3/uL (ref 0.7–3.1)
Lymphs: 41 %
MCH: 27.2 pg (ref 26.6–33.0)
MCHC: 32.7 g/dL (ref 31.5–35.7)
MCV: 83 fL (ref 79–97)
Monocytes Absolute: 0.3 10*3/uL (ref 0.1–0.9)
Monocytes: 5 %
Neutrophils Absolute: 3.6 10*3/uL (ref 1.4–7.0)
Neutrophils: 51 %
Platelets: 181 10*3/uL (ref 150–450)
RBC: 5.33 x10E6/uL (ref 4.14–5.80)
RDW: 12.9 % (ref 11.6–15.4)
WBC: 7 10*3/uL (ref 3.4–10.8)

## 2023-05-23 LAB — CMP14+EGFR
ALT: 14 IU/L (ref 0–44)
AST: 15 IU/L (ref 0–40)
Albumin: 4.4 g/dL (ref 3.8–4.9)
Alkaline Phosphatase: 79 IU/L (ref 44–121)
BUN/Creatinine Ratio: 16 (ref 9–20)
BUN: 18 mg/dL (ref 6–24)
Bilirubin Total: 0.4 mg/dL (ref 0.0–1.2)
CO2: 25 mmol/L (ref 20–29)
Calcium: 9.6 mg/dL (ref 8.7–10.2)
Chloride: 99 mmol/L (ref 96–106)
Creatinine, Ser: 1.16 mg/dL (ref 0.76–1.27)
Globulin, Total: 2.3 g/dL (ref 1.5–4.5)
Glucose: 322 mg/dL — ABNORMAL HIGH (ref 70–99)
Potassium: 4.6 mmol/L (ref 3.5–5.2)
Sodium: 139 mmol/L (ref 134–144)
Total Protein: 6.7 g/dL (ref 6.0–8.5)
eGFR: 73 mL/min/{1.73_m2} (ref >59)

## 2023-05-23 LAB — TSH+FREE T4
Free T4: 1.23 ng/dL (ref 0.82–1.77)
TSH: 1.07 u[IU]/mL (ref 0.450–4.500)

## 2023-05-23 LAB — UA/M W/RFLX CULTURE, ROUTINE
Bilirubin, UA: NEGATIVE
Ketones, UA: NEGATIVE
Leukocytes,UA: NEGATIVE
Nitrite, UA: NEGATIVE
RBC, UA: NEGATIVE
Specific Gravity, UA: >=1.03 — AB (ref 1.005–1.030)
Urobilinogen, Ur: 0.2 mg/dL (ref 0.2–1.0)
pH, UA: 5.5 (ref 5.0–7.5)

## 2023-05-23 LAB — LIPID PANEL
Chol/HDL Ratio: 5.2 ratio — ABNORMAL HIGH (ref 0.0–5.0)
Cholesterol, Total: 256 mg/dL — ABNORMAL HIGH (ref 100–199)
HDL: 49 mg/dL (ref >39)
LDL Chol Calc (NIH): 155 mg/dL — ABNORMAL HIGH (ref 0–99)
Triglycerides: 281 mg/dL — ABNORMAL HIGH (ref 0–149)
VLDL Cholesterol Cal: 52 mg/dL — ABNORMAL HIGH (ref 5–40)

## 2023-05-23 LAB — MICROALBUMIN / CREATININE URINE RATIO
Creatinine, Urine: 135.3 mg/dL
Microalb/Creat Ratio: 60 mg/g{creat} — ABNORMAL HIGH (ref 0–29)
Microalbumin, Urine: 81.6 ug/mL

## 2023-05-23 LAB — B12 AND FOLATE PANEL
Folate: 12.6 ng/mL (ref >3.0)
Vitamin B-12: 503 pg/mL (ref 232–1245)

## 2023-05-23 LAB — HEMOGLOBIN A1C
Est. average glucose Bld gHb Est-mCnc: 243 mg/dL
Hgb A1c MFr Bld: 10.1 % — ABNORMAL HIGH (ref 4.8–5.6)

## 2023-05-23 LAB — VITAMIN D 25 HYDROXY (VIT D DEFICIENCY, FRACTURES): Vit D, 25-Hydroxy: 23.3 ng/mL — ABNORMAL LOW (ref 30.0–100.0)

## 2023-05-23 LAB — MICROSCOPIC EXAMINATION
Bacteria, UA: NONE SEEN
Casts: NONE SEEN /LPF
Epithelial Cells (non renal): NONE SEEN /HPF (ref 0–10)
RBC, Urine: NONE SEEN /HPF (ref 0–2)
WBC, UA: NONE SEEN /HPF (ref 0–5)

## 2023-06-17 ENCOUNTER — Encounter: Payer: Self-pay | Admitting: Internal Medicine

## 2023-06-17 DIAGNOSIS — R3589 Other polyuria: Secondary | ICD-10-CM | POA: Insufficient documentation

## 2023-06-17 DIAGNOSIS — R197 Diarrhea, unspecified: Secondary | ICD-10-CM | POA: Insufficient documentation

## 2023-06-17 NOTE — Assessment & Plan Note (Signed)
 BP is significantly elevated today, 183/104 initially and 191/112 on repeat.  He is currently out of all prescribed antihypertensive medications, which include amlodipine 10 mg daily, HCTZ 25 mg daily, and lisinopril 20 mg daily.  He has previously been well-controlled on this regimen.  Refills provided today.  I recommended resuming medications sequentially.

## 2023-06-17 NOTE — Assessment & Plan Note (Signed)
 Recent history of dull abdominal pain with intermittent, nonbloody diarrhea for several weeks.  Etiology unclear.  Will update basic labs today.  Recommend increasing daily fiber intake in order to improve stool consistency.  Will follow-up in 4-6 weeks for reassessment.

## 2023-06-17 NOTE — Assessment & Plan Note (Signed)
 Acute visit today partially in the setting of polyuria, mostly at night.  Symptoms have been present for the last month.  He is not taking metformin or insulin consistently, which could explain polyuria in the setting of hyperglycemia.  Will check urine studies today.  Further management pending results.

## 2023-06-17 NOTE — Assessment & Plan Note (Signed)
 A1c 6.4 on labs from February 2024.  He is currently prescribed metformin XR 1000 mg twice daily and Lantus 50 units nightly.  Currently endorsing polyuria.  Repeat A1c and urine microalbumin/creatinine ratio ordered today.

## 2023-06-17 NOTE — Assessment & Plan Note (Signed)
 Currently prescribed atorvastatin 80 mg daily.  Refill provided today.  Repeat lipid panel ordered.

## 2023-06-26 ENCOUNTER — Ambulatory Visit: Payer: MEDICAID | Admitting: Internal Medicine

## 2023-06-26 ENCOUNTER — Encounter: Payer: Self-pay | Admitting: Internal Medicine

## 2023-06-26 VITALS — BP 128/86 | HR 99 | Ht 68.0 in | Wt 171.8 lb

## 2023-06-26 DIAGNOSIS — I10 Essential (primary) hypertension: Secondary | ICD-10-CM

## 2023-06-26 DIAGNOSIS — E785 Hyperlipidemia, unspecified: Secondary | ICD-10-CM

## 2023-06-26 DIAGNOSIS — R3589 Other polyuria: Secondary | ICD-10-CM

## 2023-06-26 DIAGNOSIS — E559 Vitamin D deficiency, unspecified: Secondary | ICD-10-CM

## 2023-06-26 DIAGNOSIS — E1169 Type 2 diabetes mellitus with other specified complication: Secondary | ICD-10-CM | POA: Diagnosis not present

## 2023-06-26 DIAGNOSIS — E1165 Type 2 diabetes mellitus with hyperglycemia: Secondary | ICD-10-CM | POA: Diagnosis not present

## 2023-06-26 DIAGNOSIS — Z7984 Long term (current) use of oral hypoglycemic drugs: Secondary | ICD-10-CM

## 2023-06-26 MED ORDER — LANTUS SOLOSTAR 100 UNIT/ML ~~LOC~~ SOPN: 50.0000 [IU] | PEN_INJECTOR | Freq: Every day | SUBCUTANEOUS | 99 refills | Status: DC

## 2023-06-26 MED ORDER — AMLODIPINE BESYLATE 10 MG PO TABS
10.0000 mg | ORAL_TABLET | Freq: Every day | ORAL | 3 refills | Status: DC
Start: 2023-06-26 — End: 2023-09-25

## 2023-06-26 MED ORDER — TIRZEPATIDE 2.5 MG/0.5ML ~~LOC~~ SOAJ: 2.5000 mg | SUBCUTANEOUS | 0 refills | Status: DC

## 2023-06-26 MED ORDER — LISINOPRIL 20 MG PO TABS
20.0000 mg | ORAL_TABLET | Freq: Every day | ORAL | 3 refills | Status: DC
Start: 2023-06-26 — End: 2023-09-25

## 2023-06-26 MED ORDER — ATORVASTATIN CALCIUM 80 MG PO TABS
80.0000 mg | ORAL_TABLET | Freq: Every day | ORAL | 3 refills | Status: DC
Start: 2023-06-26 — End: 2023-09-25

## 2023-06-26 MED ORDER — HYDROCHLOROTHIAZIDE 25 MG PO TABS: 25.0000 mg | ORAL_TABLET | Freq: Every day | ORAL | 1 refills | Status: DC

## 2023-06-26 MED ORDER — METFORMIN HCL ER 500 MG PO TB24
1000.0000 mg | ORAL_TABLET | Freq: Two times a day (BID) | ORAL | 3 refills | Status: DC
Start: 2023-06-26 — End: 2023-09-25

## 2023-06-26 NOTE — Assessment & Plan Note (Signed)
 BP was significantly elevated at his last visit.  He was out of all antihypertensive medications.  Refills provided.  BP today is 128/86.  Current antihypertensive regimen includes amlodipine 10 mg daily, HCTZ 25 mg daily, and lisinopril 20 mg daily.  No medication changes are indicated at this time.

## 2023-06-26 NOTE — Assessment & Plan Note (Signed)
Noted on recent labs.  Daily vitamin D supplementation recommended. 

## 2023-06-26 NOTE — Assessment & Plan Note (Signed)
 A1c 10.1 on labs from last month.  He is currently prescribed metformin XR 1000 mg twice daily and Lantus 25 units nightly.  Endorsing polyuria.  Previous workup not concerning for UTI.  Moderately increased urine microalbumin/creatinine ratio.  We discussed the need to significantly improve diabetes control.  Through shared decision-making, Mounjaro 2.5 mg weekly has been prescribed today.  Will plan to further increase to 5 mg weekly after 4 weeks.  Repeat A1c at follow-up in 3 months.

## 2023-06-26 NOTE — Patient Instructions (Signed)
 It was a pleasure to see you today.  Thank you for giving us  the opportunity to be involved in your care.  Below is a brief recap of your visit and next steps.  We will plan to see you again in 3 months.  Summary Add Mounjaro for improved diabetes management Additional medications refilled Follow up in 3 months

## 2023-06-26 NOTE — Assessment & Plan Note (Signed)
 Lipid panel updated last month.  Total cholesterol 256 and LDL 155.  He was most recently prescribed atorvastatin 80 mg daily but had been off medication.  Refill provided.  Will need repeat lipid panel at follow-up in 3 months.

## 2023-06-26 NOTE — Assessment & Plan Note (Signed)
 He continues to endorse polyuria, mostly at night.  Previous UA not concerning for UTI but did show glucosuria.  Symptoms are largely unchanged today.  Likely due to poorly controlled diabetes mellitus.  Addressing as above.  Denies problems with weak stream, dribbling, or straining to urinate.

## 2023-06-26 NOTE — Progress Notes (Signed)
 Established Patient Office Visit  Subjective   Patient ID: Troy Gordon, male    DOB: [birth year]  Age: 58 y.o. MRN: [id number]  Chief Complaint  Patient presents with   Care Management    Follow up , still having issues with frequent urination    Troy Gordon returns to care today for follow-up.  He was last evaluated by me on 3/11 for an acute visit endorsing multiple concerns.  He reported polyuria as well as a several week history of diarrhea.  He was also out of all antihypertensive medications and his blood pressure was significantly elevated.  Repeat labs were ordered, antihypertensive medications refilled, and 4-6-week follow-up arranged for reassessment.  There have been no acute interval events.  Troy Gordon reports feeling well today.  Diarrhea has resolved but he continues to urinate frequently.  He has been taking his medications as prescribed.  He has no additional concerns to discuss.  Past Medical History:  Diagnosis Date   Depression    Diabetes mellitus (HCC)    Dyslipidemia    Hyperlipidemia    Hypertension    Past Surgical History:  Procedure Laterality Date   CIRCUMCISION     as an adult   Social History   Tobacco Use   Smoking status: Former    Types: Cigarettes   Smokeless tobacco: Never   Tobacco comments:    1 cig every 5 weeks  Vaping Use   Vaping status: Never Used  Substance Use Topics   Alcohol use: Not Currently    Comment: occasionally beer   Drug use: Yes    Types: Marijuana, Cocaine   Family History  Problem Relation Age of Onset   Diabetes Maternal Grandmother    Hypertension Maternal Grandmother    Hypertension Father    No Known Allergies  Review of Systems  Constitutional:  Negative for chills and fever.  HENT:  Negative for sore throat.   Respiratory:  Negative for cough and shortness of breath.   Cardiovascular:  Negative for chest pain, palpitations and leg swelling.  Gastrointestinal:  Negative for abdominal pain, blood  in stool, constipation, diarrhea, nausea and vomiting.  Genitourinary:  Positive for frequency. Negative for dysuria and hematuria.  Musculoskeletal:  Negative for myalgias.  Skin:  Negative for itching and rash.  Neurological:  Negative for dizziness and headaches.  Psychiatric/Behavioral:  Negative for depression and suicidal ideas.      Objective:     BP 128/86   Pulse 99   Ht 5\' 8"  (1.727 m)   Wt 171 lb 12.8 oz (77.9 kg)   SpO2 96%   BMI 26.12 kg/m  BP Readings from Last 3 Encounters:  06/26/23 128/86  05/21/23 (!) 183/104  04/15/23 (!) 146/89   Physical Exam Vitals reviewed.  Constitutional:      General: He is not in acute distress.    Appearance: Normal appearance. He is not ill-appearing.  HENT:     Head: Normocephalic and atraumatic.     Right Ear: External ear normal.     Left Ear: External ear normal.     Nose: Nose normal. No congestion or rhinorrhea.     Mouth/Throat:     Mouth: Mucous membranes are moist.     Pharynx: Oropharynx is clear.  Eyes:     General: No scleral icterus.    Extraocular Movements: Extraocular movements intact.     Conjunctiva/sclera: Conjunctivae normal.     Pupils: Pupils are equal, round, and reactive to light.  Cardiovascular:     Rate and Rhythm: Normal rate and regular rhythm.     Pulses: Normal pulses.     Heart sounds: Normal heart sounds. No murmur heard. Pulmonary:     Effort: Pulmonary effort is normal.     Breath sounds: Normal breath sounds. No wheezing, rhonchi or rales.  Abdominal:     General: Abdomen is flat. Bowel sounds are normal. There is no distension.     Palpations: Abdomen is soft.     Tenderness: There is no abdominal tenderness.  Musculoskeletal:        General: No swelling or deformity.     Cervical back: Normal range of motion.  Skin:    General: Skin is warm and dry.     Capillary Refill: Capillary refill takes less than 2 seconds.  Neurological:     General: No focal deficit present.      Mental Status: He is alert and oriented to person, place, and time.     Motor: No weakness.  Psychiatric:        Mood and Affect: Mood normal.        Behavior: Behavior normal.        Thought Content: Thought content normal.   Last CBC Lab Results  Component Value Date   WBC 7.0 05/21/2023   HGB 14.5 05/21/2023   HCT 44.4 05/21/2023   MCV 83 05/21/2023   MCH 27.2 05/21/2023   RDW 12.9 05/21/2023   PLT 181 05/21/2023   Last metabolic panel Lab Results  Component Value Date   GLUCOSE 322 (H) 05/21/2023   NA 139 05/21/2023   K 4.6 05/21/2023   CL 99 05/21/2023   CO2 25 05/21/2023   BUN 18 05/21/2023   CREATININE 1.16 05/21/2023   EGFR 73 05/21/2023   CALCIUM 9.6 05/21/2023   PROT 6.7 05/21/2023   ALBUMIN 4.4 05/21/2023   LABGLOB 2.3 05/21/2023   BILITOT 0.4 05/21/2023   ALKPHOS 79 05/21/2023   AST 15 05/21/2023   ALT 14 05/21/2023   ANIONGAP 10 04/14/2023   Last lipids Lab Results  Component Value Date   CHOL 256 (H) 05/21/2023   HDL 49 05/21/2023   LDLCALC 155 (H) 05/21/2023   TRIG 281 (H) 05/21/2023   CHOLHDL 5.2 (H) 05/21/2023   Last hemoglobin A1c Lab Results  Component Value Date   HGBA1C 10.1 (H) 05/21/2023   Last thyroid functions Lab Results  Component Value Date   TSH 1.070 05/21/2023   Last vitamin D Lab Results  Component Value Date   VD25OH 23.3 (L) 05/21/2023   Last vitamin B12 and Folate Lab Results  Component Value Date   VITAMINB12 503 05/21/2023   FOLATE 12.6 05/21/2023   The 10-year ASCVD risk score (Arnett DK, et al., 2019) is: 24.8%    Assessment & Plan:   Problem List Items Addressed This Visit       Benign essential HTN   BP was significantly elevated at his last visit.  He was out of all antihypertensive medications.  Refills provided.  BP today is 128/86.  Current antihypertensive regimen includes amlodipine 10 mg daily, HCTZ 25 mg daily, and lisinopril 20 mg daily.  No medication changes are indicated at this time.       Uncontrolled type 2 diabetes mellitus with hyperglycemia (HCC) - Primary   A1c 10.1 on labs from last month.  He is currently prescribed metformin XR 1000 mg twice daily and Lantus 25 units nightly.  Endorsing polyuria.  Previous workup not concerning for UTI.  Moderately increased urine microalbumin/creatinine ratio.  We discussed the need to significantly improve diabetes control.  Through shared decision-making, Mounjaro 2.5 mg weekly has been prescribed today.  Will plan to further increase to 5 mg weekly after 4 weeks.  Repeat A1c at follow-up in 3 months.      Hyperlipidemia associated with type 2 diabetes mellitus (HCC)   Lipid panel updated last month.  Total cholesterol 256 and LDL 155.  He was most recently prescribed atorvastatin 80 mg daily but had been off medication.  Refill provided.  Will need repeat lipid panel at follow-up in 3 months.      Polyuria   He continues to endorse polyuria, mostly at night.  Previous UA not concerning for UTI but did show glucosuria.  Symptoms are largely unchanged today.  Likely due to poorly controlled diabetes mellitus.  Addressing as above.  Denies problems with weak stream, dribbling, or straining to urinate.      Vitamin D insufficiency   Noted on recent labs.  Daily vitamin D supplementation recommended.       Return in about 3 months (around 09/25/2023).    Tobi Fortes, MD

## 2023-06-27 ENCOUNTER — Telehealth: Payer: Self-pay | Admitting: Pharmacy Technician

## 2023-06-27 ENCOUNTER — Other Ambulatory Visit (HOSPITAL_COMMUNITY): Payer: Self-pay

## 2023-06-27 DIAGNOSIS — E1165 Type 2 diabetes mellitus with hyperglycemia: Secondary | ICD-10-CM

## 2023-06-27 MED ORDER — OZEMPIC (0.25 OR 0.5 MG/DOSE) 2 MG/3ML ~~LOC~~ SOPN: 0.2500 mg | PEN_INJECTOR | SUBCUTANEOUS | 0 refills | Status: AC

## 2023-06-27 NOTE — Telephone Encounter (Signed)
 Pharmacy Patient Advocate Encounter   Received notification from CoverMyMeds that prior authorization for Mounjaro 2.5MG /0.5ML auto-injectors is required/requested.   Insurance verification completed.   The patient is insured through [employer] [place] .   Per test claim:  SEE BELOW is preferred by the insurance.  If suggested medication is appropriate, Please send in a new RX and discontinue this one. If not, please advise as to why it's not appropriate so that we may request a Prior Authorization. Please note, some preferred medications may still require a PA.  If the suggested medications have not been trialed and there are no contraindications to their use, the PA will not be submitted, as it will not be approved.  Pt has to try/fail 2 preferred drugs.

## 2023-09-25 ENCOUNTER — Ambulatory Visit: Payer: MEDICAID

## 2023-09-25 VITALS — BP 148/88 | HR 102 | Ht 68.0 in | Wt 174.1 lb

## 2023-09-25 DIAGNOSIS — E1165 Type 2 diabetes mellitus with hyperglycemia: Secondary | ICD-10-CM

## 2023-09-25 DIAGNOSIS — N521 Erectile dysfunction due to diseases classified elsewhere: Secondary | ICD-10-CM

## 2023-09-25 DIAGNOSIS — I10 Essential (primary) hypertension: Secondary | ICD-10-CM

## 2023-09-25 DIAGNOSIS — Z7984 Long term (current) use of oral hypoglycemic drugs: Secondary | ICD-10-CM

## 2023-09-25 DIAGNOSIS — E559 Vitamin D deficiency, unspecified: Secondary | ICD-10-CM

## 2023-09-25 DIAGNOSIS — E1169 Type 2 diabetes mellitus with other specified complication: Secondary | ICD-10-CM | POA: Diagnosis not present

## 2023-09-25 DIAGNOSIS — E785 Hyperlipidemia, unspecified: Secondary | ICD-10-CM

## 2023-09-25 MED ORDER — ATORVASTATIN CALCIUM 80 MG PO TABS: 80.0000 mg | ORAL_TABLET | Freq: Every day | ORAL | 2 refills | Status: AC

## 2023-09-25 MED ORDER — LANTUS SOLOSTAR 100 UNIT/ML ~~LOC~~ SOPN: 25.0000 [IU] | PEN_INJECTOR | Freq: Every day | SUBCUTANEOUS | 2 refills | Status: AC

## 2023-09-25 MED ORDER — OZEMPIC (0.25 OR 0.5 MG/DOSE) 2 MG/3ML ~~LOC~~ SOPN
0.2500 mg | PEN_INJECTOR | SUBCUTANEOUS | 2 refills | Status: AC
Start: 2023-09-25 — End: ?

## 2023-09-25 MED ORDER — SILDENAFIL CITRATE 50 MG PO TABS
50.0000 mg | ORAL_TABLET | Freq: Every day | ORAL | 2 refills | Status: AC | PRN
Start: 2023-09-25 — End: ?

## 2023-09-25 MED ORDER — FREESTYLE LIBRE 3 SENSOR MISC
5 refills | Status: AC
Start: 2023-09-25 — End: ?

## 2023-09-25 MED ORDER — AMLODIPINE BESYLATE 10 MG PO TABS
10.0000 mg | ORAL_TABLET | Freq: Every day | ORAL | 2 refills | Status: AC
Start: 2023-09-25 — End: ?

## 2023-09-25 MED ORDER — LISINOPRIL 20 MG PO TABS: 20.0000 mg | ORAL_TABLET | Freq: Every day | ORAL | 3 refills | Status: AC

## 2023-09-25 MED ORDER — HYDROCHLOROTHIAZIDE 25 MG PO TABS: 25.0000 mg | ORAL_TABLET | Freq: Every day | ORAL | 2 refills | Status: AC

## 2023-09-25 MED ORDER — METFORMIN HCL ER 500 MG PO TB24: 1000.0000 mg | ORAL_TABLET | Freq: Two times a day (BID) | ORAL | 2 refills | Status: AC

## 2023-09-25 NOTE — Patient Instructions (Signed)
Schedule diabetic eye exam

## 2023-09-25 NOTE — Progress Notes (Signed)
 Established Patient Office Visit  Subjective   Patient ID: Troy Gordon, male    DOB: 1965-08-16  Age: 58 y.o. MRN: [id number]  Chief Complaint  Patient presents with   Medical Management of Chronic Issues    Follow up, pt states needs refill on all medications     HPI  Patient Active Problem List   Diagnosis Date Noted   Vitamin D  insufficiency 06/26/2023   Polyuria 06/17/2023   Diarrhea 06/17/2023   Colon cancer screening 09/27/2022   Erectile dysfunction 09/27/2022   Chronic right shoulder pain 02/14/2022   Need for hepatitis C screening test 02/14/2022   Screening for HIV (human immunodeficiency virus) 02/14/2022   Moderate cannabis use disorder (HCC) 03/13/2021   Substance induced mood disorder (HCC)    Cocaine use disorder, severe, dependence (HCC) 10/26/2020   Severe recurrent major depression without psychotic features (HCC) 10/25/2020   Hyperlipidemia associated with type 2 diabetes mellitus (HCC) 06/07/2017   Benign essential HTN 11/20/2016   Uncontrolled type 2 diabetes mellitus with hyperglycemia (HCC) 11/20/2016      ROS    Objective:     BP (!) 148/88   Pulse (!) 102   Ht 5' 8 (1.727 m)   Wt 174 lb 1.9 oz (79 kg)   SpO2 97%   BMI 26.47 kg/m  BP Readings from Last 3 Encounters:  09/25/23 (!) 148/88  06/26/23 128/86  05/21/23 (!) 183/104   Wt Readings from Last 3 Encounters:  09/25/23 174 lb 1.9 oz (79 kg)  06/26/23 171 lb 12.8 oz (77.9 kg)  05/21/23 176 lb 3.2 oz (79.9 kg)     Physical Exam Vitals and nursing note reviewed.  Constitutional:      Appearance: Normal appearance.  HENT:     Head: Normocephalic.  Eyes:     Extraocular Movements: Extraocular movements intact.     Pupils: Pupils are equal, round, and reactive to light.  Cardiovascular:     Rate and Rhythm: Normal rate and regular rhythm.  Pulmonary:     Effort: Pulmonary effort is normal.     Breath sounds: Normal breath sounds.  Musculoskeletal:     Cervical back:  Normal range of motion and neck supple.  Neurological:     Mental Status: He is alert and oriented to person, place, and time.  Psychiatric:        Mood and Affect: Mood normal.        Thought Content: Thought content normal.      No results found for any visits on 09/25/23.    The 10-year ASCVD risk score (Arnett DK, et al., 2019) is: 31.4%    Assessment & Plan:   Problem List Items Addressed This Visit       Cardiovascular and Mediastinum   Benign essential HTN   BP is elevated today. Advised to work on a low-sodium diet and regular exercise to help improve.  Continue with current antihypertensive regimen of amlodipine  10 mg daily, HCTZ 25 mg daily, and lisinopril  20 mg daily.  No medication changes are indicated at this time.      Relevant Medications   amLODipine  (NORVASC ) 10 MG tablet   atorvastatin  (LIPITOR ) 80 MG tablet   hydrochlorothiazide  (HYDRODIURIL ) 25 MG tablet   lisinopril  (ZESTRIL ) 20 MG tablet   sildenafil  (VIAGRA ) 50 MG tablet     Endocrine   Uncontrolled type 2 diabetes mellitus with hyperglycemia (HCC) - Primary   He is currently prescribed metformin  XR 1000 mg twice daily  and Lantus  25 units nightly.  Mounjaro  increased to 5 mg weekly.  Repeat A1c today.       Relevant Medications   atorvastatin  (LIPITOR ) 80 MG tablet   insulin  glargine (LANTUS  SOLOSTAR) 100 UNIT/ML Solostar Pen   lisinopril  (ZESTRIL ) 20 MG tablet   metFORMIN  (GLUCOPHAGE -XR) 500 MG 24 hr tablet   Semaglutide ,0.25 or 0.5MG /DOS, (OZEMPIC , 0.25 OR 0.5 MG/DOSE,) 2 MG/3ML SOPN   Continuous Glucose Sensor (FREESTYLE LIBRE 3 SENSOR) MISC   Hyperlipidemia associated with type 2 diabetes mellitus (HCC)   Recheck labs today.  Refill of atorvastatin  80 mg provided.  Follow-up according to lab results.      Relevant Medications   amLODipine  (NORVASC ) 10 MG tablet   atorvastatin  (LIPITOR ) 80 MG tablet   hydrochlorothiazide  (HYDRODIURIL ) 25 MG tablet   insulin  glargine (LANTUS  SOLOSTAR)  100 UNIT/ML Solostar Pen   lisinopril  (ZESTRIL ) 20 MG tablet   metFORMIN  (GLUCOPHAGE -XR) 500 MG 24 hr tablet   sildenafil  (VIAGRA ) 50 MG tablet   Semaglutide ,0.25 or 0.5MG /DOS, (OZEMPIC , 0.25 OR 0.5 MG/DOSE,) 2 MG/3ML SOPN     Other   Vitamin D  insufficiency   Noted on recent labs.  Daily vitamin D  supplementation recommended.      Other Visit Diagnoses       Erectile dysfunction associated with type 2 diabetes mellitus (HCC)       Relevant Medications   atorvastatin  (LIPITOR ) 80 MG tablet   insulin  glargine (LANTUS  SOLOSTAR) 100 UNIT/ML Solostar Pen   lisinopril  (ZESTRIL ) 20 MG tablet   metFORMIN  (GLUCOPHAGE -XR) 500 MG 24 hr tablet   sildenafil  (VIAGRA ) 50 MG tablet   Semaglutide ,0.25 or 0.5MG /DOS, (OZEMPIC , 0.25 OR 0.5 MG/DOSE,) 2 MG/3ML SOPN       Return in about 3 months (around 12/26/2023) for chronic follow-up with PCP.    Leita Longs, FNP

## 2023-09-26 ENCOUNTER — Ambulatory Visit: Payer: MEDICAID

## 2023-09-30 ENCOUNTER — Telehealth: Payer: Self-pay | Admitting: Pharmacy Technician

## 2023-09-30 ENCOUNTER — Other Ambulatory Visit (HOSPITAL_COMMUNITY): Payer: Self-pay

## 2023-09-30 NOTE — Telephone Encounter (Signed)
 Pharmacy Patient Advocate Encounter   Received notification from Onbase that prior authorization for Freestyle Libre 3 Plus sensors is required/requested.   Insurance verification completed.   The patient is insured through [employer] [place] [employer] [place] .   Per test claim: PA required; PA submitted to above mentioned insurance via LATENT Key/confirmation #/EOC [id number] Status is pending

## 2023-10-01 ENCOUNTER — Other Ambulatory Visit (HOSPITAL_COMMUNITY): Payer: Self-pay

## 2023-10-01 NOTE — Telephone Encounter (Signed)
 Pharmacy Patient Advocate Encounter  Received notification from [employer] that Prior Authorization for [facility] 3 Plus sensors  has been APPROVED from 10/01/2023 to 04/02/2024. Ran test claim, Copay is $0.00. This test claim was processed through [facility]- copay amounts may vary at other pharmacies due to pharmacy/plan contracts, or as the patient moves through the different stages of their insurance plan.   PA #/Case ID/Reference #: [id number]

## 2023-10-02 NOTE — Assessment & Plan Note (Signed)
 BP is elevated today. Advised to work on a low-sodium diet and regular exercise to help improve.  Continue with current antihypertensive regimen of amlodipine  10 mg daily, HCTZ 25 mg daily, and lisinopril  20 mg daily.  No medication changes are indicated at this time.

## 2023-10-02 NOTE — Assessment & Plan Note (Signed)
 Recheck labs today.  Refill of atorvastatin  80 mg provided.  Follow-up according to lab results.

## 2023-10-02 NOTE — Assessment & Plan Note (Signed)
Noted on recent labs.  Daily vitamin D supplementation recommended. 

## 2023-10-02 NOTE — Assessment & Plan Note (Signed)
 He is currently prescribed metformin  XR 1000 mg twice daily and Lantus  25 units nightly.  Mounjaro  increased to 5 mg weekly.  Repeat A1c today.

## 2023-10-05 IMAGING — DX DG CHEST 2V
2 series · 2 of 2 positions shown · non-contrast
Comparison: None.

CLINICAL DATA: Lightheaded and shortness of breath.

EXAM:
CHEST - 2 VIEW

[chest pa]
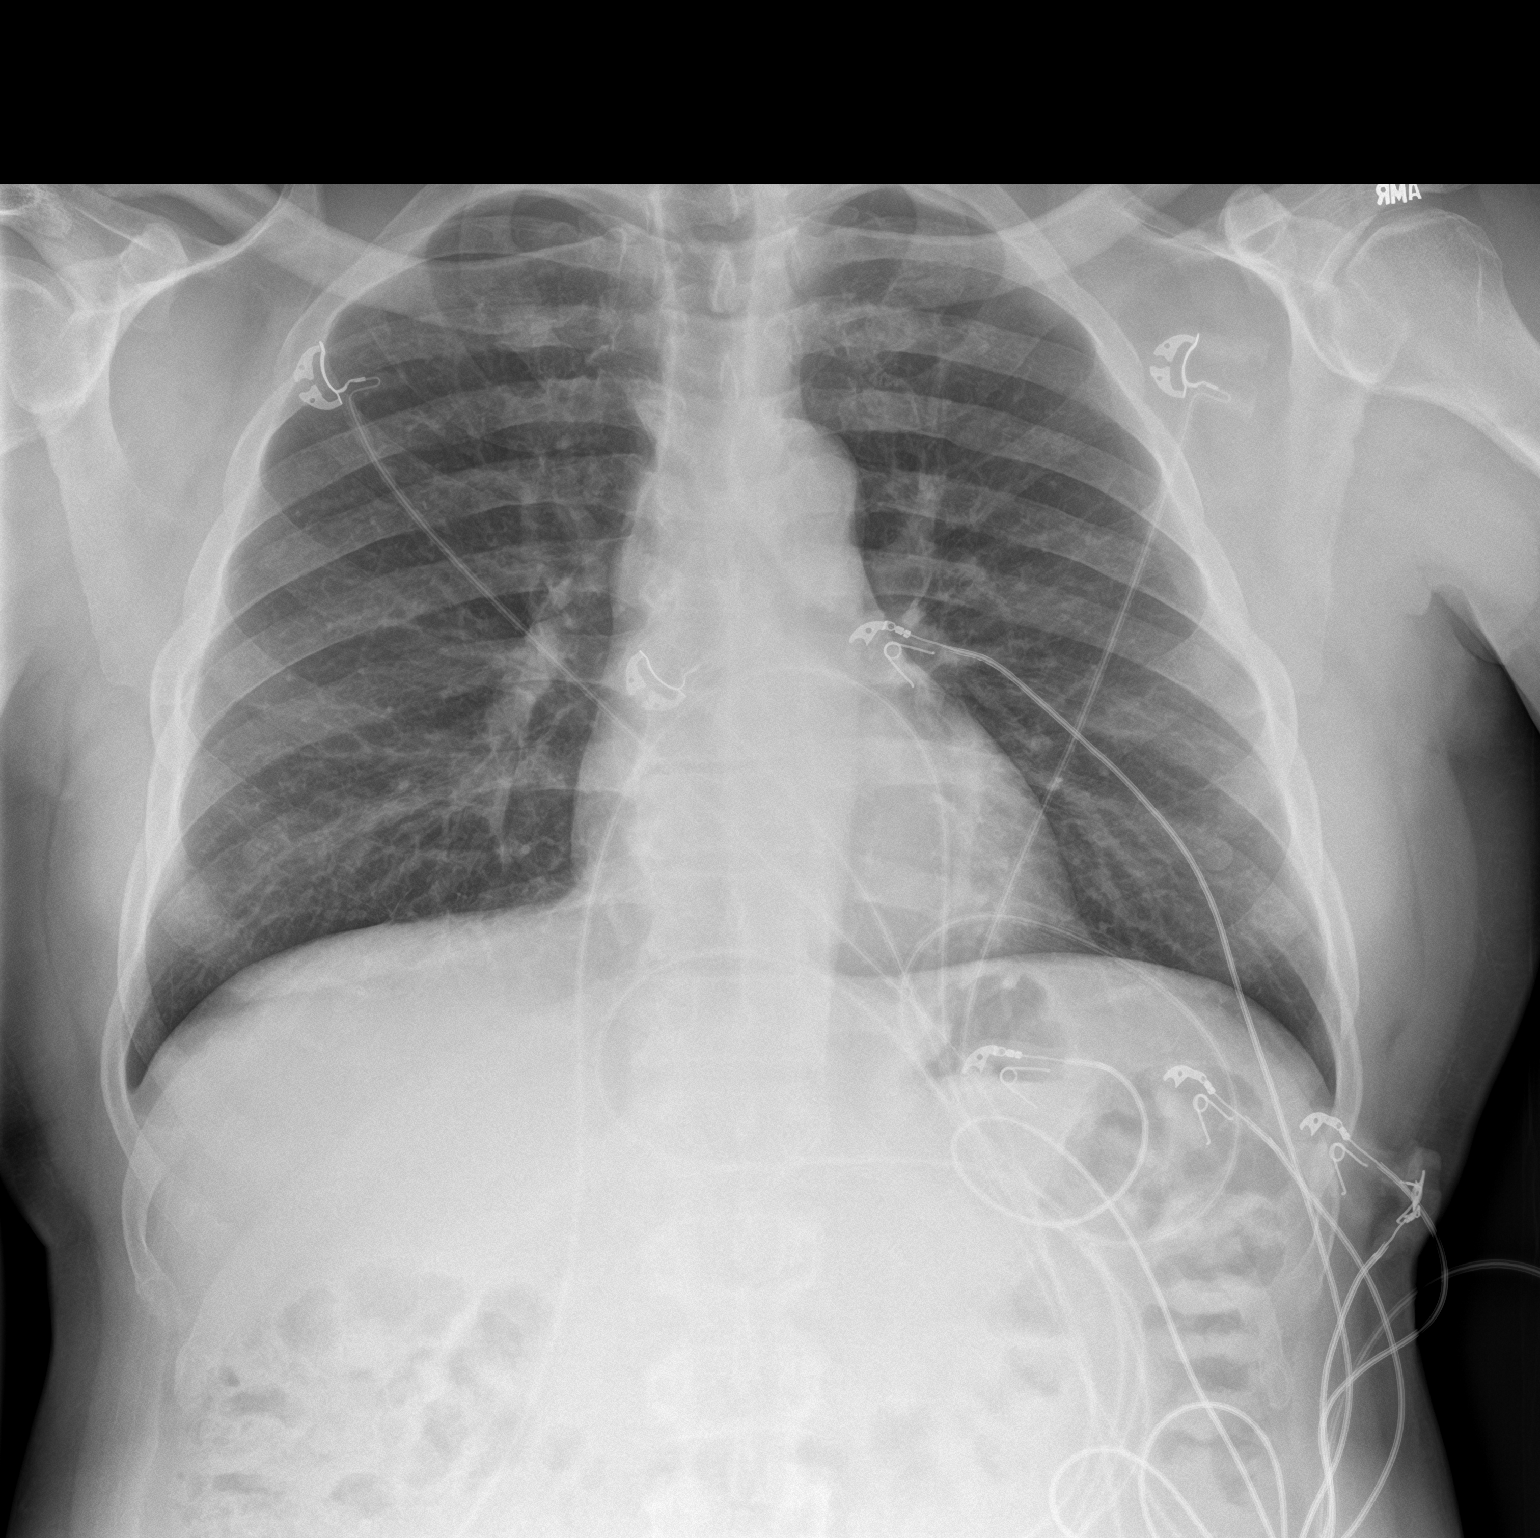

[chest lat]
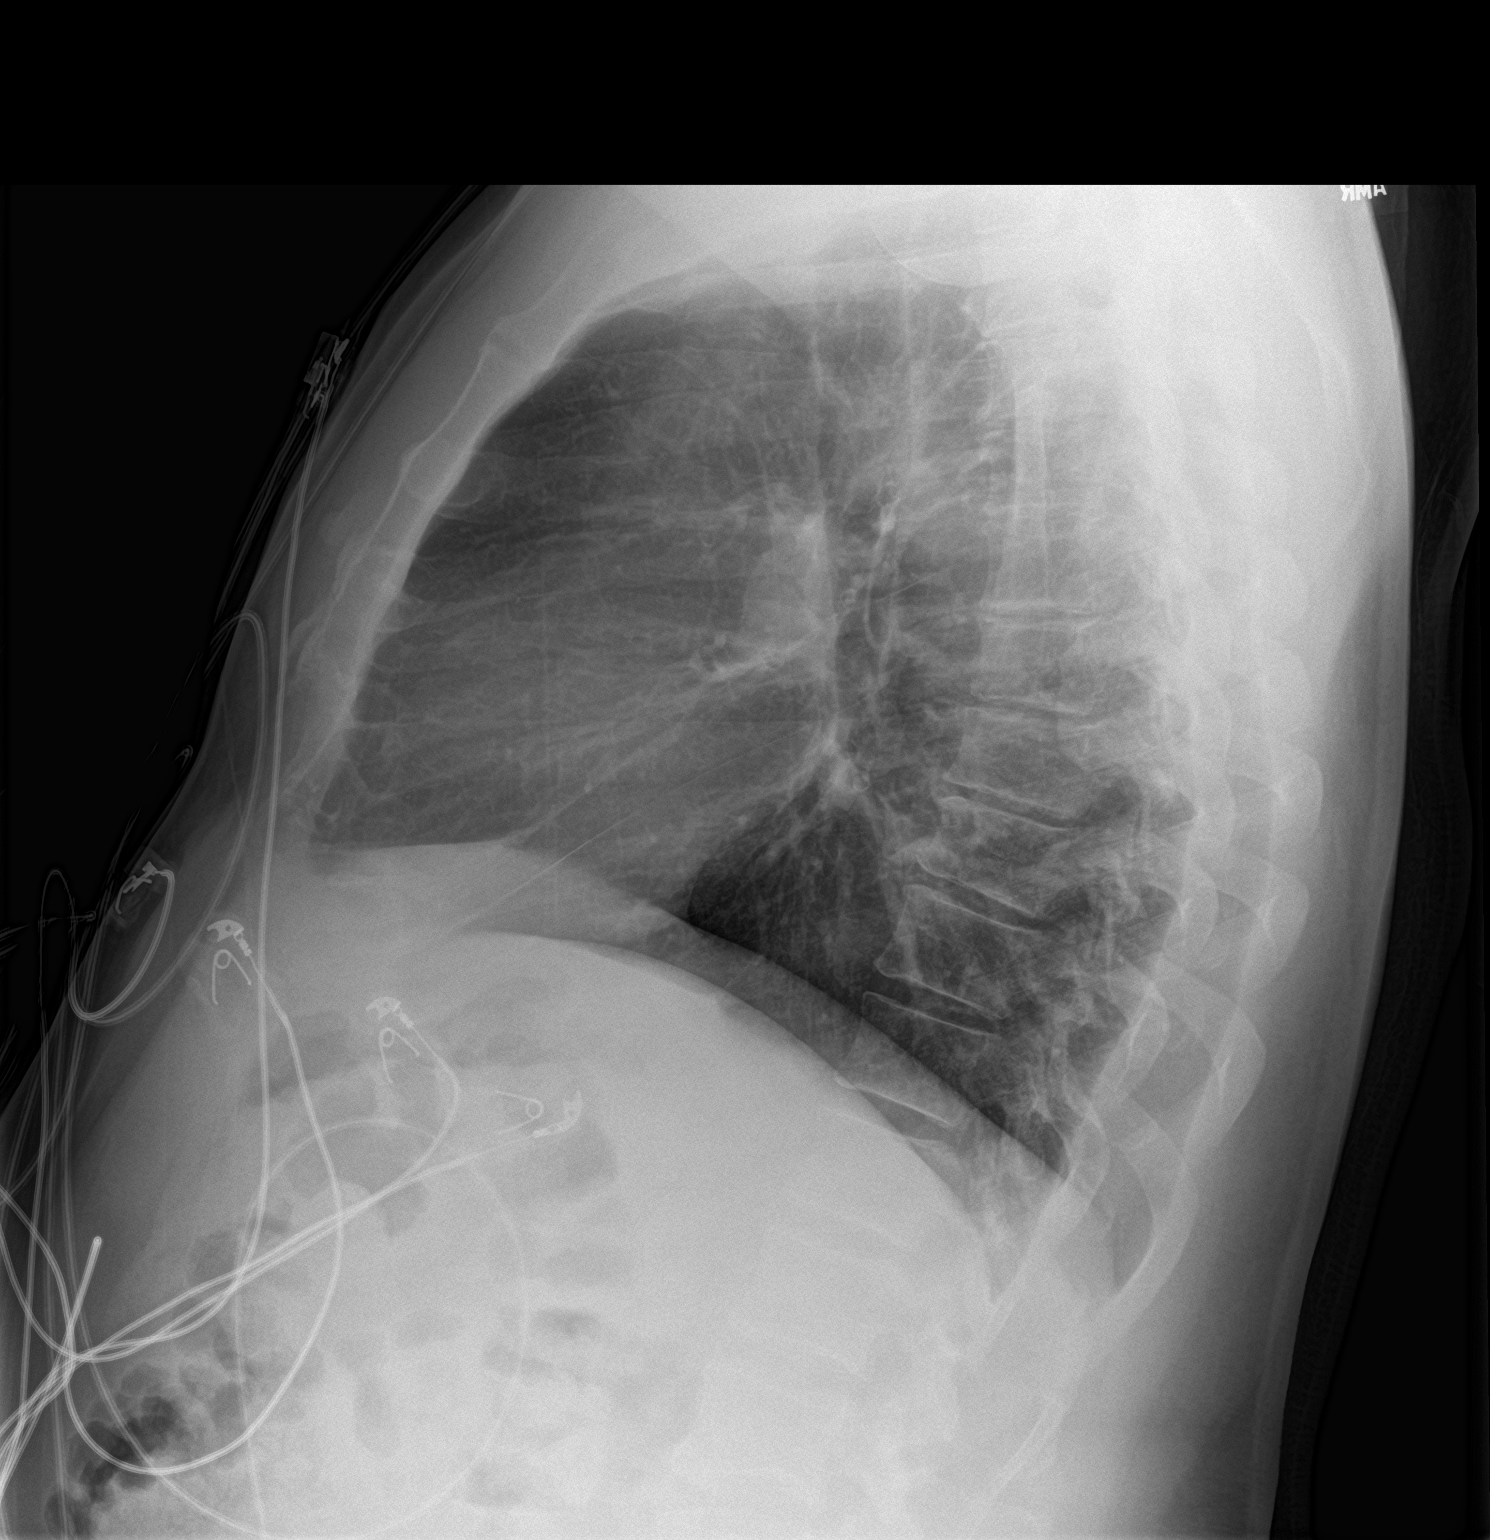

[2 of 2 positions shown; findings below may reference images not displayed]

FINDINGS: The heart size and mediastinal contours are within normal limits.
Both lungs are clear. The visualized skeletal structures are
unremarkable.
IMPRESSION: No active cardiopulmonary disease.

## 2023-11-01 ENCOUNTER — Encounter: Payer: Self-pay | Admitting: Radiology

## 2023-12-07 IMAGING — DX DG SHOULDER 2+V*R*
2 series · 2 of 2 positions shown · non-contrast
Comparison: None Available.

CLINICAL DATA: Right shoulder pain for several weeks, no known
injury

EXAM:
RIGHT SHOULDER - 2+ VIEW

[shoulder grashey]
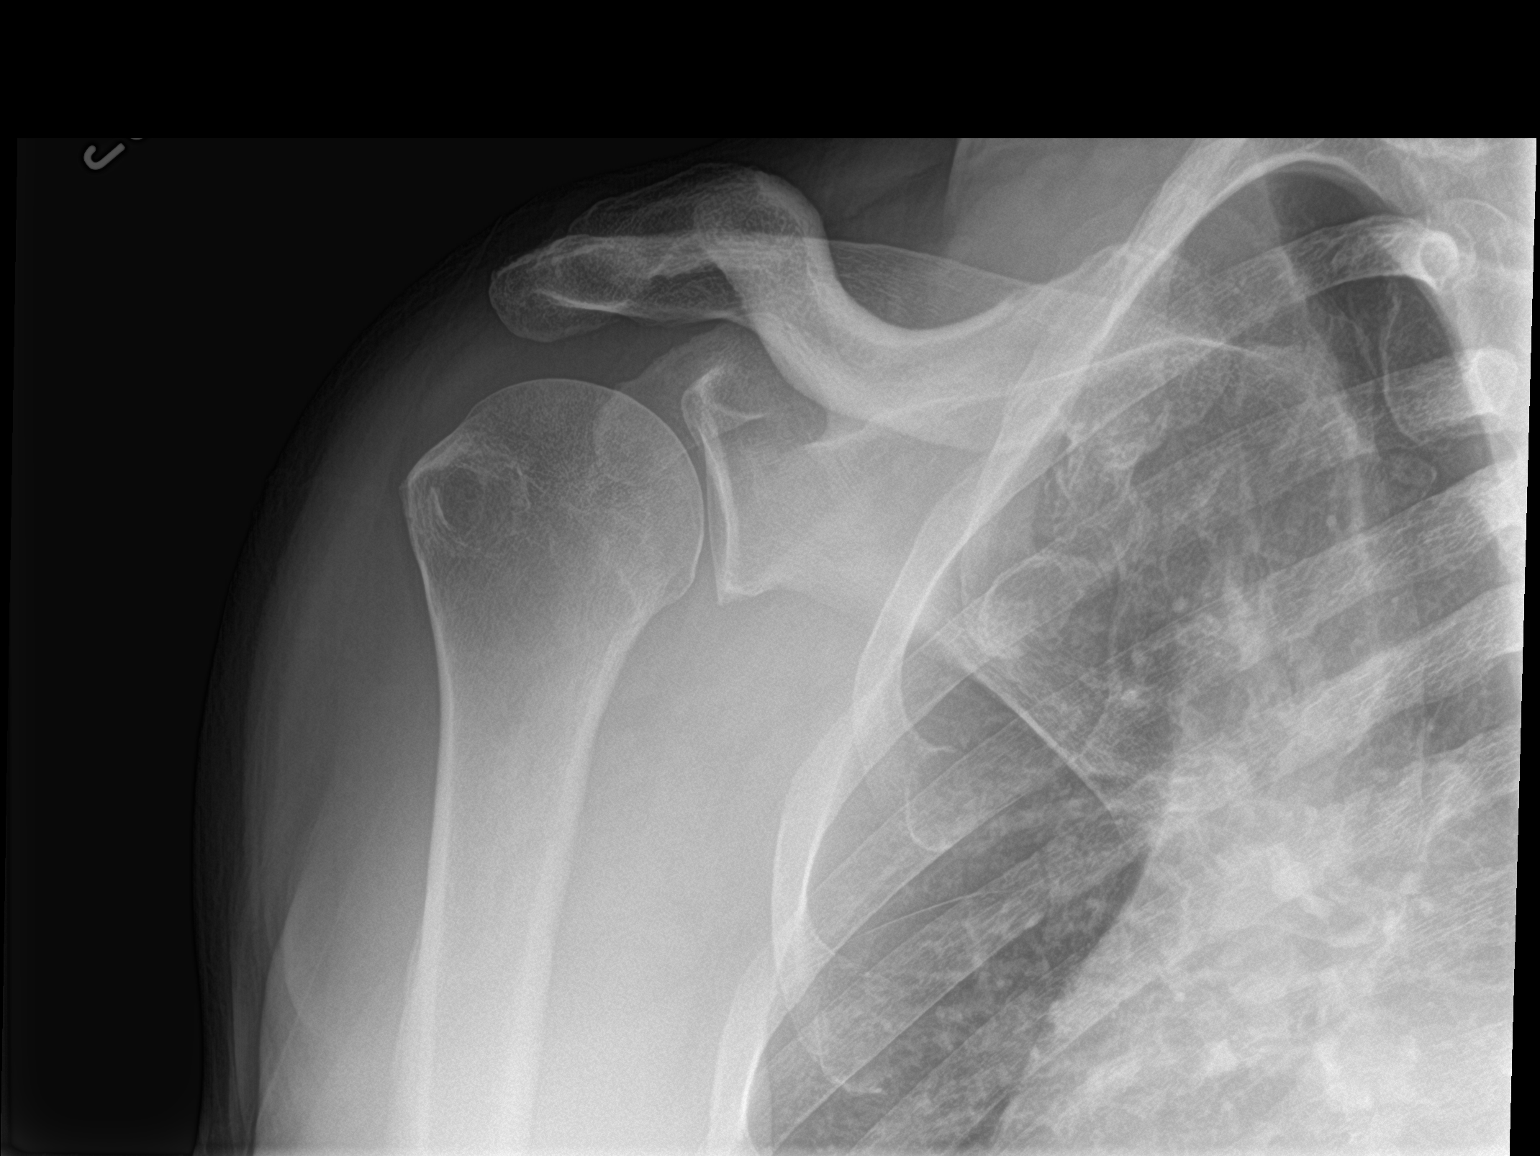

[shoulder y view]
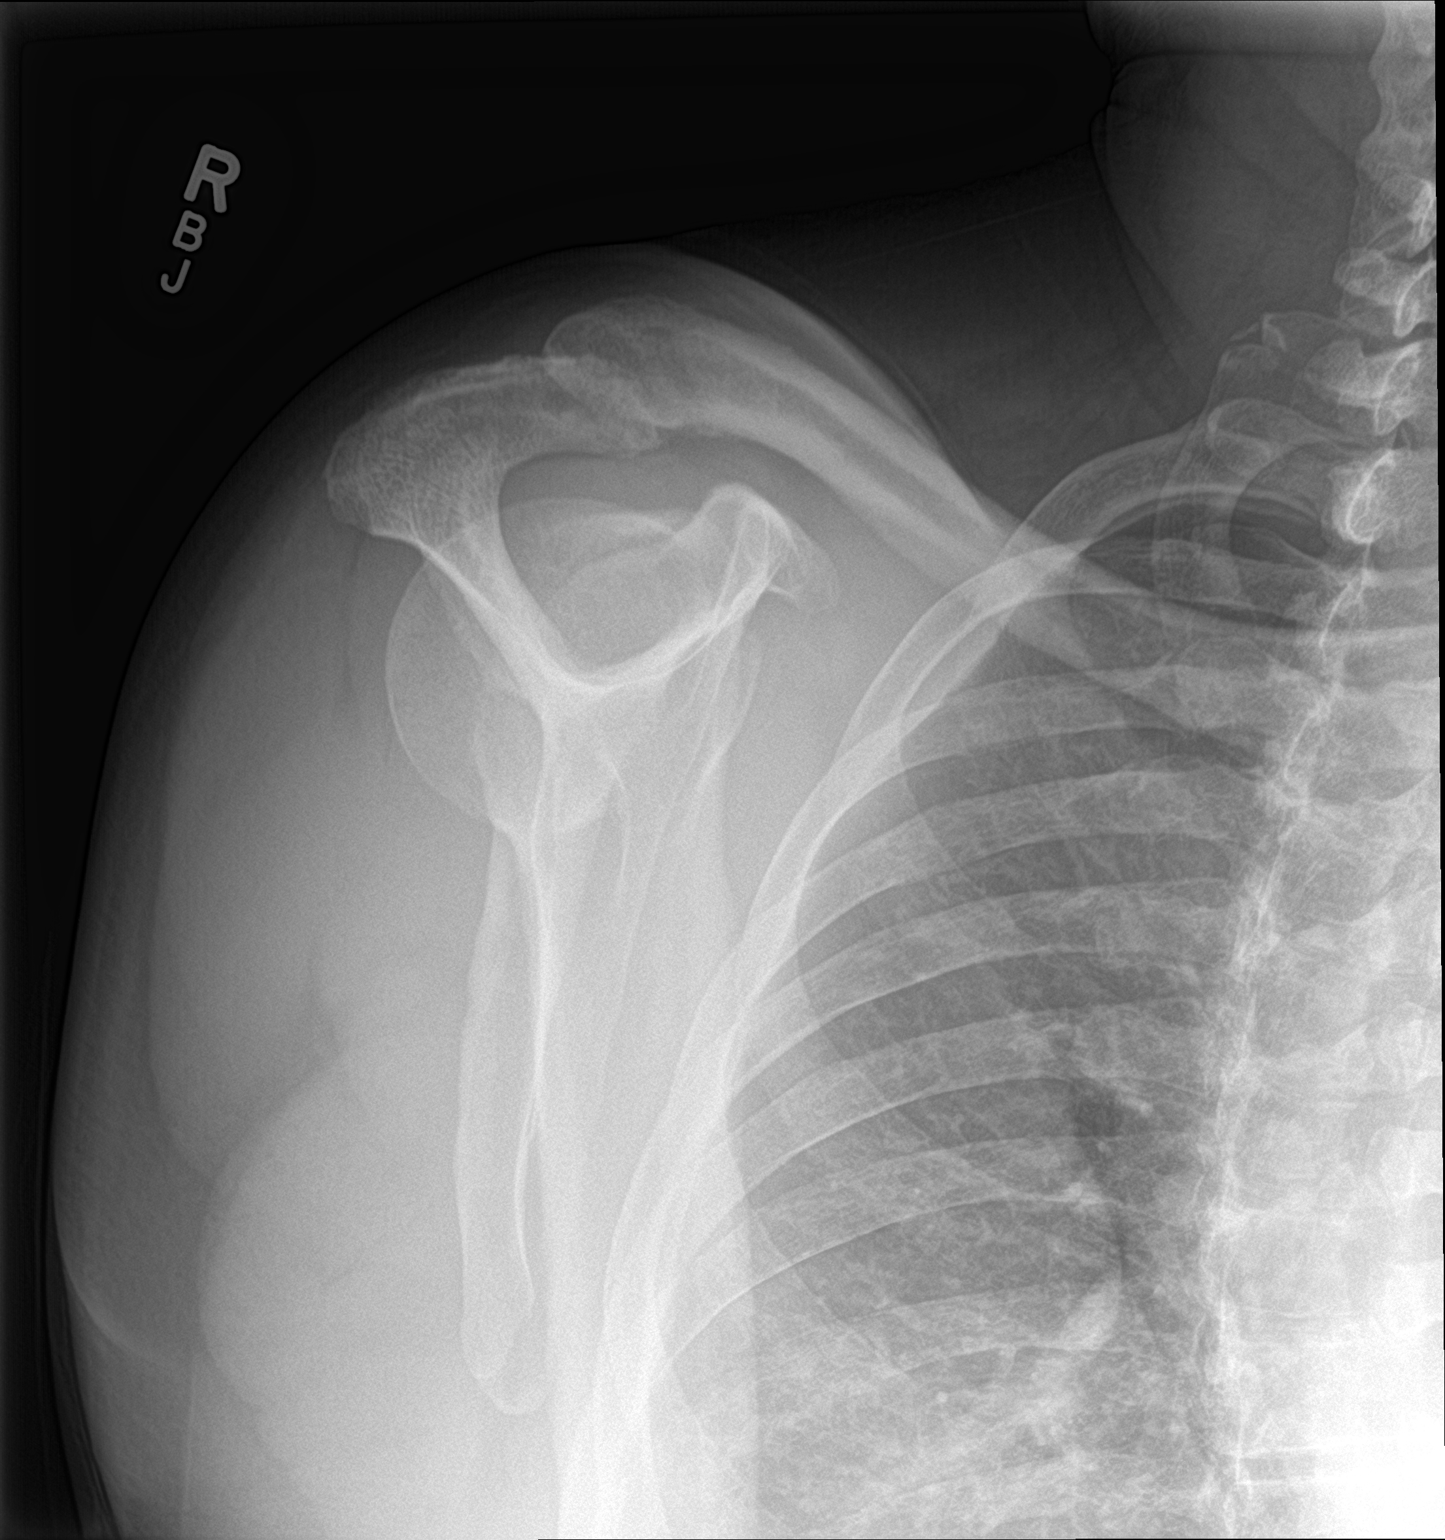

[2 of 2 positions shown; findings below may reference images not displayed]

FINDINGS: There is no evidence of fracture or dislocation. There is no
evidence of arthropathy or other focal bone abnormality. Soft
tissues are unremarkable.
IMPRESSION: No fracture or dislocation of the right shoulder. Joint spaces are
preserved.

## 2023-12-27 ENCOUNTER — Ambulatory Visit: Payer: Self-pay

## 2024-01-13 ENCOUNTER — Encounter: Payer: Self-pay | Admitting: Radiology

## 2024-03-26 ENCOUNTER — Other Ambulatory Visit (HOSPITAL_COMMUNITY): Payer: Self-pay

## 2024-03-26 ENCOUNTER — Telehealth: Payer: Self-pay | Admitting: Pharmacy Technician

## 2024-03-26 NOTE — Telephone Encounter (Addendum)
 Pharmacy Patient Advocate Encounter   Received notification from [facility] that prior authorization for [facility] 3+ is due for renewal.   Insurance verification completed.   The patient is insured through [employer] [place] [employer].  Action: PA required; However, NEW/RECENT labs/notes are needed to complete & submit PA request. Please see below.  Has the member been using the continuous glucose monitor (CGM) system as prescribed(documentation is required to be submitted for an approval)  Also, a new prescription will need to be sent for the [place] 3 +

## 2024-03-27 ENCOUNTER — Other Ambulatory Visit: Payer: Self-pay

## 2024-03-27 DIAGNOSIS — E1165 Type 2 diabetes mellitus with hyperglycemia: Secondary | ICD-10-CM

## 2024-03-27 MED ORDER — FREESTYLE LIBRE 3 PLUS SENSOR MISC: 3 refills | Status: AC

## 2024-03-27 NOTE — Telephone Encounter (Signed)
 Freestyle libre reordered

## 2024-04-06 ENCOUNTER — Other Ambulatory Visit (HOSPITAL_COMMUNITY): Payer: Self-pay

## 2024-04-06 NOTE — Telephone Encounter (Signed)
 Thank you for sending the new prescription in. Unfortunately we're missing some documentation to request a new prior authorization. The question below is from the insurance for the PA:  Has the member been using the continuous glucose monitor (CGM) system as prescribed(documentation is required to be submitted for an approval)   He will likely need a visit for this, unless you can call and check, then do a d/l on his system to show he's using it. However, from what I can see he only filled it twice. 10/01/23 & 10/26/23
